# Patient Record
Sex: Female | Born: 1955 | Race: Black or African American | Hispanic: No | Smoking: Never smoker
Health system: Southern US, Community
[De-identification: ages and names within clinical notes are randomized; demographics above are authoritative.]

## PROBLEM LIST (undated history)

## (undated) DIAGNOSIS — T7840XA Allergy, unspecified, initial encounter: Secondary | ICD-10-CM

## (undated) DIAGNOSIS — I219 Acute myocardial infarction, unspecified: Secondary | ICD-10-CM

## (undated) DIAGNOSIS — I509 Heart failure, unspecified: Secondary | ICD-10-CM

## (undated) DIAGNOSIS — T8859XA Other complications of anesthesia, initial encounter: Secondary | ICD-10-CM

## (undated) DIAGNOSIS — G709 Myoneural disorder, unspecified: Secondary | ICD-10-CM

## (undated) DIAGNOSIS — N189 Chronic kidney disease, unspecified: Secondary | ICD-10-CM

## (undated) DIAGNOSIS — I2510 Atherosclerotic heart disease of native coronary artery without angina pectoris: Secondary | ICD-10-CM

## (undated) DIAGNOSIS — D649 Anemia, unspecified: Secondary | ICD-10-CM

## (undated) DIAGNOSIS — I5020 Unspecified systolic (congestive) heart failure: Secondary | ICD-10-CM

## (undated) DIAGNOSIS — E785 Hyperlipidemia, unspecified: Secondary | ICD-10-CM

## (undated) DIAGNOSIS — H269 Unspecified cataract: Secondary | ICD-10-CM

## (undated) DIAGNOSIS — T4145XA Adverse effect of unspecified anesthetic, initial encounter: Secondary | ICD-10-CM

## (undated) DIAGNOSIS — I428 Other cardiomyopathies: Secondary | ICD-10-CM

## (undated) DIAGNOSIS — I739 Peripheral vascular disease, unspecified: Secondary | ICD-10-CM

## (undated) DIAGNOSIS — I499 Cardiac arrhythmia, unspecified: Secondary | ICD-10-CM

## (undated) DIAGNOSIS — I10 Essential (primary) hypertension: Secondary | ICD-10-CM

## (undated) DIAGNOSIS — I82409 Acute embolism and thrombosis of unspecified deep veins of unspecified lower extremity: Secondary | ICD-10-CM

## (undated) DIAGNOSIS — E669 Obesity, unspecified: Secondary | ICD-10-CM

## (undated) HISTORY — DX: Allergy, unspecified, initial encounter: T78.40XA

## (undated) HISTORY — DX: Obesity, unspecified: E66.9

## (undated) HISTORY — PX: OTHER SURGICAL HISTORY: SHX169

## (undated) HISTORY — PX: WISDOM TOOTH EXTRACTION: SHX21

## (undated) HISTORY — DX: Unspecified systolic (congestive) heart failure: I50.20

## (undated) HISTORY — PX: COLONOSCOPY: SHX174

## (undated) HISTORY — PX: NO PAST SURGERIES: SHX2092

## (undated) HISTORY — DX: Atherosclerotic heart disease of native coronary artery without angina pectoris: I25.10

## (undated) HISTORY — DX: Anemia, unspecified: D64.9

## (undated) HISTORY — DX: Hyperlipidemia, unspecified: E78.5

## (undated) HISTORY — DX: Unspecified cataract: H26.9

## (undated) HISTORY — DX: Acute embolism and thrombosis of unspecified deep veins of unspecified lower extremity: I82.409

## (undated) HISTORY — DX: Other cardiomyopathies: I42.8

---

## 2001-12-17 ENCOUNTER — Encounter: Admission: RE | Admit: 2001-12-17 | Discharge: 2001-12-17 | Payer: Self-pay | Admitting: Internal Medicine

## 2001-12-17 ENCOUNTER — Encounter: Payer: Self-pay | Admitting: Internal Medicine

## 2002-01-20 ENCOUNTER — Other Ambulatory Visit: Admission: RE | Admit: 2002-01-20 | Discharge: 2002-01-20 | Payer: Self-pay | Admitting: Obstetrics and Gynecology

## 2002-01-24 ENCOUNTER — Encounter: Admission: RE | Admit: 2002-01-24 | Discharge: 2002-04-24 | Payer: Self-pay | Admitting: Internal Medicine

## 2003-09-12 ENCOUNTER — Encounter: Admission: RE | Admit: 2003-09-12 | Discharge: 2003-09-12 | Payer: Self-pay | Admitting: Internal Medicine

## 2004-04-16 ENCOUNTER — Emergency Department (HOSPITAL_COMMUNITY): Admission: EM | Admit: 2004-04-16 | Discharge: 2004-04-16 | Payer: Self-pay | Admitting: Emergency Medicine

## 2005-11-28 ENCOUNTER — Observation Stay (HOSPITAL_COMMUNITY): Admission: EM | Admit: 2005-11-28 | Discharge: 2005-11-29 | Payer: Self-pay | Admitting: Emergency Medicine

## 2005-11-29 ENCOUNTER — Encounter (INDEPENDENT_AMBULATORY_CARE_PROVIDER_SITE_OTHER): Payer: Self-pay | Admitting: Infectious Diseases

## 2005-12-03 ENCOUNTER — Ambulatory Visit: Payer: Self-pay | Admitting: Family Medicine

## 2007-01-09 ENCOUNTER — Emergency Department (HOSPITAL_COMMUNITY): Admission: EM | Admit: 2007-01-09 | Discharge: 2007-01-09 | Payer: Self-pay | Admitting: Emergency Medicine

## 2007-01-11 ENCOUNTER — Emergency Department (HOSPITAL_COMMUNITY): Admission: EM | Admit: 2007-01-11 | Discharge: 2007-01-11 | Payer: Self-pay | Admitting: Emergency Medicine

## 2007-01-18 ENCOUNTER — Ambulatory Visit: Payer: Self-pay | Admitting: Internal Medicine

## 2007-01-18 DIAGNOSIS — I10 Essential (primary) hypertension: Secondary | ICD-10-CM | POA: Insufficient documentation

## 2007-02-03 ENCOUNTER — Ambulatory Visit (HOSPITAL_COMMUNITY): Admission: RE | Admit: 2007-02-03 | Discharge: 2007-02-03 | Payer: Self-pay | Admitting: Internal Medicine

## 2007-02-03 ENCOUNTER — Encounter (INDEPENDENT_AMBULATORY_CARE_PROVIDER_SITE_OTHER): Payer: Self-pay | Admitting: Infectious Diseases

## 2007-02-03 ENCOUNTER — Ambulatory Visit: Payer: Self-pay | Admitting: Cardiology

## 2007-02-03 ENCOUNTER — Ambulatory Visit: Payer: Self-pay | Admitting: Internal Medicine

## 2007-02-03 ENCOUNTER — Encounter (INDEPENDENT_AMBULATORY_CARE_PROVIDER_SITE_OTHER): Payer: Self-pay | Admitting: Internal Medicine

## 2007-02-04 ENCOUNTER — Encounter (INDEPENDENT_AMBULATORY_CARE_PROVIDER_SITE_OTHER): Payer: Self-pay | Admitting: Infectious Diseases

## 2007-02-04 LAB — CONVERTED CEMR LAB
ALT: 17 units/L (ref 0–35)
AST: 14 units/L (ref 0–37)
Albumin: 4.5 g/dL (ref 3.5–5.2)
Amphetamine Screen, Ur: NEGATIVE
Benzodiazepines.: NEGATIVE
Calcium: 10.3 mg/dL (ref 8.4–10.5)
Chloride: 103 meq/L (ref 96–112)
Creatinine, Ser: 1.39 mg/dL — ABNORMAL HIGH (ref 0.40–1.20)
Creatinine, Urine: 200.9 mg/dL
Creatinine,U: 198.8 mg/dL
Methadone: NEGATIVE
Microalb Creat Ratio: 4.9 mg/g (ref 0.0–30.0)
Potassium: 3.8 meq/L (ref 3.5–5.3)
TSH: 2.809 microintl units/mL (ref 0.350–5.50)
Total CHOL/HDL Ratio: 4.3

## 2007-02-08 ENCOUNTER — Ambulatory Visit: Payer: Self-pay | Admitting: Internal Medicine

## 2007-02-10 ENCOUNTER — Encounter (INDEPENDENT_AMBULATORY_CARE_PROVIDER_SITE_OTHER): Payer: Self-pay | Admitting: Internal Medicine

## 2007-02-10 ENCOUNTER — Ambulatory Visit: Payer: Self-pay | Admitting: Internal Medicine

## 2007-02-11 ENCOUNTER — Encounter: Admission: RE | Admit: 2007-02-11 | Discharge: 2007-02-11 | Payer: Self-pay | Admitting: Internal Medicine

## 2007-02-11 ENCOUNTER — Encounter: Payer: Self-pay | Admitting: Internal Medicine

## 2007-02-12 ENCOUNTER — Encounter (INDEPENDENT_AMBULATORY_CARE_PROVIDER_SITE_OTHER): Payer: Self-pay | Admitting: Internal Medicine

## 2007-02-12 LAB — CONVERTED CEMR LAB
ALT: 16 units/L (ref 0–35)
AST: 16 units/L (ref 0–37)
Albumin: 4.2 g/dL (ref 3.5–5.2)
Alkaline Phosphatase: 71 units/L (ref 39–117)
Glucose, Bld: 150 mg/dL — ABNORMAL HIGH (ref 70–99)
Potassium: 3.9 meq/L (ref 3.5–5.3)
Sodium: 138 meq/L (ref 135–145)
Total Protein: 7.2 g/dL (ref 6.0–8.3)

## 2007-02-22 ENCOUNTER — Ambulatory Visit: Payer: Self-pay | Admitting: Internal Medicine

## 2007-03-18 ENCOUNTER — Telehealth (INDEPENDENT_AMBULATORY_CARE_PROVIDER_SITE_OTHER): Payer: Self-pay | Admitting: *Deleted

## 2007-03-25 ENCOUNTER — Encounter (INDEPENDENT_AMBULATORY_CARE_PROVIDER_SITE_OTHER): Payer: Self-pay | Admitting: *Deleted

## 2007-03-25 ENCOUNTER — Ambulatory Visit: Payer: Self-pay | Admitting: Internal Medicine

## 2007-03-25 LAB — CONVERTED CEMR LAB
Albumin: 4.4 g/dL (ref 3.5–5.2)
BUN: 23 mg/dL (ref 6–23)
CO2: 26 meq/L (ref 19–32)
Calcium: 9.3 mg/dL (ref 8.4–10.5)
Chloride: 104 meq/L (ref 96–112)
Glucose, Bld: 128 mg/dL — ABNORMAL HIGH (ref 70–99)
HDL: 55 mg/dL (ref >39)
Hgb A1c MFr Bld: 7.7 %
Potassium: 4 meq/L (ref 3.5–5.3)
Total Protein: 7.2 g/dL (ref 6.0–8.3)
Triglycerides: 92 mg/dL (ref <150)

## 2007-12-09 ENCOUNTER — Ambulatory Visit: Payer: Self-pay | Admitting: Cardiology

## 2007-12-09 ENCOUNTER — Inpatient Hospital Stay (HOSPITAL_COMMUNITY): Admission: EM | Admit: 2007-12-09 | Discharge: 2007-12-10 | Payer: Self-pay | Admitting: Emergency Medicine

## 2007-12-09 ENCOUNTER — Ambulatory Visit: Payer: Self-pay | Admitting: *Deleted

## 2007-12-09 ENCOUNTER — Encounter (INDEPENDENT_AMBULATORY_CARE_PROVIDER_SITE_OTHER): Payer: Self-pay | Admitting: *Deleted

## 2007-12-09 LAB — CONVERTED CEMR LAB
Cholesterol: 177 mg/dL
Hgb A1c MFr Bld: 7.5 %
LDL Cholesterol: 112 mg/dL

## 2007-12-10 ENCOUNTER — Encounter (INDEPENDENT_AMBULATORY_CARE_PROVIDER_SITE_OTHER): Payer: Self-pay | Admitting: Emergency Medicine

## 2007-12-10 ENCOUNTER — Ambulatory Visit: Payer: Self-pay | Admitting: Vascular Surgery

## 2007-12-10 ENCOUNTER — Encounter (INDEPENDENT_AMBULATORY_CARE_PROVIDER_SITE_OTHER): Payer: Self-pay | Admitting: Internal Medicine

## 2007-12-20 ENCOUNTER — Encounter (INDEPENDENT_AMBULATORY_CARE_PROVIDER_SITE_OTHER): Payer: Self-pay | Admitting: *Deleted

## 2007-12-20 ENCOUNTER — Ambulatory Visit: Payer: Self-pay | Admitting: Internal Medicine

## 2007-12-20 LAB — CONVERTED CEMR LAB
ALT: 40 units/L — ABNORMAL HIGH (ref 0–35)
AST: 27 units/L (ref 0–37)
BUN: 23 mg/dL (ref 6–23)
CO2: 26 meq/L (ref 19–32)
Chloride: 103 meq/L (ref 96–112)
Creatinine, Ser: 1.33 mg/dL — ABNORMAL HIGH (ref 0.40–1.20)
Indirect Bilirubin: 0.7 mg/dL (ref 0.0–0.9)
Potassium: 3.7 meq/L (ref 3.5–5.3)
Total Protein: 6.3 g/dL (ref 6.0–8.3)

## 2007-12-22 ENCOUNTER — Encounter (INDEPENDENT_AMBULATORY_CARE_PROVIDER_SITE_OTHER): Payer: Self-pay | Admitting: *Deleted

## 2007-12-22 ENCOUNTER — Telehealth (INDEPENDENT_AMBULATORY_CARE_PROVIDER_SITE_OTHER): Payer: Self-pay | Admitting: *Deleted

## 2008-02-08 ENCOUNTER — Ambulatory Visit: Payer: Self-pay | Admitting: Internal Medicine

## 2008-02-08 LAB — CONVERTED CEMR LAB: Blood Glucose, Fingerstick: 88

## 2009-02-18 IMAGING — CR DG CHEST 2V
2 series · 2 of 2 positions shown · non-contrast
Comparison: None

CLINICAL DATA: Shortness of breath. Coughing.

CHEST - 2 VIEW

[w chest pa]
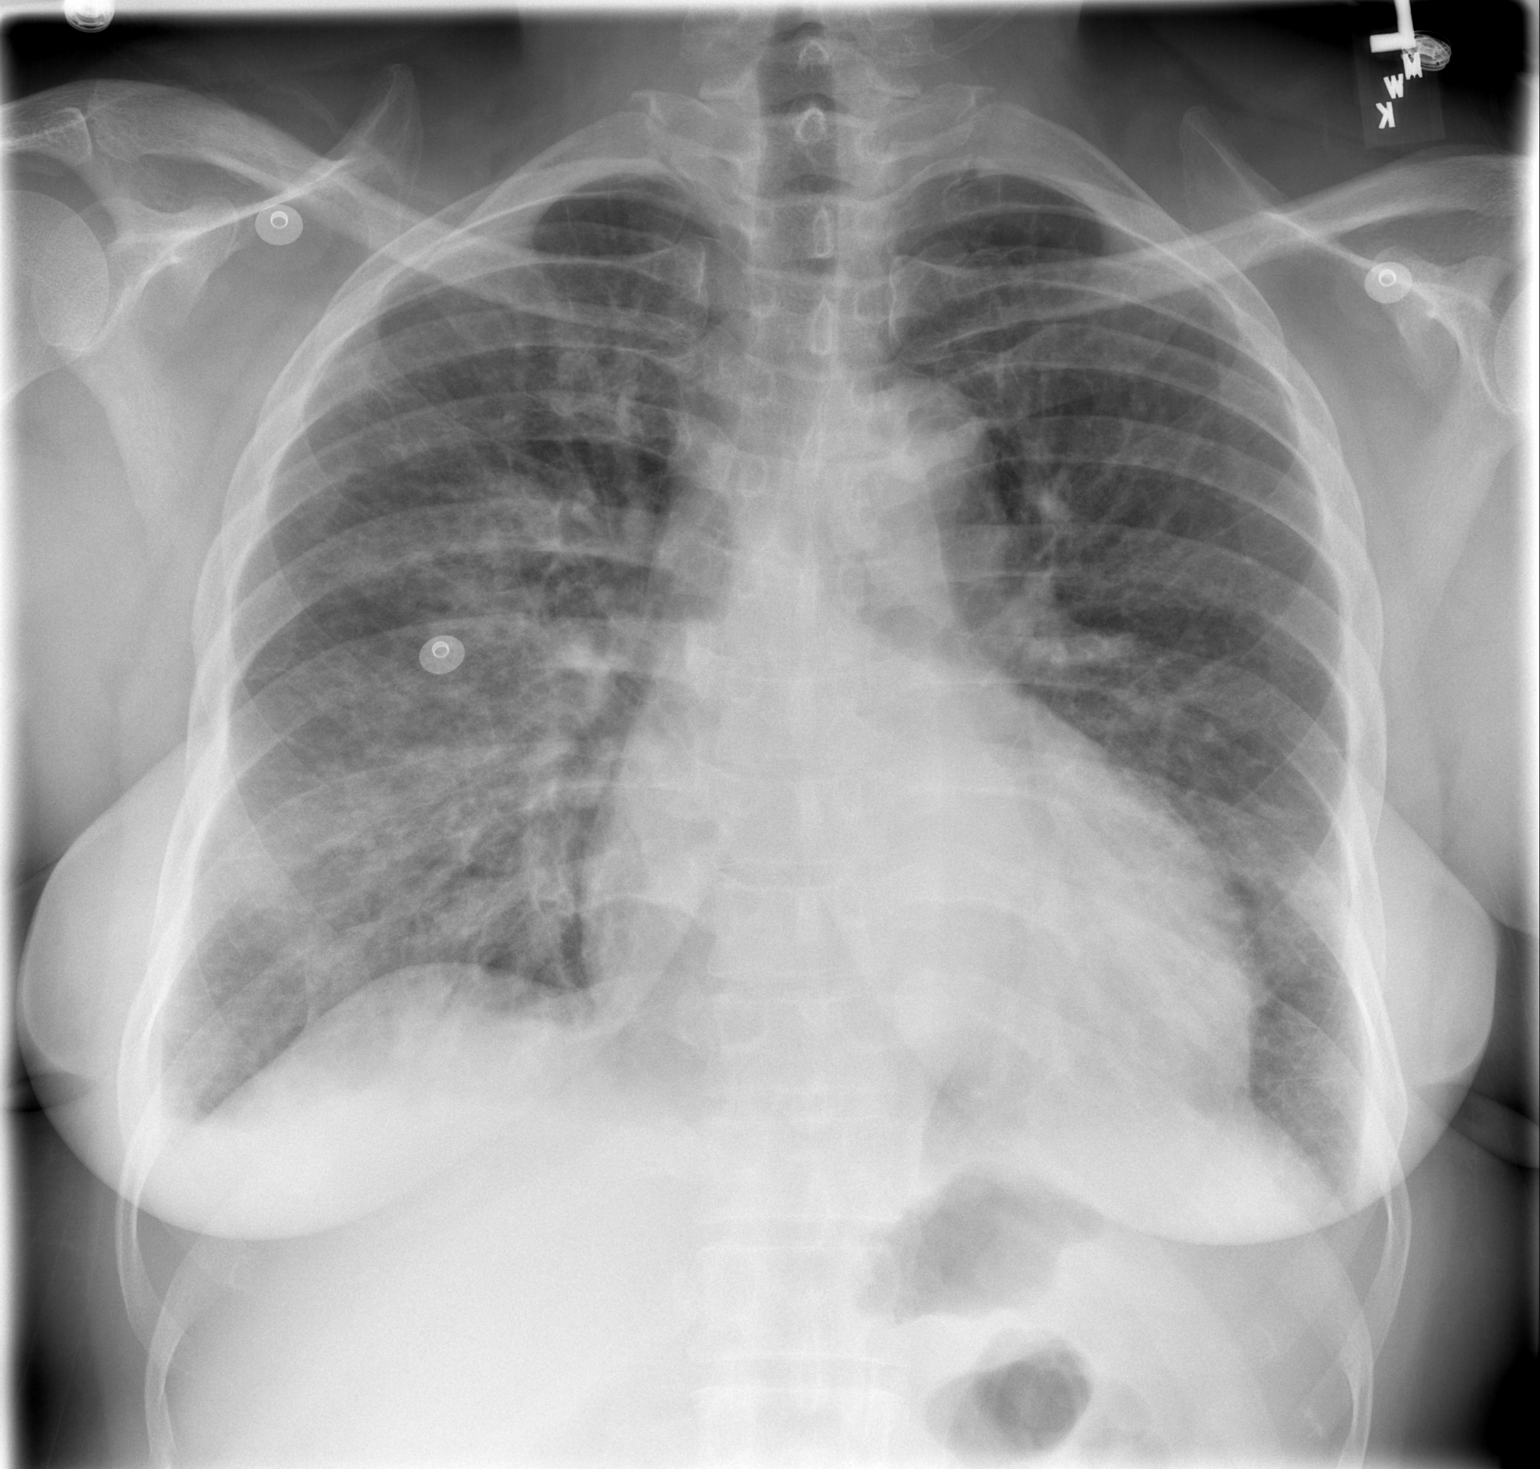

[w chest lat]
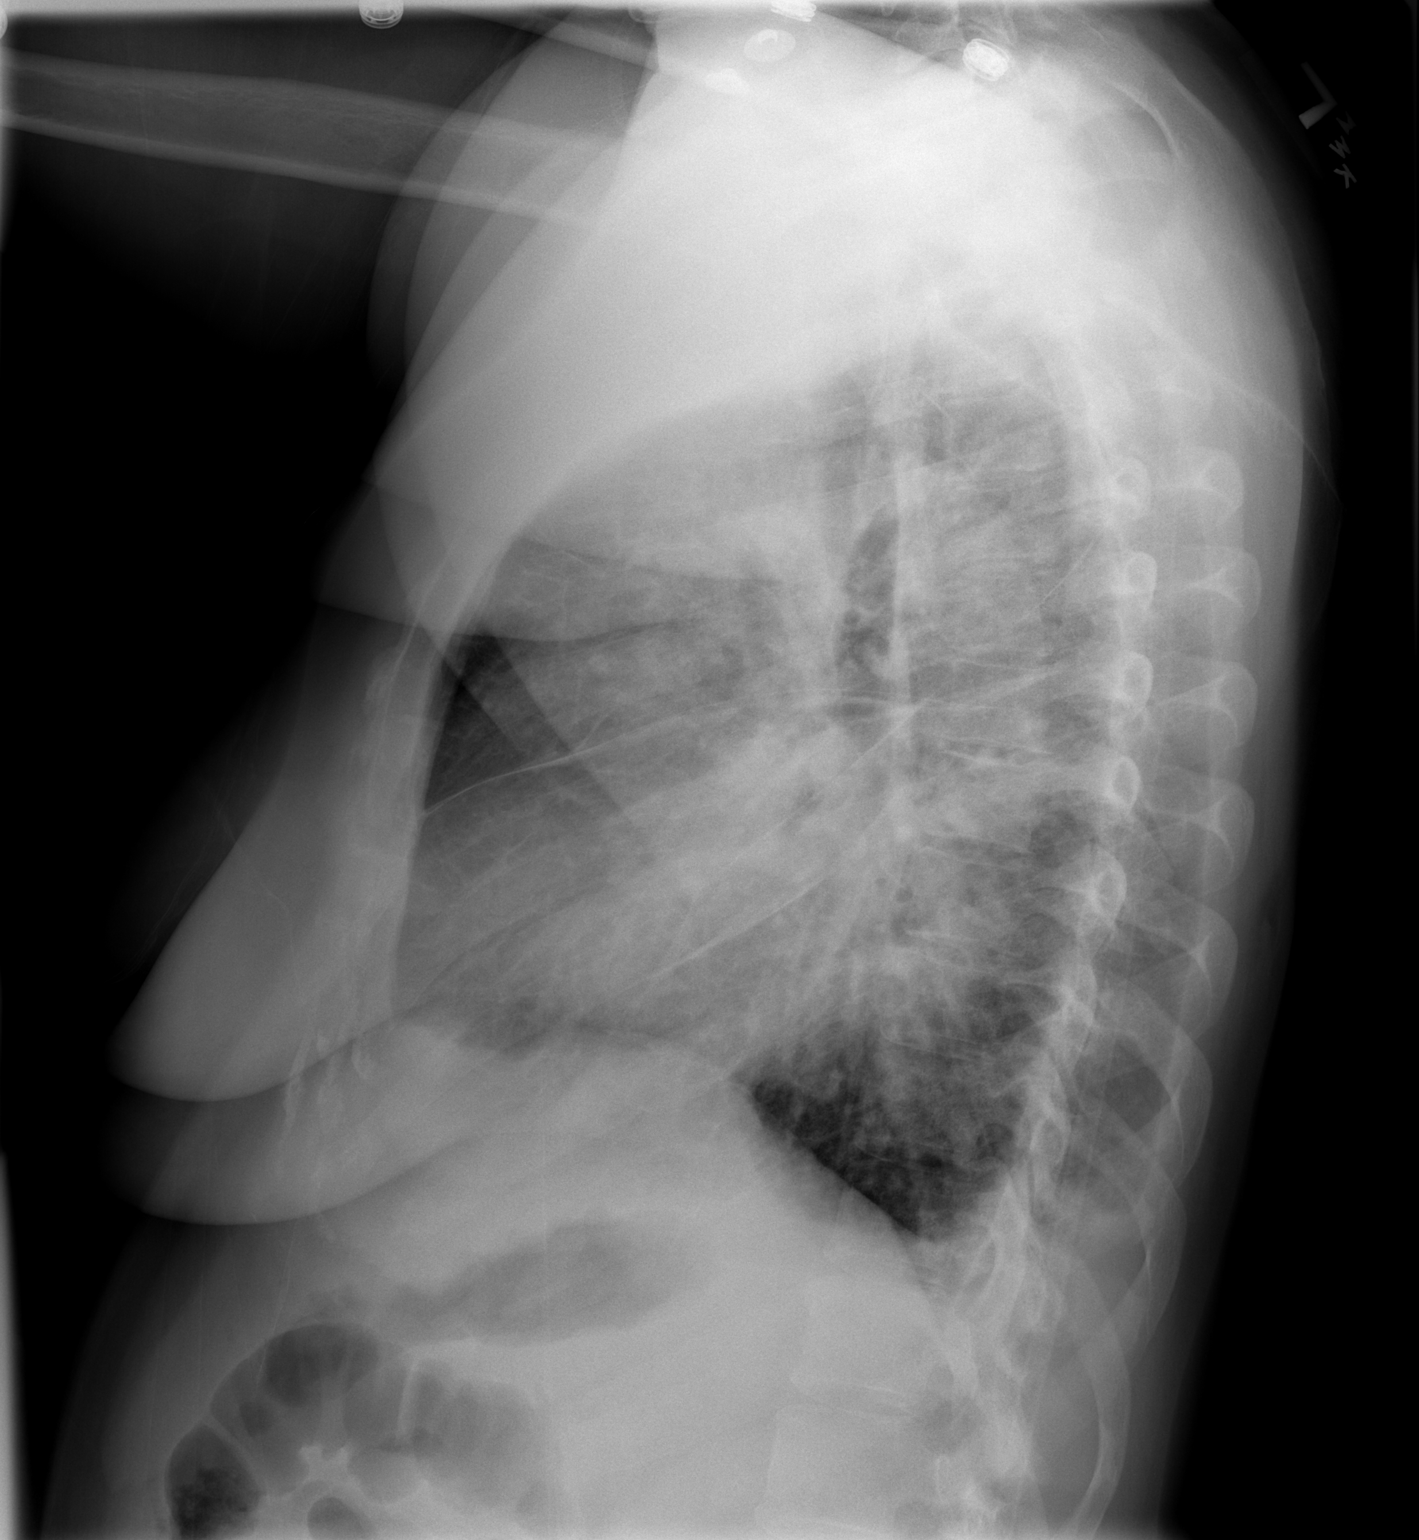

[2 of 2 positions shown; findings below may reference images not displayed]

FINDINGS: Cardiomegaly is present along with bilateral interstitial opacities
and right perihilar and left basilar air space opacities. A small right pleural
effusion is present. Kerley B-lines are noted. The appearance favors acute
pulmonary edema over bilateral pneumonia.

IMPRESSION

1. Acute pulmonary edema.  Small right pleural effusion.

## 2009-05-17 ENCOUNTER — Emergency Department (HOSPITAL_COMMUNITY): Admission: EM | Admit: 2009-05-17 | Discharge: 2009-05-17 | Payer: Self-pay | Admitting: Emergency Medicine

## 2009-08-27 ENCOUNTER — Encounter: Payer: Self-pay | Admitting: Internal Medicine

## 2010-09-17 NOTE — Miscellaneous (Signed)
Summary: [employer]: Diabetes [employer]: Diabetes Testing Supplies   Imported By: Florinda Marker 08/28/2009 13:52:46  _____________________________________________________________________  External Attachment:    Type:   Image     Comment:   External Document

## 2010-09-29 ENCOUNTER — Emergency Department (HOSPITAL_COMMUNITY): Payer: Managed Care, Other (non HMO)

## 2010-09-29 ENCOUNTER — Emergency Department (HOSPITAL_COMMUNITY)
Admission: EM | Admit: 2010-09-29 | Discharge: 2010-09-30 | Disposition: A | Discharge status: Home / Self Care | Payer: Managed Care, Other (non HMO) | Attending: Emergency Medicine | Admitting: Emergency Medicine

## 2010-09-29 DIAGNOSIS — R062 Wheezing: Secondary | ICD-10-CM | POA: Insufficient documentation

## 2010-09-29 DIAGNOSIS — I509 Heart failure, unspecified: Secondary | ICD-10-CM | POA: Insufficient documentation

## 2010-09-29 DIAGNOSIS — J209 Acute bronchitis, unspecified: Secondary | ICD-10-CM | POA: Insufficient documentation

## 2010-09-29 DIAGNOSIS — E119 Type 2 diabetes mellitus without complications: Secondary | ICD-10-CM | POA: Insufficient documentation

## 2010-09-29 DIAGNOSIS — I10 Essential (primary) hypertension: Secondary | ICD-10-CM | POA: Insufficient documentation

## 2010-09-29 DIAGNOSIS — R0602 Shortness of breath: Secondary | ICD-10-CM | POA: Insufficient documentation

## 2010-09-29 DIAGNOSIS — E785 Hyperlipidemia, unspecified: Secondary | ICD-10-CM | POA: Insufficient documentation

## 2010-09-30 ENCOUNTER — Emergency Department (HOSPITAL_COMMUNITY): Payer: Managed Care, Other (non HMO)

## 2010-09-30 ENCOUNTER — Inpatient Hospital Stay (HOSPITAL_COMMUNITY)
Admission: EM | Admit: 2010-09-30 | Discharge: 2010-10-04 | DRG: 280 | Disposition: A | Discharge status: Home / Self Care | Payer: Managed Care, Other (non HMO) | Attending: Internal Medicine | Admitting: Internal Medicine

## 2010-09-30 ENCOUNTER — Encounter: Payer: Self-pay | Admitting: Internal Medicine

## 2010-09-30 ENCOUNTER — Encounter (HOSPITAL_COMMUNITY): Payer: Self-pay | Admitting: Radiology

## 2010-09-30 DIAGNOSIS — N289 Disorder of kidney and ureter, unspecified: Secondary | ICD-10-CM | POA: Diagnosis not present

## 2010-09-30 DIAGNOSIS — I5023 Acute on chronic systolic (congestive) heart failure: Secondary | ICD-10-CM | POA: Diagnosis present

## 2010-09-30 DIAGNOSIS — Z7982 Long term (current) use of aspirin: Secondary | ICD-10-CM

## 2010-09-30 DIAGNOSIS — I428 Other cardiomyopathies: Secondary | ICD-10-CM | POA: Diagnosis present

## 2010-09-30 DIAGNOSIS — R079 Chest pain, unspecified: Secondary | ICD-10-CM

## 2010-09-30 DIAGNOSIS — I2582 Chronic total occlusion of coronary artery: Secondary | ICD-10-CM | POA: Diagnosis present

## 2010-09-30 DIAGNOSIS — E119 Type 2 diabetes mellitus without complications: Secondary | ICD-10-CM | POA: Diagnosis present

## 2010-09-30 DIAGNOSIS — E785 Hyperlipidemia, unspecified: Secondary | ICD-10-CM | POA: Diagnosis present

## 2010-09-30 DIAGNOSIS — I509 Heart failure, unspecified: Secondary | ICD-10-CM | POA: Diagnosis present

## 2010-09-30 DIAGNOSIS — I214 Non-ST elevation (NSTEMI) myocardial infarction: Principal | ICD-10-CM | POA: Diagnosis present

## 2010-09-30 DIAGNOSIS — I110 Hypertensive heart disease with heart failure: Secondary | ICD-10-CM | POA: Diagnosis present

## 2010-09-30 DIAGNOSIS — I2510 Atherosclerotic heart disease of native coronary artery without angina pectoris: Secondary | ICD-10-CM | POA: Diagnosis present

## 2010-09-30 DIAGNOSIS — E669 Obesity, unspecified: Secondary | ICD-10-CM | POA: Diagnosis present

## 2010-09-30 DIAGNOSIS — Z79899 Other long term (current) drug therapy: Secondary | ICD-10-CM

## 2010-09-30 DIAGNOSIS — R0602 Shortness of breath: Secondary | ICD-10-CM

## 2010-09-30 HISTORY — DX: Essential (primary) hypertension: I10

## 2010-09-30 HISTORY — DX: Heart failure, unspecified: I50.9

## 2010-09-30 LAB — URINALYSIS, ROUTINE W REFLEX MICROSCOPIC
Bilirubin Urine: NEGATIVE
Hgb urine dipstick: NEGATIVE
Protein, ur: NEGATIVE mg/dL
Urobilinogen, UA: 0.2 mg/dL (ref 0.0–1.0)

## 2010-09-30 LAB — COMPREHENSIVE METABOLIC PANEL
ALT: 47 U/L — ABNORMAL HIGH (ref 0–35)
AST: 61 U/L — ABNORMAL HIGH (ref 0–37)
CO2: 26 mEq/L (ref 19–32)
Chloride: 104 mEq/L (ref 96–112)
Creatinine, Ser: 1.23 mg/dL — ABNORMAL HIGH (ref 0.4–1.2)
GFR calc Af Amer: 55 mL/min — ABNORMAL LOW (ref >60)
GFR calc non Af Amer: 46 mL/min — ABNORMAL LOW (ref >60)
Sodium: 141 mEq/L (ref 135–145)
Total Bilirubin: 0.8 mg/dL (ref 0.3–1.2)

## 2010-09-30 LAB — BASIC METABOLIC PANEL
BUN: 17 mg/dL (ref 6–23)
CO2: 25 mEq/L (ref 19–32)
Chloride: 105 mEq/L (ref 96–112)
GFR calc non Af Amer: 41 mL/min — ABNORMAL LOW (ref >60)
Glucose, Bld: 175 mg/dL — ABNORMAL HIGH (ref 70–99)
Potassium: 3.6 mEq/L (ref 3.5–5.1)
Sodium: 136 mEq/L (ref 135–145)

## 2010-09-30 LAB — CK TOTAL AND CKMB (NOT AT ARMC)
CK, MB: 5.2 ng/mL — ABNORMAL HIGH (ref 0.3–4.0)
Relative Index: INVALID (ref 0.0–2.5)
Total CK: 91 U/L (ref 7–177)

## 2010-09-30 LAB — CBC
HCT: 43.6 % (ref 36.0–46.0)
Hemoglobin: 15 g/dL (ref 12.0–15.0)
MCV: 84.8 fL (ref 78.0–100.0)
RDW: 13.6 % (ref 11.5–15.5)
WBC: 6.9 10*3/uL (ref 4.0–10.5)

## 2010-09-30 LAB — DIFFERENTIAL
Basophils Absolute: 0.1 10*3/uL (ref 0.0–0.1)
Eosinophils Relative: 3 % (ref 0–5)
Lymphocytes Relative: 27 % (ref 12–46)
Lymphs Abs: 1.9 10*3/uL (ref 0.7–4.0)
Neutro Abs: 4.4 10*3/uL (ref 1.7–7.7)

## 2010-09-30 LAB — CARDIAC PANEL(CRET KIN+CKTOT+MB+TROPI): Relative Index: 8.5 — ABNORMAL HIGH (ref 0.0–2.5)

## 2010-09-30 LAB — TROPONIN I: Troponin I: 0.28 ng/mL — ABNORMAL HIGH (ref 0.00–0.06)

## 2010-09-30 MED ORDER — IOHEXOL 300 MG/ML  SOLN
100.0000 mL | Freq: Once | INTRAMUSCULAR | Status: AC | PRN
  Administered 2010-09-30: 100 mL via INTRAVENOUS

## 2010-10-01 DIAGNOSIS — I2510 Atherosclerotic heart disease of native coronary artery without angina pectoris: Secondary | ICD-10-CM

## 2010-10-01 LAB — HEMOGLOBIN A1C: Hgb A1c MFr Bld: 7.5 % — ABNORMAL HIGH (ref <5.7)

## 2010-10-01 LAB — GLUCOSE, CAPILLARY
Glucose-Capillary: 198 mg/dL — ABNORMAL HIGH (ref 70–99)
Glucose-Capillary: 202 mg/dL — ABNORMAL HIGH (ref 70–99)
Glucose-Capillary: 145 mg/dL — ABNORMAL HIGH (ref 70–99)
Glucose-Capillary: 204 mg/dL — ABNORMAL HIGH (ref 70–99)
Glucose-Capillary: 139 mg/dL — ABNORMAL HIGH (ref 70–99)

## 2010-10-01 LAB — CBC
MCV: 84.8 fL (ref 78.0–100.0)
Platelets: 212 10*3/uL (ref 150–400)
RDW: 13.9 % (ref 11.5–15.5)
WBC: 12.3 10*3/uL — ABNORMAL HIGH (ref 4.0–10.5)

## 2010-10-01 LAB — CARDIAC PANEL(CRET KIN+CKTOT+MB+TROPI)
CK, MB: 23.2 ng/mL (ref 0.3–4.0)
Relative Index: 6.3 — ABNORMAL HIGH (ref 0.0–2.5)
Troponin I: 5.56 ng/mL (ref 0.00–0.06)

## 2010-10-01 LAB — BASIC METABOLIC PANEL
Calcium: 9.4 mg/dL (ref 8.4–10.5)
GFR calc Af Amer: >60 mL/min (ref >60)
GFR calc non Af Amer: 51 mL/min — ABNORMAL LOW (ref >60)
Glucose, Bld: 202 mg/dL — ABNORMAL HIGH (ref 70–99)
Sodium: 140 mEq/L (ref 135–145)

## 2010-10-01 LAB — LIPID PANEL: Triglycerides: 96 mg/dL (ref <150)

## 2010-10-02 DIAGNOSIS — I517 Cardiomegaly: Secondary | ICD-10-CM

## 2010-10-02 DIAGNOSIS — I214 Non-ST elevation (NSTEMI) myocardial infarction: Secondary | ICD-10-CM

## 2010-10-02 LAB — GLUCOSE, CAPILLARY
Glucose-Capillary: 165 mg/dL — ABNORMAL HIGH (ref 70–99)
Glucose-Capillary: 212 mg/dL — ABNORMAL HIGH (ref 70–99)
Glucose-Capillary: 141 mg/dL — ABNORMAL HIGH (ref 70–99)

## 2010-10-02 LAB — BASIC METABOLIC PANEL
CO2: 26 mEq/L (ref 19–32)
Chloride: 103 mEq/L (ref 96–112)
GFR calc Af Amer: 57 mL/min — ABNORMAL LOW (ref >60)
Potassium: 3.6 mEq/L (ref 3.5–5.1)
Sodium: 137 mEq/L (ref 135–145)

## 2010-10-02 LAB — CBC
Hemoglobin: 13.7 g/dL (ref 12.0–15.0)
MCH: 28.6 pg (ref 26.0–34.0)
RBC: 4.79 MIL/uL (ref 3.87–5.11)
WBC: 8.8 10*3/uL (ref 4.0–10.5)

## 2010-10-02 LAB — TSH: TSH: 1.962 u[IU]/mL (ref 0.350–4.500)

## 2010-10-02 NOTE — Procedures (Signed)
  Brittany Evans, Brittany Evans NO.:  [id number]  MEDICAL RECORD NO.:  [id number]           PATIENT TYPE:  I  LOCATION:  2918                         FACILITY:  [facility]  PHYSICIAN:  Shamone Winzer M. [place], M.D.  DATE OF BIRTH:  03/14/1956  DATE OF PROCEDURE:  10/01/2010 DATE OF DISCHARGE:                           CARDIAC CATHETERIZATION   INDICATIONS FOR PROCEDURE:  This is a 55 year old African American female who presents with non-ST-elevation MI.  She also has congestive heart failure.  She has multiple cardiac risk factors including hypertension and diabetes.  PROCEDURES:  Left heart catheterization, coronary and left ventricular angiography.  ACCESS:  Via the right radial artery using the standard Seldinger technique.  EQUIPMENT:  5-French 4 cm right Judkins catheter, 5-French 3.5-cm left Judkins catheter, 5-French pigtail catheter, 5-French arterial sheath.  MEDICATIONS:  Local anesthesia 1% Xylocaine, Versed 1 mg IV, fentanyl 25 mcg IV, verapamil 3 mg intraarterial, heparin 4000 units IV.  CONTRAST:  90 mL of Omnipaque.  HEMODYNAMIC DATA:  Aortic pressure is 141/101 with a mean of 119.  Left ventricular pressure is 137 with EDP of 34 mmHg.  ANGIOGRAPHIC DATA:  The right coronary artery arises and distributes normally.  It is a dominant vessel.  It has mild irregularities in the proximal and mid vessel up to 10-20%.  The left main coronary artery is normal.  The left anterior descending artery has 30% narrowing in the proximal vessel.  The LAD is occluded at the apex distally.  The distal LAD is small in caliber.  There is a first diagonal branch which has a 40% lesion at the origin.  The left circumflex coronary artery is a large vessel that gives rise to a large bifurcating marginal branch.  There is a 70-80% stenosis at the ostium of the left circumflex.  Otherwise, there is no obstructive disease.  Left ventricular angiography was performed in the RAO  view.  This demonstrates enlarged left ventricular chamber size with severe global hypokinesia and apical akinesia.  Ejection fraction is estimated at 20- 25%.  There is some modest mitral insufficiency.  FINAL INTERPRETATION: 1. Two-vessel obstructive atherosclerotic coronary artery disease.     There is occlusion of the very distal LAD and     a 70-80% stenosis of the ostium of the left circumflex. 2. Severe left ventricular dysfunction.  PLAN:  We would recommend aggressive medical therapy.          ______________________________ Prerna Harold M. [place], M.D.     PMJ/MEDQ  D:  10/01/2010  T:  10/02/2010  Job:  [id number]  cc:   Bevelyn Buckles. Bensimhon, MD  Electronically Signed by Vianka Ertel [place] M.D. on 10/02/2010 11:20:16 AM

## 2010-10-03 ENCOUNTER — Inpatient Hospital Stay (HOSPITAL_COMMUNITY): Payer: Managed Care, Other (non HMO)

## 2010-10-03 LAB — GLUCOSE, CAPILLARY
Glucose-Capillary: 175 mg/dL — ABNORMAL HIGH (ref 70–99)
Glucose-Capillary: 144 mg/dL — ABNORMAL HIGH (ref 70–99)

## 2010-10-03 LAB — BASIC METABOLIC PANEL
CO2: 25 mEq/L (ref 19–32)
Calcium: 9 mg/dL (ref 8.4–10.5)
Glucose, Bld: 143 mg/dL — ABNORMAL HIGH (ref 70–99)
Sodium: 138 mEq/L (ref 135–145)

## 2010-10-04 LAB — BASIC METABOLIC PANEL
BUN: 23 mg/dL (ref 6–23)
Calcium: 9.1 mg/dL (ref 8.4–10.5)
GFR calc non Af Amer: 45 mL/min — ABNORMAL LOW (ref >60)
Glucose, Bld: 141 mg/dL — ABNORMAL HIGH (ref 70–99)
Sodium: 139 mEq/L (ref 135–145)

## 2010-10-04 LAB — GLUCOSE, CAPILLARY: Glucose-Capillary: 201 mg/dL — ABNORMAL HIGH (ref 70–99)

## 2010-10-09 ENCOUNTER — Telehealth: Payer: Self-pay | Admitting: Internal Medicine

## 2010-10-09 NOTE — Discharge Summary (Addendum)
NAMEARLENIS, BLAYDES           ACCOUNT NO.:  [id number]  MEDICAL RECORD NO.:  [id number]           PATIENT TYPE:  I  LOCATION:  3735                         FACILITY:  [facility]  PHYSICIAN:  Metztli Sachdev C. Azam Gervasi, MD, FACCDATE OF BIRTH:  March 09, 1956  DATE OF ADMISSION:  09/30/2010 DATE OF DISCHARGE:  10/04/2010                              DISCHARGE SUMMARY   DISCHARGE DIAGNOSES: 1. Non-ST elevation myocardial infarction with a peak troponin of     5.56. 2. Newly diagnosed coronary artery disease by cath, October 02, 2010,     with total occlusion of the very distal left anterior descending     coronary artery and 70-80% stenosis of the ostium of left     circumflex, for medical therapy. 3. Acute-on-chronic congestive heart failure with an ejection fraction     of 20-25% by cath on October 02, 2010, and 30-35% by echo on     October 02, 2010, felt suspected mostly nonischemic secondary to     hypertension.     a.     The patient had a prior ejection fraction of 25% by echo in      2009, and was felt to be secondary to hypertensive disease, but no      cath was performed at that time.     b.     Discharge weight of 97.5 kg.     c.     May be consideration of Electrophysiology eval/automatic      implantable cardioverter-defibrillator placement in future. 4. Hypertension. 5. Hyperlipidemia with cholesterol of 212, triglycerides 96, HDL 63,     and LDL 130. 6. Diabetes mellitus, for outpatient followup. 7. Acute renal insufficiency with a creatinine of 1.23 on admission     and at discharge 1.25 with creatinine clearance of 60.  HOSPITAL COURSE:  Ms. Kettlewell is a 55 year old female with past medical history that includes hypertension, dyslipidemia, and diabetes as well as CHF in 2009.  This was detailed at that time secondary to hypertensive urgency, although it appeared that she did not have cardiovascular followup afterward.  She has not been taking any of her medicines in the  last 2 years secondary to financial problems.  She presented to the ER with complaints of shortness of breath.  In the ER, she was treated with bronchodilators and prednisone and Lasix and was discharged home with treatment for acute bronchitis.  She returned to the ER again the same day with severe chest and back pain, shortness of breath, and nausea.  Her initial cardiac enzymes showed a mildly high troponin of 0.28 without any EKG changes.  She was given for aspirin, Zofran, and Ativan which relieved her symptoms.  Cardiology was consulted for admission to the hospital.  Her enzymes were cycled which did reveal ruling in for a true NSTEMI with a troponin of 5.52.  BNP was 233 on admission and initial chest x-ray suggested diffused bilateral airspace disease and cardiomegaly consistent with congestive heart failure.  By the time, she was seen in the ER by Cardiology, she was felt to be more compensated and not volume overloaded.  She had been started  on heparin and nitroglycerin drip by the ER.  Blood pressure was in the range of 190 to 206 over 110 to 120.  She was started on aspirin, Crestor, and Coreg.  Nitroglycerin drip was continued and also for blood pressure control.  She did have some renal insufficiency with creatinine bumped at 1.23 on admission and therefore ACE inhibitor was held.  D- dimer was elevated, but CT angio showed no evidence of pulmonary embolus.  She was somewhat tachycardic during her admission and Dr. Gala Romney felt concerned for LV dysfunction.  He started hydralazine and a 2-D echocardiogram confirmed depressed EF, which was previously known. EF was estimated at 30-35% for global mild hypokinesis.  There was grade 2 diastolic dysfunction.  She underwent cardiac catheterization October 02, 2010 which demonstrated a totally occluded distal LAD as well as 70- 80% ostial circumflex lesion.  The decision was made to treat her coronary artery disease medically and  Dr. Gala Romney suggested reevaluating her left circumflex down the road.  On that day, she was also given 20 mg of IV Lasix x1, spironolactone and digoxin were started.  Imdur was added as well.  She was felt breathing better and therefore transferred to telemetry.  On October 02, 2010, she did not require any further Lasix and it was felt that her ACE inhibitor and beta-blocker to be titrated as an outpatient.  She had been started on Elavil b.i.d.  On day of discharge, the patient is feeling well without shortness of breath or chest pain.  She does have a slight headache which is now possibly secondary to the nitrate and Dr. Daleen Squibb feels that if it continues as an outpatient, then her Imdur can be discontinued. He has seen and examined her and feels she is stable for discharge.  DISCHARGE LABORATORY DATA:  WBC 8.8, hemoglobin 13.7, hematocrit 40.9, platelet count 200, sodium 139, potassium 2.7, chloride 105, CO2 of 25, glucose 141, BUN 23, creatinine 1.25.  BNP was 77.  TSH 1.962.  Total cholesterol was 212, triglycerides 96, HDL 50, LDL 130,  STUDIES: 1. Chest x-ray September 30, 2010, demonstrated diffuse bilateral     airspace disease and cardiomegaly most consistent with congestive     heart failure.  Pneumonia is felt less likely. 2. CT angio of the chest, September 30, 2010, showed no evidence of     pulmonary embolus.  Cardiomegaly, vascular congestion, early     interstitial edema.  Scattered coronary artery calcification.     Cholelithiasis. 3. A 2-D echocardiogram, October 02, 2010, demonstrated an EF of 30-     35% of global mild hypokinesis.  The apical anterior Laurella Tull through     apex, apical inferior Katia Hannen, akinetic.  Features were consistent     with pseudonormal left ventricular filling pattern with concomitant     abnormal relaxation and increased filling pressure with grade 2     diastolic dysfunction.  There was trivial mitral regurgitation.     Mild LVH.  Systolic RV  function was normal.  No complete TR, unable     to estimate PA systolic pressure. 4. Cardiac catheterization, October 02, 2010, showing two-vessel     obstructive coronary artery disease with occlusion in the very     distal LAD and 78-80% stenosis of the ostium of the left     circumflex.  Severe LV dysfunction with EF of 20-25% with moderate     mitral insufficiency.  Recommendation was to continue aggressive     medical therapy.  Please see full report for other details.  DISCHARGE MEDICATIONS: 1. Aspirin 325 mg daily. 2. Coreg 25 mg b.i.d. 3. Digoxin 0.25 mg daily. 4. Enalapril 2.5 mg b.i.d. 5. Hydralazine 25 mg every 8 hours. 6. Imdur 30 mg daily with instructions to let our office know if she     continues to have headaches, so she may be told to discontinue this     medicine. 7. Nitroglycerin sublingual 0.4 mg every 5 minutes as needed up to     three doses. 8. Crestor 40 mg bedtime. 9. Spironolactone 25 mg daily.  Please note, I discussed the addition of Plavix to the patient's medication regimen with Dr. Daleen Squibb, who did not feel that it should be added.  We also deferred the addition of diabetic medication to her primary care provider, she is instructed to follow up with him.  DISPOSITION:  Hoel will be discharged in stable condition to home. She is instructed not to return to work until cleared by Dr. Gala Romney. She is not to lift anything for 1 week or participate in sexual activity for 1 week or drive for 2 days.  She is to follow a low-salt, heart- healthy diabetic diet and to call or return if she notices any pain, swelling, bleeding, or pus at her cath site.  She is to follow up with Dr. Gala Romney on October 21, 2010, at 2:15 p.m.  She is also instructed to follow up with her primary care provider to establish management of her diabetes including medications and monitoring.  The patient previously had difficulty with financial assistance in compliance, but  initial admission note indicates that her new job will allow her to be more compliant.  She also may need consideration of AICD placement and evaluation by Electrophysiology and this can be discussed with the patient, Dr. Gala Romney under followup visit.  DURATION OF DISCHARGE ENCOUNTER:  Greater than 30 minutes including physician PA time.     Dayna Dunn, P.A.C.   ______________________________ Jesse Sans Daleen Squibb, MD, [facility]    DD/MEDQ  D:  10/04/2010  T:  10/04/2010  Job:  [id number]  cc:   Bevelyn Buckles. Bensimhon, MD  Electronically Signed by Ronie Spies  on 10/09/2010 09:16:06 AM Electronically Signed by Valera Castle MD [facility] on 10/16/2010 08:28:47 AM

## 2010-10-10 ENCOUNTER — Telehealth (INDEPENDENT_AMBULATORY_CARE_PROVIDER_SITE_OTHER): Payer: Self-pay | Admitting: *Deleted

## 2010-10-15 NOTE — Progress Notes (Signed)
  Pt Dropped Off FMLA papers,did not get Packet completed ( this fmal was left at front desk with Glenna,she did not know to get pt to complete packet) sent to [facility]  October 10, 2010 1:45 PM

## 2010-10-18 DIAGNOSIS — I2510 Atherosclerotic heart disease of native coronary artery without angina pectoris: Secondary | ICD-10-CM | POA: Insufficient documentation

## 2010-10-21 ENCOUNTER — Encounter: Payer: Self-pay | Admitting: Internal Medicine

## 2010-10-21 ENCOUNTER — Encounter (INDEPENDENT_AMBULATORY_CARE_PROVIDER_SITE_OTHER): Payer: Managed Care, Other (non HMO) | Admitting: Internal Medicine

## 2010-10-21 ENCOUNTER — Other Ambulatory Visit: Payer: Self-pay | Admitting: Internal Medicine

## 2010-10-21 DIAGNOSIS — I5022 Chronic systolic (congestive) heart failure: Secondary | ICD-10-CM

## 2010-10-21 DIAGNOSIS — I2510 Atherosclerotic heart disease of native coronary artery without angina pectoris: Secondary | ICD-10-CM

## 2010-10-21 DIAGNOSIS — R0989 Other specified symptoms and signs involving the circulatory and respiratory systems: Secondary | ICD-10-CM

## 2010-10-21 DIAGNOSIS — R0609 Other forms of dyspnea: Secondary | ICD-10-CM

## 2010-10-22 LAB — BASIC METABOLIC PANEL
BUN: 18 mg/dL (ref 6–23)
CO2: 29 mEq/L (ref 19–32)
Calcium: 9.8 mg/dL (ref 8.4–10.5)
Creatinine, Ser: 1.7 mg/dL — ABNORMAL HIGH (ref 0.4–1.2)
Glucose, Bld: 320 mg/dL — ABNORMAL HIGH (ref 70–99)

## 2010-10-24 NOTE — Letter (Signed)
Summary: Cardiac Rehab Program  Cardiac Rehab Program   Imported By: Marylou Mccoy 10/18/2010 11:36:58  _____________________________________________________________________  External Attachment:    Type:   Image     Comment:   External Document

## 2010-10-24 NOTE — Progress Notes (Signed)
Summary: pt has medication question    lm to cb  Phone Note Call from Patient Call back at Home Phone [phone]   Caller: Patient Reason for Call: Talk to Nurse, Talk to Doctor Summary of Call: pt was given prednisone 50mg  at discharge and she was only given 5pills and she wants to know is that all she needs cause she only has 1pill left  Initial call taken by: Omer Jack,  October 09, 2010 10:59 AM  Follow-up for Phone Call        don't see prednisone listed on d/c papers NA and mail box full Meredith Staggers, RN  October 09, 2010 11:29 AM   spoke w/someone and letf mess for pt to call back Meredith Staggers, RN  October 09, 2010 4:51 PM   spoke w/pt she says no refills on prednisone, advised it is just just short term Meredith Staggers, RN  October 15, 2010 5:51 PM

## 2010-10-28 ENCOUNTER — Ambulatory Visit (HOSPITAL_COMMUNITY): Payer: Managed Care, Other (non HMO)

## 2010-10-29 NOTE — Assessment & Plan Note (Signed)
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 Admitted in February 2012 with NSTEMI peak troponin  5.5.   Underwent cath: EF 20-25%   LM: normal LAD: 30% D1 40% LCX: 70-80% ostial RCA: 10-20% mid  Treated medically. Here for f/u.   I reviewed cath films and flow through ostial LCX looks good. However, apical LD seems to have an abrupt occlusion and I wonder if this was infarct vessel.   Says she feels much better. Can do all chores without too much difficulty. When she stoops over too fast gets lightheaded. Can go to store and walk the whole store without stopping. Gets SOB on steps. No problems with meds - however is only taking hydralazine two times a day. Weighing every day. Weight on d/c was 214 and now down to 208. No edema, orthopnea or PND. No CP.    Current Medications (verified): 1)  Coreg 6.25 Mg  Tabs (Carvedilol) .... Take 1/2 Tablet By Mouth Two Times A Day 2)  Aspirin Ec 325 Mg Tbec (Aspirin) .... Take One Tablet By Mouth Daily 3)  Digoxin 0.125 Mg Tabs (Digoxin) .... Take One Tablet By Mouth Daily 4)  Enalapril Maleate 2.5 Mg Tabs (Enalapril Maleate) .... Take One Tablet By Mouth Twice A Day 5)  Hydralazine Hcl 25 Mg Tabs (Hydralazine Hcl) .... Take One Tablet By Mouth Three Times A Day 6)  Imdur 30 Mg Xr24h-Tab (Isosorbide Mononitrate) .... Take 1 Tablet By Mouth Once A Day 7)  Nitrostat 0.4 Mg Subl (Nitroglycerin) .Marland Kitchen.. 1 Tablet Under Tongue At Onset of Chest Pain; You May Repeat Every 5 Minutes For Up To 3 Doses. 8)  Crestor 40 Mg Tabs (Rosuvastatin Calcium) .... Take One Tablet By Mouth Daily. 9)  Spironolactone 25 Mg Tabs (Spironolactone) .... Take One Tablet By Mouth Daily 10)  Proair Hfa 108 (90 Base) Mcg/act Aers (Albuterol Sulfate) .... As Needed  Allergies (verified): No Known Drug  Allergies  Past History:  Past Medical History: 1. Congestive heart failure, systolic 2. Hypertension 3. DM-II, dx 01/2007 4. HL 5. CAD    --s/p NSTEMI 2/12 6. Obesity  Review of Systems       As per HPI and past medical history; otherwise all systems negative.   Vital Signs:  Patient profile:   55 year old female Height:      65.5 inches Weight:      211.25 pounds BMI:     34.74 Pulse rate:   63 / minute Pulse rhythm:   regular Resp:     18 per minute BP sitting:   126 / 90  (left arm) Cuff size:   large  Vitals Entered By: Vikki Ports (October 21, 2010 2:41 PM)  Physical Exam  General:  Well appearing. no resp difficulty HEENT: normal Neck: supple. no JVD. Carotids 2+ bilat; no bruits. No lymphadenopathy or thryomegaly appreciated. Cor: PMI nonpalpable. Regular rate & rhythm. No rubs, gallops, murmur. Lungs: clear Abdomen: obese soft, nontender, nondistended. No hepatosplenomegaly. No bruits or masses. Good bowel sounds. Extremities: no cyanosis, clubbing, rash, edema. old injury to LLE  Neuro: alert & orientedx3, cranial nerves grossly intact. moves all 4 extremities w/o difficulty. affect pleasant    Impression & Recommendations:  Problem # 1:  SYSTOLIC HEART FAILURE, CHRONIC (ICD-428.22) Doing NYHA II-III. Volume status  looks good. Will increase carvedilol to 6.25 two times a day and increase hydralazine to 25 three times a day. Check labs today. Will see [name] back in 3 weeks for potential titration of her meds - specifically her ACE-I at next visit. Discussed need for medication titration and repeat echo in 3 months. If no improvement will need ICD.  Problem # 2:  CAD, NATIVE VESSEL (ICD-414.01) Stable. No evidence of ischemia. Continue current regimen. Suggested cardiac rehab but cannot do it due to conflict with work schedule.   Other Orders: EKG w/ Interpretation (93000) T-Digoxin ([id number]) TLB-BMP (Basic Metabolic Panel-BMET)  (80048-METABOL) TLB-BNP (B-Natriuretic Peptide) (83880-BNPR)  Patient Instructions: 1)  Increase Carvedilol to 6.25mg  two times a day  2)  Increase Hydralazine to 25mg  three times a day  3)  Labs today 4)  Follow up in 3 weeks with [name], PA 5)  Follow up in 6 weeks with Dr Gala Romney 6)  Your physician deems you medically cleared to return to work.  A return to work note was provided today. Prescriptions: HYDRALAZINE HCL 25 MG TABS (HYDRALAZINE HCL) Take one tablet by mouth three times a day  #90 x 6   Entered by:   Meredith Staggers, RN   Authorized by:   Dolores Patty, MD, [facility]   Signed by:   Meredith Staggers, RN on 10/21/2010   Method used:   Electronically to        [facility] (retail)       [address]       [place], [place]  [address]       Ph: [phone]       Fax: [phone]   RxID:   [id number] COREG 6.25 MG  TABS (CARVEDILOL) Take 1 tablet by mouth two times a day  #60 x 6   Entered by:   Meredith Staggers, RN   Authorized by:   Dolores Patty, MD, [facility]   Signed by:   Meredith Staggers, RN on 10/21/2010   Method used:   Electronically to        [facility] (retail)       [address]       [place], [place]  [address]       Ph: [phone]       Fax: [phone]   RxID:   [id number]   Prevention & Chronic Care Immunizations   Influenza vaccine: Not documented    Tetanus booster: Not documented    Pneumococcal vaccine: Not documented  Colorectal Screening   Hemoccult: Not documented    Colonoscopy: Not documented  Other Screening   Pap smear: Not documented    Mammogram: Not documented   Smoking status: never  (10/18/2010)  Diabetes Mellitus   HgbA1C: 7.5  (12/09/2007)   Hemoglobin A1C due: 05/06/2007    Eye exam: Not documented    Foot exam: Not documented   High risk foot: Not documented   Foot care education: Not documented    Urine microalbumin/creatinine  ratio: 4.9  (02/04/2007)   Urine microalbumin/cr due: 02/04/2008  Lipids   Total Cholesterol: 177  (12/09/2007)   LDL: 112  (12/09/2007)   LDL Direct: Not documented   HDL: 42  (12/09/2007)   Triglycerides: 116  (12/09/2007)   Lipid panel due: 02/04/2008    SGOT (AST): 27  (12/20/2007)   SGPT (ALT): 40  (12/20/2007)  Alkaline phosphatase: 55  (12/20/2007)   Total bilirubin: 0.9  (12/20/2007)  Hypertension   Last Blood Pressure: 126 / 90  (10/21/2010)   Serum creatinine: 1.33  (12/20/2007)   Serum potassium 3.7  (12/20/2007)  Self-Management Support :    Diabetes self-management support: Not documented   Last diabetes self-management training by diabetes educator: 03/26/2007    Hypertension self-management support: Not documented    Lipid self-management support: Not documented

## 2010-10-29 NOTE — Letter (Signed)
Summary: Return To Work  [employer], Main Office  [address]   [place], [place] [address]   Phone: [phone]  Fax: [phone]    10/21/2010  TO: WHOM IT MAY CONCERN   RE: Brittany Evans [address] [place]   The above named individual is under my medical care and may return to work with no lifting over 25 pounds.  If you have any further questions or need additional information, please call.     Sincerely,    Arvilla Meres, MD

## 2010-10-30 ENCOUNTER — Ambulatory Visit (HOSPITAL_COMMUNITY): Payer: Managed Care, Other (non HMO)

## 2010-10-31 ENCOUNTER — Other Ambulatory Visit: Payer: Managed Care, Other (non HMO)

## 2010-11-01 ENCOUNTER — Encounter: Payer: Self-pay | Admitting: Internal Medicine

## 2010-11-01 ENCOUNTER — Ambulatory Visit (HOSPITAL_COMMUNITY): Payer: Managed Care, Other (non HMO)

## 2010-11-01 ENCOUNTER — Other Ambulatory Visit: Payer: Self-pay | Admitting: Internal Medicine

## 2010-11-01 ENCOUNTER — Other Ambulatory Visit (INDEPENDENT_AMBULATORY_CARE_PROVIDER_SITE_OTHER): Payer: Managed Care, Other (non HMO)

## 2010-11-01 DIAGNOSIS — I5023 Acute on chronic systolic (congestive) heart failure: Secondary | ICD-10-CM

## 2010-11-01 LAB — BASIC METABOLIC PANEL
CO2: 28 mEq/L (ref 19–32)
Calcium: 9.1 mg/dL (ref 8.4–10.5)
Creatinine, Ser: 1.5 mg/dL — ABNORMAL HIGH (ref 0.4–1.2)
Glucose, Bld: 364 mg/dL — ABNORMAL HIGH (ref 70–99)

## 2010-11-02 ENCOUNTER — Telehealth: Payer: Self-pay | Admitting: *Deleted

## 2010-11-03 ENCOUNTER — Telehealth (INDEPENDENT_AMBULATORY_CARE_PROVIDER_SITE_OTHER): Payer: Self-pay | Admitting: *Deleted

## 2010-11-04 ENCOUNTER — Ambulatory Visit (HOSPITAL_COMMUNITY): Payer: Managed Care, Other (non HMO)

## 2010-11-04 ENCOUNTER — Telehealth: Payer: Self-pay | Admitting: Internal Medicine

## 2010-11-05 ENCOUNTER — Telehealth: Payer: Self-pay | Admitting: Internal Medicine

## 2010-11-05 NOTE — Telephone Encounter (Signed)
Pt. Return yesterday's phone call from [facility] RN.  Pt. States she is not taken Digoxin anymore. (Please seen [employer]  Phone call note on 11/03/09)

## 2010-11-05 NOTE — Progress Notes (Addendum)
Summary: Cardiology Note - Digoxin level  Phone Note Other Incoming   Summary of Call: Overnight Dr. Marcelle Overlie (fellow) received a critical lab value of digoxin level 2.8 on Brittany Evans. He left a note saying he attempted to call multiple times with no answer and the voice mailbox is full. I attempted to contact her again this morning with no answer, and the same full mailbox message. Will attempt to contact again several more times. Will need f/u lab Monday (dig level) with likely decrease in her dose to 0.125mg  daily or even q.o.d. depending on stability of her renal function.  Initial call taken by: Ronie Spies PA-C     Appended Document: Cardiology Note - Digoxin level lets just stop digoxin. if we can't get in touch with her by phone. may need to send family or police to her door as dig level is getting quite high.

## 2010-11-05 NOTE — Letter (Signed)
Summary: Cardiac Rehabilitation Program  Cardiac Rehabilitation Program   Imported By: Marylou Mccoy 10/29/2010 15:02:10  _____________________________________________________________________  External Attachment:    Type:   Image     Comment:   External Document

## 2010-11-06 ENCOUNTER — Ambulatory Visit (HOSPITAL_COMMUNITY): Payer: Managed Care, Other (non HMO)

## 2010-11-07 ENCOUNTER — Encounter: Payer: Self-pay | Admitting: Physician Assistant

## 2010-11-08 ENCOUNTER — Ambulatory Visit (HOSPITAL_COMMUNITY): Payer: Managed Care, Other (non HMO)

## 2010-11-11 ENCOUNTER — Ambulatory Visit (HOSPITAL_COMMUNITY): Payer: Managed Care, Other (non HMO)

## 2010-11-13 ENCOUNTER — Ambulatory Visit (HOSPITAL_COMMUNITY): Payer: Managed Care, Other (non HMO)

## 2010-11-14 NOTE — Progress Notes (Signed)
Summary: pt rtn call  Phone Note Call from Patient   Caller: Patient [phone] Reason for Call: Talk to Nurse Summary of Call: pt rtn call -not sure who/why from dr Jaxen Samples nurse the only note i see is from dayna but she's off today- Initial call taken by: Glynda Jaeger,  November 04, 2010 4:29 PM  Follow-up for Phone Call        called pt back, spoke w/brother he states she doesn't have cell phone, attempted to call work # earlier but it is no longer in service, advised brother it is very important we speak to her need to make sure she is not taking her digoxin anymore, Left message to call back Meredith Staggers, RN  November 04, 2010 5:09 PM   Pt. called Heather back. Pt. states she is not taken Digoxin anymore. Follow-up by: Ollen Gross, RN, BSN,  November 05, 2010 10:56 AM

## 2010-11-14 NOTE — Progress Notes (Signed)
Summary: Cardiology Phone Note - Digoxin  Phone Note Call from Patient   Caller: Other Relative Summary of Call: After several attempts of not reaching patient yesterday, tried again today and finally reached family member at home re: digoxin level of 2.8. I spoke with Brittany Evans, her brother, who could not provide a number where I could speak to the patient directly (he stated she was out of town for the day). He said he would pass along the message so I told him to please instruct Brittany Evans to STOP her digoxin and await word on Monday from Dr. Prescott Gum office for further labwork/instructions. Will also forward to RN to review with Dr. Gala Romney tomorrow. I am not sure where the office is in terms of working with Brittany Evans over Epic, so I have also left a message on the office voicemail system to discuss w/ MD tomorrow. Initial call taken by: Ronie Spies PA-C

## 2010-11-15 ENCOUNTER — Ambulatory Visit (INDEPENDENT_AMBULATORY_CARE_PROVIDER_SITE_OTHER): Payer: Managed Care, Other (non HMO) | Admitting: Physician Assistant

## 2010-11-15 ENCOUNTER — Encounter: Payer: Self-pay | Admitting: Physician Assistant

## 2010-11-15 ENCOUNTER — Ambulatory Visit (HOSPITAL_COMMUNITY): Payer: Managed Care, Other (non HMO)

## 2010-11-15 VITALS — BP 108/75 | HR 59 | Ht 65.0 in | Wt 209.0 lb

## 2010-11-15 DIAGNOSIS — I2510 Atherosclerotic heart disease of native coronary artery without angina pectoris: Secondary | ICD-10-CM

## 2010-11-15 DIAGNOSIS — I5022 Chronic systolic (congestive) heart failure: Secondary | ICD-10-CM

## 2010-11-15 DIAGNOSIS — N289 Disorder of kidney and ureter, unspecified: Secondary | ICD-10-CM

## 2010-11-15 LAB — BASIC METABOLIC PANEL
BUN: 21 mg/dL (ref 6–23)
Calcium: 9.5 mg/dL (ref 8.4–10.5)
GFR: 42.57 mL/min — ABNORMAL LOW (ref >60.00)
Glucose, Bld: 188 mg/dL — ABNORMAL HIGH (ref 70–99)

## 2010-11-15 MED ORDER — ENALAPRIL MALEATE 2.5 MG PO TABS: 2.5000 mg | ORAL_TABLET | Freq: Every day | ORAL | Status: DC

## 2010-11-15 MED ORDER — ENALAPRIL MALEATE 2.5 MG PO TABS: 2.5000 mg | ORAL_TABLET | Freq: Two times a day (BID) | ORAL | Status: DC

## 2010-11-15 NOTE — Assessment & Plan Note (Signed)
As noted, basic metabolic panel will be obtained today and again in 2 weeks.

## 2010-11-15 NOTE — Assessment & Plan Note (Addendum)
Overall stable.  I will try to adjust her ACE inhibitor.  Her blood pressure may not tolerate this.  She will increase her Enalapril to 5 mg in the morning and 2.5 mg in the evening for a week.  If she tolerates this, she can increase this to 5 mg twice a day.  She does have some renal insufficiency.  We will need to keep a close eye on this.  She'll have a basic metabolic panel again today and repeat in 2 weeks.  She already has followup arranged with Dr. Gala Romney in several weeks.  She will be set up for a followup echocardiogram in a couple of months to reassess her LV function.  If her LV does not recover, she will likely be a candidate for AICD.

## 2010-11-15 NOTE — Assessment & Plan Note (Signed)
No angina.  Continue aspirin. 

## 2010-11-15 NOTE — Progress Notes (Signed)
History of Present Illness: Primary Cardiologist:  Dr. Arvilla Meres  Brittany Evans is a 55 y.o. female with h/o HTN, HL, DM2 and CHF due to a nonischemic CM. Admitted in February 2012 with NSTEMI peak troponin 5.5.  Underwent cath: EF 20-25%, LM: normal, LAD: 30%,  D1 40%, LCX: 70-80% ostial, RCA: 10-20% mid.  She was treated medically.  She saw Dr. Gala Romney a few weeks ago and he reviewed cath films and flow through ostial LCX looked good. However, apical LAD seemed to have an abrupt occlusion and it was thought that this was possibly the  infarct vessel.  He adjusted her coreg last time and her  Hydralazine.  She returns for follow up and titration of her ACE.  In the meantime, she had her digoxin stopped due to high levels.  She has a creatinine of about 1.5.  She denies chest pain, dyspnea, or syncope.  No palps.  No orthopnea or pnd.  She has some dependent edema.   Past Medical History  Diagnosis Date  . Systolic CHF   . Diabetes mellitus   . Hypertension   . Asthma   . NICM (nonischemic cardiomyopathy)     EF 20-25%  . CAD (coronary artery disease)     NSTEMI 2/12: LAD 30%, D1 40%, oCFX 70-80%, mRCA 10-20%, ?occl. of apical LAD; treated medically  . Hyperlipidemia     Current Outpatient Prescriptions  Medication Sig Dispense Refill  . aspirin 325 MG tablet Take 325 mg by mouth daily.        . carvedilol (COREG) 6.25 MG tablet Take 6.25 mg by mouth 2 (two) times daily with a meal.        . enalapril (VASOTEC) 2.5 MG tablet Take 2.5 mg by mouth 2 (two) times daily.        . hydrALAZINE (APRESOLINE) 25 MG tablet Take 25 mg by mouth 3 (three) times daily.        . nitroGLYCERIN (NITROSTAT) 0.4 MG SL tablet Place 0.4 mg under the tongue every 5 (five) minutes as needed.        . rosuvastatin (CRESTOR) 40 MG tablet Take 40 mg by mouth daily.        Marland Kitchen spironolactone (ALDACTONE) 25 MG tablet Take 1 tablet by mouth daily.      Marland Kitchen albuterol (PROAIR HFA) 108 (90 BASE) MCG/ACT inhaler  Inhale 2 puffs into the lungs every 6 (six) hours as needed.        . digoxin (LANOXIN) 0.125 MG tablet Take 125 mcg by mouth daily. Take 1/2 tab qd       . isosorbide mononitrate (IMDUR) 30 MG 24 hr tablet Take 30 mg by mouth daily.          No Known Allergies  Vital Signs: BP 108/75  Pulse 59  Ht 5\' 5"  (1.651 m)  Wt 209 lb (94.802 kg)  BMI 34.78 kg/m2  PHYSICAL EXAM: Well nourished, well developed, in no acute distress HEENT: normal Neck: no JVD Cardiac:  normal S1, S2; RRR; no murmur Lungs:  clear to auscultation bilaterally, no wheezing, rhonchi or rales Abd: soft, nontender, no hepatomegaly Ext: no edema Skin: warm and dry Neuro:  CNs 2-12 intact, no focal abnormalities noted  ASSESSMENT AND PLAN:

## 2010-11-15 NOTE — Patient Instructions (Signed)
Your physician has recommended you make the following change in your medication: Increase Enalapril 2.5mg  to 2 tablets in AM and 1 tablet in PM for one week.  If Tolerates, then increase Enalapril to 2.5mg  2 tablets twice a day.  Your physician recommends that you return for lab work in: To have a BMET today and again in 2 weeks.  Your physician recommends that you schedule a follow-up appointment in: To keep appointment already scheduled.

## 2010-11-18 ENCOUNTER — Ambulatory Visit (HOSPITAL_COMMUNITY): Payer: Managed Care, Other (non HMO)

## 2010-11-20 ENCOUNTER — Ambulatory Visit (HOSPITAL_COMMUNITY): Payer: Managed Care, Other (non HMO)

## 2010-11-22 ENCOUNTER — Ambulatory Visit (HOSPITAL_COMMUNITY): Payer: Managed Care, Other (non HMO)

## 2010-11-25 ENCOUNTER — Ambulatory Visit (HOSPITAL_COMMUNITY): Payer: Managed Care, Other (non HMO)

## 2010-11-25 ENCOUNTER — Ambulatory Visit (INDEPENDENT_AMBULATORY_CARE_PROVIDER_SITE_OTHER): Payer: Managed Care, Other (non HMO) | Admitting: Internal Medicine

## 2010-11-25 ENCOUNTER — Encounter: Payer: Self-pay | Admitting: Internal Medicine

## 2010-11-25 VITALS — BP 144/88 | HR 75 | Temp 97.7°F | Resp 14 | Wt 206.0 lb

## 2010-11-25 DIAGNOSIS — I5022 Chronic systolic (congestive) heart failure: Secondary | ICD-10-CM

## 2010-11-25 DIAGNOSIS — N181 Chronic kidney disease, stage 1: Secondary | ICD-10-CM

## 2010-11-25 DIAGNOSIS — Z1231 Encounter for screening mammogram for malignant neoplasm of breast: Secondary | ICD-10-CM

## 2010-11-25 DIAGNOSIS — R079 Chest pain, unspecified: Secondary | ICD-10-CM

## 2010-11-25 DIAGNOSIS — E119 Type 2 diabetes mellitus without complications: Secondary | ICD-10-CM

## 2010-11-25 DIAGNOSIS — I10 Essential (primary) hypertension: Secondary | ICD-10-CM

## 2010-11-25 DIAGNOSIS — E785 Hyperlipidemia, unspecified: Secondary | ICD-10-CM

## 2010-11-25 MED ORDER — LINAGLIPTIN 5 MG PO TABS: 1.0000 | ORAL_TABLET | Freq: Every day | ORAL | Status: DC

## 2010-11-25 NOTE — Assessment & Plan Note (Signed)
Her BP is well controlled 

## 2010-11-25 NOTE — Assessment & Plan Note (Addendum)
I think this is fatty liver disease, she is not a candidate for actos due to her hx. of CHF, I will check a liver ultrasound and if needed order labs for other causes or hepatitis (viral, autoimmune, etc.)

## 2010-11-25 NOTE — Progress Notes (Signed)
Subjective:    Patient ID: Brittany Evans, female    DOB: [birth year], 55 y.o.   MRN: [id number]  Diabetes She presents for her follow-up diabetic visit. She has type 2 diabetes mellitus. No MedicAlert identification noted. Her disease course has been stable. There are no hypoglycemic associated symptoms. Pertinent negatives for hypoglycemia include no confusion, dizziness, headaches, nervousness/anxiousness, pallor, seizures, speech difficulty or tremors. Associated symptoms include polyuria. Pertinent negatives for diabetes include no blurred vision, no chest pain, no fatigue, no foot paresthesias, no foot ulcerations, no polydipsia, no polyphagia, no visual change, no weakness and no weight loss. There are no hypoglycemic complications. Symptoms are stable. Diabetic complications include heart disease. Current diabetic treatment includes diet. Her weight is stable. She is following a generally healthy diet. She has not had a previous visit with a dietician. She participates in exercise three times a week. There is no change in her home blood glucose trend. An ACE inhibitor/angiotensin II receptor blocker is being taken. She does not see a podiatrist.Eye exam is not current.      Review of Systems  Constitutional: Negative for fever, chills, weight loss, diaphoresis, activity change, appetite change, fatigue and unexpected weight change.  Eyes: Negative for blurred vision.  Respiratory: Negative for cough, choking, shortness of breath, wheezing and stridor.   Cardiovascular: Negative for chest pain, palpitations and leg swelling.  Gastrointestinal: Negative for nausea, vomiting, abdominal pain, diarrhea, constipation, blood in stool and abdominal distention.  Genitourinary: Positive for polyuria and frequency. Negative for dysuria, urgency, hematuria, flank pain, decreased urine volume, difficulty urinating and dyspareunia.  Musculoskeletal: Negative for myalgias, back pain, joint swelling,  arthralgias and gait problem.  Skin: Negative for color change, pallor and rash.  Neurological: Negative for dizziness, tremors, seizures, syncope, facial asymmetry, speech difficulty, weakness, light-headedness, numbness and headaches.  Hematological: Negative for polydipsia, polyphagia and adenopathy.  Psychiatric/Behavioral: Negative for suicidal ideas, hallucinations, behavioral problems, confusion, sleep disturbance, self-injury, dysphoric mood, decreased concentration and agitation. The patient is not nervous/anxious and is not hyperactive.        Lab Results  Component Value Date   WBC 8.8 10/02/2010   HGB 13.7 10/02/2010   HCT 40.9 10/02/2010   PLT 200 10/02/2010   CHOL  Value: 212        ATP III CLASSIFICATION:  <200     mg/dL   Desirable  295-621  mg/dL   Borderline High  >=308    mg/dL   High       * 6/57/8469   TRIG 96 10/01/2010   HDL 63 10/01/2010   ALT 47* 09/30/2010   AST 61* 09/30/2010   NA 138 11/15/2010   K 4.2 11/15/2010   CL 105 11/15/2010   CREATININE 1.6* 11/15/2010   BUN 21 11/15/2010   CO2 28 11/15/2010   TSH 1.962 10/02/2010   INR 1.00 09/30/2010   HGBA1C  Value: 7.5 (NOTE)                                                                       According to the ADA Clinical Practice Recommendations for 2011, when HbA1c is used as a screening test:   >=6.5%   Diagnostic of Diabetes Mellitus           (  if abnormal result  is confirmed)  5.7-6.4%   Increased risk of developing Diabetes Mellitus  References:Diagnosis and Classification of Diabetes Mellitus,Diabetes Care,2011,34(Suppl 1):S62-S69 and Standards of Medical Care in         Diabetes - 2011,Diabetes Care,2011,34  (Suppl 1):S11-S61.* 09/30/2010   MICROALBUR 0.98 02/04/2007   Objective:   Physical Exam  Constitutional: She is oriented to person, place, and time. She appears well-developed and well-nourished. No distress.  HENT:  Head: Normocephalic and atraumatic.  Right Ear: External ear normal.  Left Ear: External ear  normal.  Nose: Nose normal.  Mouth/Throat: Oropharynx is clear and moist. No oropharyngeal exudate.  Eyes: Conjunctivae and EOM are normal. Pupils are equal, round, and reactive to light. Right eye exhibits no discharge. Left eye exhibits no discharge. No scleral icterus.  Neck: Normal range of motion. Neck supple. No thyromegaly present.  Cardiovascular: Normal rate, regular rhythm, normal heart sounds and intact distal pulses.  Exam reveals no gallop and no friction rub.   No murmur heard. Pulmonary/Chest: Effort normal and breath sounds normal. No respiratory distress. She has no wheezes. She has no rales. She exhibits no tenderness.  Abdominal: Soft. Bowel sounds are normal. She exhibits no distension and no mass. There is no tenderness. There is no rebound and no guarding.  Musculoskeletal: Normal range of motion. She exhibits no tenderness.  Lymphadenopathy:    She has no cervical adenopathy.  Neurological: She is alert and oriented to person, place, and time. She has normal reflexes.  Skin: Skin is warm and dry. No rash noted. She is not diaphoretic. No erythema. No pallor.  Psychiatric: She has a normal mood and affect. Her behavior is normal. Judgment and thought content normal.          Assessment & Plan:

## 2010-11-25 NOTE — Assessment & Plan Note (Signed)
Will start her on tradjenta since she has liver and renal disease, also sent her for annual DM eye exam and Diabetic education

## 2010-11-25 NOTE — Assessment & Plan Note (Signed)
Will check a renal ultrasound to look for obstruction or lesion, will have to avoid metformin in light of elevated creatinine

## 2010-11-25 NOTE — Patient Instructions (Signed)
Diabetes, Type 2 Diabetes is a lasting (chronic) disease. In type 2 diabetes, the pancreas does not make enough insulin (a hormone), and the body does not respond normally to the insulin that is made. This type of diabetes was also previously called adult onset diabetes. About 90% of all those who have diabetes have type 2. It usually occurs after the age of 40 but can occur at any age. CAUSES Unlike type 1 diabetes, which happens because insulin is no longer being made, type 2 diabetes happens because the body is making less insulin and has trouble using the insulin properly. SYMPTOMS  Drinking more than usual.   Urinating more than usual.   Blurred vision.   Dry, itchy skin.   Frequent infection like yeast infections in women.   More tired than usual (fatigue).  TREATMENT  Healthy eating.   Exercise.   Medication, if needed.   Monitoring blood glucose (sugar).   Seeing your caregiver regularly.  HOME CARE INSTRUCTIONS  Check your blood glucose (sugar) at least once daily. More frequent monitoring may be necessary, depending on your medications and on how well your diabetes is controlled. Your caregiver will advise you.   Take your medicine as directed by your caregiver.   Do not smoke.   Make wise food choices. Ask your caregiver for information. Weight loss can improve your diabetes.   Learn about low blood glucose (hypoglycemia) and how to treat it.   Get your eyes checked regularly.   Have a yearly physical exam. Have your blood pressure checked. Get your blood and urine tested.   Wear a pendant or bracelet saying that you have diabetes.   Check your feet every night for sores. Let your caregiver know if you have sores that are not healing.  SEEK MEDICAL CARE IF:  You are having problems keeping your blood glucose at target range.   You feel you might be having problems with your medicines.   You have symptoms of an illness that is not improving after 24  hours.   You have a sore or wound that is not healing.   You notice a change in vision or a new problem with your vision.   You develop a fever of more than 100.5.  Document Released: 08/04/2005 Document Re-Released: 08/26/2009 Exit[employer] Patient Information 2011 [employer]. 

## 2010-11-26 ENCOUNTER — Other Ambulatory Visit: Payer: Self-pay | Admitting: Cardiology

## 2010-11-26 DIAGNOSIS — N189 Chronic kidney disease, unspecified: Secondary | ICD-10-CM

## 2010-11-27 ENCOUNTER — Ambulatory Visit: Payer: Managed Care, Other (non HMO) | Admitting: Cardiology

## 2010-11-27 ENCOUNTER — Other Ambulatory Visit: Payer: Self-pay | Admitting: Internal Medicine

## 2010-11-27 ENCOUNTER — Other Ambulatory Visit: Payer: Self-pay | Admitting: Neurological Surgery

## 2010-11-27 ENCOUNTER — Ambulatory Visit (HOSPITAL_COMMUNITY): Payer: Managed Care, Other (non HMO)

## 2010-11-28 ENCOUNTER — Other Ambulatory Visit (INDEPENDENT_AMBULATORY_CARE_PROVIDER_SITE_OTHER): Payer: Managed Care, Other (non HMO) | Admitting: *Deleted

## 2010-11-28 DIAGNOSIS — N289 Disorder of kidney and ureter, unspecified: Secondary | ICD-10-CM

## 2010-11-28 DIAGNOSIS — I5022 Chronic systolic (congestive) heart failure: Secondary | ICD-10-CM

## 2010-11-28 LAB — BASIC METABOLIC PANEL
BUN: 20 mg/dL (ref 6–23)
CO2: 26 mEq/L (ref 19–32)
Calcium: 9.6 mg/dL (ref 8.4–10.5)
Chloride: 102 mEq/L (ref 96–112)
Creatinine, Ser: 1.6 mg/dL — ABNORMAL HIGH (ref 0.4–1.2)
GFR: 43.81 mL/min — ABNORMAL LOW (ref >60.00)
Glucose, Bld: 141 mg/dL — ABNORMAL HIGH (ref 70–99)
Potassium: 4.5 mEq/L (ref 3.5–5.1)
Sodium: 138 mEq/L (ref 135–145)

## 2010-11-29 ENCOUNTER — Ambulatory Visit (HOSPITAL_COMMUNITY): Payer: Managed Care, Other (non HMO)

## 2010-11-29 ENCOUNTER — Encounter: Payer: Self-pay | Admitting: Internal Medicine

## 2010-11-29 ENCOUNTER — Ambulatory Visit
Admission: RE | Admit: 2010-11-29 | Discharge: 2010-11-29 | Disposition: A | Discharge status: Home / Self Care | Payer: Managed Care, Other (non HMO) | Source: Ambulatory Visit | Attending: Internal Medicine | Admitting: Internal Medicine

## 2010-12-02 ENCOUNTER — Ambulatory Visit (HOSPITAL_COMMUNITY): Payer: Managed Care, Other (non HMO)

## 2010-12-04 ENCOUNTER — Ambulatory Visit (HOSPITAL_COMMUNITY): Payer: Managed Care, Other (non HMO)

## 2010-12-06 ENCOUNTER — Ambulatory Visit (HOSPITAL_COMMUNITY): Payer: Managed Care, Other (non HMO)

## 2010-12-09 ENCOUNTER — Ambulatory Visit (HOSPITAL_COMMUNITY): Payer: Managed Care, Other (non HMO)

## 2010-12-10 ENCOUNTER — Ambulatory Visit (INDEPENDENT_AMBULATORY_CARE_PROVIDER_SITE_OTHER): Payer: Managed Care, Other (non HMO) | Admitting: Internal Medicine

## 2010-12-10 ENCOUNTER — Encounter: Payer: Self-pay | Admitting: Internal Medicine

## 2010-12-10 DIAGNOSIS — I2510 Atherosclerotic heart disease of native coronary artery without angina pectoris: Secondary | ICD-10-CM

## 2010-12-10 DIAGNOSIS — I5022 Chronic systolic (congestive) heart failure: Secondary | ICD-10-CM

## 2010-12-10 MED ORDER — CARVEDILOL 6.25 MG PO TABS: ORAL_TABLET | ORAL | Status: DC

## 2010-12-10 MED ORDER — ENALAPRIL MALEATE 2.5 MG PO TABS: ORAL_TABLET | ORAL | Status: DC

## 2010-12-10 NOTE — Patient Instructions (Signed)
Increase Enalapril to 5mg  Twice daily  Increase Carvedilol 6.25mg  to 1 & 1/2 tabs Twice daily  Your physician has requested that you have an echocardiogram. Echocardiography is a painless test that uses sound waves to create images of your heart. It provides your doctor with information about the size and shape of your heart and how well your heart's chambers and valves are working. This procedure takes approximately one hour. There are no restrictions for this procedure. Your physician recommends that you schedule a follow-up appointment in: 1 month

## 2010-12-10 NOTE — Assessment & Plan Note (Signed)
No evidence of ischemia. Continue current regimen.   

## 2010-12-10 NOTE — Assessment & Plan Note (Signed)
Doing great NYHA I. Volume status looks great. I suspect she may have already have had some improvement in EF. Will titrate enalapril to 5 bid and carvedilol to 9.375 bid. BMET and BNP in 1 week. Check echo next month. Discussed use of daily wieghts and to call if problems.

## 2010-12-10 NOTE — Progress Notes (Signed)
History of Present Illness: Primary Cardiologist:  Dr. Arvilla Meres  Brittany Evans is a 55 y.o. female with h/o HTN, HL, DM2, CRI (1.5-1.7) and CHF due to a nonischemic CM. Admitted in February 2012 with NSTEMI peak troponin 5.5.  Underwent cath: EF 20-25%, LM: normal, LAD: 30%,  D1 40%, LCX: 70-80% ostial, RCA: 10-20% mid.  She was treated medically.  At her f/u visit, I  ago reviewed her cath films and flow through ostial LCX looked good. However, apical LAD seemed to have an abrupt occlusion and it was thought that this was possibly the  infarct vessel. We have been titrating her ACE-I and b-blocker. Digoxin was stopped to high digoxin level.   Last saw Brittany Evans about 2 weeks ago enalapril to 5mg  in am and 2.5mg  in pm.  Tolerating it well. Feels good.  Walking 45 mins several times per week without breaks. No CP or occasional dyspnea. Can walk up hills and steps without problem. No dizziness. Working full time. Weighing every other morning and weight droppign slowly. Lost 15 pounds. \  Had ab u/s kidneys 9.5-9.6cm bilaterally. Most recent labs with stable CR (1.6) and K 4.5   Past Medical History  Diagnosis Date  . Systolic CHF   . Diabetes mellitus   . Hypertension   . Asthma   . NICM (nonischemic cardiomyopathy)     EF 20-25%  . CAD (coronary artery disease)     NSTEMI 2/12: LAD 30%, D1 40%, oCFX 70-80%, mRCA 10-20%, ?occl. of apical LAD; treated medically  . Hyperlipidemia   . Obesity     Current Outpatient Prescriptions  Medication Sig Dispense Refill  . albuterol (PROAIR HFA) 108 (90 BASE) MCG/ACT inhaler Inhale 2 puffs into the lungs every 6 (six) hours as needed.        Marland Kitchen aspirin 325 MG tablet Take 325 mg by mouth daily.        . carvedilol (COREG) 6.25 MG tablet Take 6.25 mg by mouth 2 (two) times daily with a meal.        . enalapril (VASOTEC) 2.5 MG tablet Take 1 tablet (2.5 mg total) by mouth 2 (two) times daily. Increase to 2 tablets in AM and 1 tablet in PM  for one week.  If tolerates, then increase Enalapril to 2.5mg  twice a day.  60 tablet  11  . hydrALAZINE (APRESOLINE) 25 MG tablet Take 25 mg by mouth 3 (three) times daily.        . isosorbide mononitrate (IMDUR) 30 MG 24 hr tablet Take 30 mg by mouth daily.        . Linagliptin (TRADJENTA) 5 MG TABS 1 tab po qd       . Multiple Vitamin (MULTIVITAMIN) tablet Take 1 tablet by mouth daily. One-A-Day MV       . nitroGLYCERIN (NITROSTAT) 0.4 MG SL tablet Place 0.4 mg under the tongue every 5 (five) minutes as needed.        . rosuvastatin (CRESTOR) 40 MG tablet Take 40 mg by mouth daily.        Marland Kitchen spironolactone (ALDACTONE) 25 MG tablet Take 1 tablet by mouth daily.      Marland Kitchen DISCONTD: digoxin (LANOXIN) 0.125 MG tablet Take 125 mcg by mouth daily. Take 1/2 tab qd       . DISCONTD: enalapril (VASOTEC) 2.5 MG tablet Take 1 tablet (2.5 mg total) by mouth daily.  30 tablet  11  . DISCONTD: Linagliptin 5 MG TABS Take 1 tablet  by mouth daily.  49 tablet  0    No Known Allergies  Vital Signs: BP 130/80  Pulse 68  Resp 12  Ht 5\' 5"  (1.651 m)  Wt 205 lb (92.987 kg)  BMI 34.11 kg/m2  PHYSICAL EXAM: Well nourished, well developed, in no acute distress HEENT: normal Neck: no JVD Cardiac:  normal S1, S2; RRR; no murmur Lungs:  clear to auscultation bilaterally, no wheezing, rhonchi or rales Abd: soft, nontender, no hepatomegaly Ext: no edema Skin: warm and dry Neuro:  CNs 2-12 intact, no focal abnormalities noted  ASSESSMENT AND PLAN:

## 2010-12-11 ENCOUNTER — Ambulatory Visit (HOSPITAL_COMMUNITY): Payer: Managed Care, Other (non HMO)

## 2010-12-13 ENCOUNTER — Ambulatory Visit (HOSPITAL_COMMUNITY): Payer: Managed Care, Other (non HMO)

## 2010-12-16 ENCOUNTER — Ambulatory Visit (HOSPITAL_COMMUNITY): Payer: Managed Care, Other (non HMO)

## 2010-12-17 NOTE — Consult Note (Signed)
Brittany Evans, Brittany Evans           ACCOUNT NO.:  [id number]  MEDICAL RECORD NO.:  [id number]           PATIENT TYPE:  I  LOCATION:  2918                         FACILITY:  [facility]  PHYSICIAN:  Bevelyn Buckles. [name], MDDATE OF BIRTH:  Apr 11, 1956  DATE OF CONSULTATION:  09/30/2010 DATE OF DISCHARGE:                                CONSULTATION   PRIMARY CARE PHYSICIAN:  None.  PRIMARY CARDIOLOGIST:  None.  CHIEF COMPLAINT:  Chest pain and shortness of breath.  HISTORY OF PRESENTING ILLNESS:  This is a 54 year old woman with hypertension, diabetes, and hyperlipidemia diagnosed in 2009, when she was admitted with CHF in the setting of uncontrolled hypertension, came to the emergency room at 3 a.m. in the morning of admission for shortness of breath.  She was in her usual state of health yesterday until she went to bed, she woke up with severe dyspnea and wheezing. She came to the ER for shortness of breath and was treated with bronchodilators and prednisone and Lasix and was discharged home with treatment for acute bronchitis.  She returned to the ER again the same day at 9 a.m. in the morning with severe chest and back pain, shortness of breath, and nausea.  Her initial cardiac enzymes showed a mildly high troponin of 0.28 without any EKG changes.  She was given 4 aspirin, Zofran, and Ativan in the emergency room which relieved her symptoms. Cardiology was consulted at this time for admission of the patient in the hospital.  The patient says that she has not been taking any of her medications since last 2 years.  She says she is supposed to take a diabetes, blood pressure in a water pill, that she was given a prescription when she was discharged from the hospital in 2009.  She did not refill that prescription because of financial problems, but she says that she has a new job and an insurance from the job and she will be able to get her medicines from now.  She said that she did feel  some palpitations over this past 1 year but she never had shortness of breath, chest pain, or any other symptoms.  She has not seen a doctor in last 2 years since her discharge from this hospital.  PAST MEDICAL HISTORY: 1. Hospital admission in 2009, for congestive heart failure secondary     to hypertensive urgency. 2. Hypertension. 3. Hyperlipidemia. 4. Diabetes mellitus type 2.  HOME MEDICATIONS:  The patient says she is supposed to take diabetes, blood pressure in water pill but she has not been taking anything since the last 2 years given financial difficulties.  SOCIAL HISTORY:  The patient lives in [place] with her daughter.  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  She denies tobacco, alcohol, or illegal drug abuse.  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  FAMILY HISTORY:  Significant for coronary artery disease in her mother who had a PCI and stenting at age of 4 years and diabetes and cancer of pelvis in her mother.  Her father had cirrhosis from alcohol abuse.  REVIEW OF SYMPTOMS:  The patient denies fever, chills, headache, nasal  bleeds, skin rashes, dysuria, weakness, numbness, neurological impairment, joint pain, swelling, arthralgias, polyuria, polydipsia, or cold intolerance.  Her other review of systems is negative except as per the HPI.  The patient is a full code at this time.  PHYSICAL EXAMINATION:  VITAL SIGNS:  Temperature 98.2, pulse has been in the range of 112-120 per minute in the emergency room, respiratory rate has been around 22-30 in the emergency room, blood pressure has been in the range of 190-206 upon 110-120, oxygen saturation 94% on room air. GENERAL:  The patient is awake, lying in bed, in no acute distress. HEENT:  Pupils equal and reactive to light.  Extraocular muscles intact. NECK:  Supple.  No JVD.  Lymphadenopathy none. ABDOMEN:  Soft, nontender, nondistended.  Normal bowel sounds. EXTREMITIES:  No edema.  Normal  pulsations. CVS:  Regular rate and rhythm.  S1 and S2 clearly heard.  The patient is tachycardic.  Pulse 2+ bilaterally.  No murmurs or bruits. LUNGS:  Clear to auscultation bilaterally.  Some coarse breath sounds at the bases, but no definite crackles or wheezes heard. NEUROLOGIC:  Alert and oriented x3.  Cranial nerves II-XII intact. Sensation is grossly intact in all 4 extremities.  Motor strength good. Gait normal.  X-RAYS: 1. The patient had a chest x-ray which showed diffuse bilateral     airspace disease and cardiomegaly with appearance consistent with     pulmonary edema and congestive heart failure. 2. CT angio of the chest did show interstitial edema and diffuse     coronary calcification, no pulmonary embolism.  EKG showed a rate of 126 with sinus tachycardia, normal axis, left ventricular hypertrophy, no ST-T wave changes, no significant changes from previous EKG dated December 09, 2007.  LABS AT ADMISSION:  Sodium 136, potassium 3.6, chloride 105, bicarb 25, BUN 17, creatinine 1.34, glucose 175.  Hemoglobin 15, white count 6.9. Glucose 223.  D-dimer 1.17, BNP 233, troponin 0.28, CK 91, MB 5.2.  PTT of 22, PT of 13.4, INR of 1.0.  ASSESSMENT AND PLAN: 1. Non-ST-elevation myocardial infarction, this is probably secondary     to hypertensive urgency versus ischemia.  The patient will be     admitted to CCU for blood pressure control and for ruling out     coronary ischemia.  She has already been started on heparin and     nitroglycerin drip by the emergency physician.  We will add Coreg     at a dose of 6.25 mg by mouth twice a day as the patient's     congestive heart failure seems to be fairly well compensated.  At     this time, we will also had high dose of statin in form of Crestor     at 40 mg once a day.  We will also perform a 2D echocardiogram of     the heart to rule out any systolic dysfunction.  She will have an     elective left heart catheterization tomorrow  morning.  We will also     give aspirin at 81 mg once a day from tomorrow, she got a dose of     325 mg today. 2. Congestive heart failure.  The patient does not have any signs of     volume overload at this time and her shortness of breath has     resolved with Lasix given by the ER physician last night, so we     will hold on diuresis at this time  and we will monitor her for any     signs of volume overload.  Lasix can be restarted if she develops     any symptoms of congestive heart failure.  We will also check a 2D     echo as previously mentioned.  We will start ACE inhibitor with or     without spironolactone at discharge after her catheterization. 3. Hypertension.  The patient's blood pressure has been ranging     between systolic 190-206 in the emergency room, this has come down     a little with nitroglycerin drip.  We will titrate the     nitroglycerin drip to off for a blood pressure less than 140.  We     will add hydralazine IV p.r.n. and Norvasc at 10 mg once a day.     Again, we will also had Coreg at 6.25 mg twice a day as previously     dictated. 4. Diabetes.  We will check an HbA1c.  We will cover the patient with     NovoLog in the form of sliding scale coverage in the hospital.     Metformin can be started at the time of discharge if her creatinine     is stable. 5. Renal insufficiency.  The patient's creatinine is high today at     admission.  We are not sure if this is new or old, but suspect it     is old from her previously uncontrolled blood pressure and     diabetes.  We will monitor that for now.     Bethel Born, MD   ______________________________ Bevelyn Buckles. Kalei Meda, MD    MD/MEDQ  D:  09/30/2010  T:  10/01/2010  Job:  [id number]  Electronically Signed by Bethel Born  on 11/26/2010 12:06:08 PM Electronically Signed by Arvilla Meres MD on 12/17/2010 07:13:42 PM

## 2010-12-18 ENCOUNTER — Encounter (INDEPENDENT_AMBULATORY_CARE_PROVIDER_SITE_OTHER): Payer: Managed Care, Other (non HMO) | Admitting: Cardiology

## 2010-12-18 ENCOUNTER — Ambulatory Visit (HOSPITAL_COMMUNITY): Payer: Managed Care, Other (non HMO)

## 2010-12-18 DIAGNOSIS — I10 Essential (primary) hypertension: Secondary | ICD-10-CM

## 2010-12-20 ENCOUNTER — Ambulatory Visit (HOSPITAL_COMMUNITY): Payer: Managed Care, Other (non HMO)

## 2010-12-23 ENCOUNTER — Ambulatory Visit (HOSPITAL_COMMUNITY): Payer: Managed Care, Other (non HMO)

## 2010-12-24 ENCOUNTER — Encounter: Payer: Self-pay | Admitting: Internal Medicine

## 2010-12-25 ENCOUNTER — Ambulatory Visit (HOSPITAL_COMMUNITY): Payer: Managed Care, Other (non HMO)

## 2010-12-26 ENCOUNTER — Telehealth: Payer: Self-pay | Admitting: Internal Medicine

## 2010-12-26 NOTE — Telephone Encounter (Signed)
Pt needs enalapril script called in with the increase on it. She is running out. Pt to take 2 in am & 2 in pm. [facility] on [address].

## 2010-12-27 ENCOUNTER — Ambulatory Visit (HOSPITAL_COMMUNITY): Payer: Managed Care, Other (non HMO)

## 2010-12-27 MED ORDER — ENALAPRIL MALEATE 2.5 MG PO TABS: ORAL_TABLET | ORAL | Status: DC

## 2010-12-30 ENCOUNTER — Ambulatory Visit (HOSPITAL_COMMUNITY): Payer: Managed Care, Other (non HMO)

## 2010-12-31 NOTE — Discharge Summary (Signed)
NAMEKAMY, POINSETT NO.:  [id number]   MEDICAL RECORD NO.:  [id number]          PATIENT TYPE:  INP   LOCATION:  2922                         FACILITY:  [facility]   PHYSICIAN:  Manning Charity, MD     DATE OF BIRTH:  [birth year]   DATE OF ADMISSION:  12/09/2007  DATE OF DISCHARGE:  12/10/2007                               DISCHARGE SUMMARY   CONTINUITY DOCTOR:  Outpatient clinic.   CONSULTANTS:  None.   DISCHARGE DIAGNOSES:  1. Congestive heart failure exacerbation secondary to hypertensive      urgency.  2. Diabetes type 2 with hemoglobin A1c of 7.5.  3. Hyperlipidemia.  4. Hypertension.   DISCHARGE MEDICATIONS:  1. Lasix 40 mg p.o. daily.  2. Pravastatin 40 mg p.o. at bedtime.  3. Metformin 1000 mg p.o. twice a day.  4. Coreg 3.25 mg by mouth 2 times a day.  5. Benazepril 20 mg by mouth daily.   DISPOSITION AND FOLLOWUP:  Ms.  Sadiya Durand will followup at the  [facility].  She will need during that  appointment C-MET to followup electrolytes.  She will also need a  referral to the Cardiologist for evaluation of CHF.  She might benefit,  from a Myoview, a cath diagnostic.  She will need also a followup of a  liver function test and hepatitis panel result.   PROCEDURE PERFORMED:  A 2D echocardiogram, result is pending.   HISTORY OF PRESENT ILLNESS:  Ms. Lagasse is a 55 year old woman with  past medical history significant for CHF with an ejection fraction of 30-  40% in 2D echo of 2008, diabetes type 2 with the hemoglobin A1c of 7.7  in 2008, hypertension, and hyperlipidemia, who presents complaining of  dyspnea.  She related 5 days prior to admission, she has been having  orthopnea.  She also was complaining of dyspnea on exertion.  She  related the day of admission, at work when she was in a crowded room,  she could not breath.  She relates a prior history of travel only for 12  hours.  She relate dry cough  palpitation but denies chest pain. She was  not taking her medications since January 2009.   PHYSICAL EXAMINATION:  VITAL SIGNS:  Temperature 97.8, blood pressure  169/128, pulse 111, respiration 24, and oxygen saturation 95% on room  air.  GENERAL:  Pleasant, alert, and awake in no acute distress.  HEENT:  Eyes, PERRLA.  Extraocular muscle intact and anicteric.  RESPIRATION:  Bilateral scattered crackles at the base.  No wheezing.  No rhonchi.  Good air movement.  CARDIOVASCULAR:  S1, S2, and S3 regular and normal.  No murmur.  No JVD.  GASTROINTESTINAL:  Bowel sounds positive, soft, nontender, and  nondistended.  No guarding.  EXTREMITIES:  Pulse positive.  +2 edema.   LABORATORY DATA:  Troponin 0.04, CK-MB 3.5, BNP 603, a prior BNP of 331,  sodium 140, potassium 3.9, chloride 107, bicarb 22, BUN 25, creatinine  1.5, and glucose 116.  White blood cells 4.8, hemoglobin 14.3, platelets  262, hematocrit 43.8, MCV 85.3, and ANC  2.9.  Alkaline phosphatase 69,  AST 78, ALT 136, protein 5.9, albumin 3.5, and calcium 9.0.  Chest x-ray  suggestive of congestive heart failure.   HOSPITAL COURSE:  1. CHF exacerbation, likely secondary to hypertensive urgency.  Ms.      Birkland was admitted to a step-down unit.  We had started her on      Lasix 40 IV b.i.d.  She also was started on nitroglycerin drip to      try to decrease her blood pressure less than 25%.  We were also      ruling out other secondary cause of CHF exacerbation, right acute      coronary syndrome with cardiac enzymes x3, negative.  EKG with no      significant changes.  She was admitted to telemetry to rule out      arrhythmia.  Electrolytes were checked also.  She did not have any      sign of infection.  During hospitalization, she lost 10 pounds.      The next day, she was feeling better.  No shortness of breath.   1. Hypertension.  She was started on nitroglycerin drip.  She was not      taking her medications since  February 2009.  We restarted      benazepril and Coreg on the second day of hospitalization.  We      discontinued the nitroglycerin drip.  We will continue with the      Lasix.   1. Transaminitis, unclear etiology. L:abs: alkaline phosphatase 69,      AST 78, ALT 136  Hepatitis panel is pending.  She did not have any      abdominal pain.   1. Diabetes.  Hemoglobin A1c was 7.5.  We discharged her on metformin      1000 mg twice a day.     On the day of discharge, Ms. Sutliff was in very good condition.  Blood pressure 133/100, pulse 84, respirations 20, and oxygen saturation  99% on room air.  Labs, sodium 144, potassium 3.7, chloride 105, bicarb  29, BUN 18, creatinine 1.3, and glucose 135.  White blood cell 5.9,  hemoglobin 13.9, and platelets 260.      Hartley Barefoot, MD  Electronically Signed      Manning Charity, MD  Electronically Signed    BR/MEDQ  D:  12/10/2007  T:  12/11/2007  Job:  [id number]

## 2011-01-01 ENCOUNTER — Ambulatory Visit (HOSPITAL_COMMUNITY): Payer: Managed Care, Other (non HMO)

## 2011-01-03 ENCOUNTER — Ambulatory Visit (HOSPITAL_COMMUNITY): Payer: Managed Care, Other (non HMO)

## 2011-01-03 LAB — HM DIABETES EYE EXAM: HM Diabetic Eye Exam: NORMAL

## 2011-01-03 NOTE — Consult Note (Signed)
NAMEROSA, WYLY NO.:  [id number]   MEDICAL RECORD NO.:  [id number]          PATIENT TYPE:  EMS   LOCATION:  MAJO                         FACILITY:  [facility]   PHYSICIAN:  Cristy Hilts. Jacinto Halim, MD       DATE OF BIRTH:  1956-06-22   DATE OF CONSULTATION:  11/28/2005  DATE OF DISCHARGE:                                   CONSULTATION   REASON FOR ADMISSION:  Shortness of breath, hypertensive urgency.   HISTORY:  Ms. Brittany Evans is a 55 year old African-American female  with a past medical history of hypertension who was on antihypertensive  medications about a year and a half to two years ago. Because of  socioeconomic reasons and she had lost her job and had stopped seeing any  physician. She has been complaining of increasing shortness of breath and  difficulty in lying down in the last 3 weeks and also some chest heaviness.  Given this, she was seen by Earl Lites and was found to have uncontrolled  hypertension and was sent over to the [facility] emergency room for further  cardiovascular evaluation.   On further questioning, the patient states that over the last 3 weeks even  walking about 7-8 steps she gets acutely short of breath and has to sit  down. She has also been waking up in the middle of night feeling short of  breath and gasping for breath which takes about 15-20 minutes to settle down  and then she tries to go back to sleep. Because this has been occurring  almost once or twice per night in the last 3 weeks, she decided to obtain  medical care.   Associated with shortness of breath, she also has some chest discomfort.   REVIEW OF SYSTEMS:  She denies any syncope, she denies any hemoptysis, she  denies any bowel or bladder disturbances. She denies any recent weight gain  or weight loss. She has no symptoms of claudication, she was not a known  diabetic. Other systems were negative.   MEDICATIONS:  None.   ALLERGIES:  No known drug  allergies.   PAST MEDICAL HISTORY:  Significant for hypertension.   PAST SURGICAL HISTORY:  None.   SOCIAL HISTORY:  She is separated, has 2 children, lives with a son who is  75 years of age.   FAMILY HISTORY:  There is no history of premature coronary artery disease or  diabetes in the family.   PHYSICAL EXAMINATION:  GENERAL:  She is well-built, obese who appears to be  in no acute distress.  VITAL SIGNS:  A temperature of 99.3, heart rate is 103 beats/minute. Blood  pressure was 194/119, rechecking it is 183/123 mmHg on both arms equal.  CARDIAC:  S1 and S2 is normal. S4 gallop heard. No murmur appreciated.  CHEST:  Bilaterally clear breath sounds, no crackles.  ABDOMEN:  Obese. No organomegaly.  EXTREMITIES:  2+ edema.  PERIPHERAL VASCULAR:  Normal.   PERTINENT FINDINGS:  EKG done at Dr. Benjie Karvonen office demonstrates  sinus rhythm with evidence of left ventricular hypertrophy.   Her hemoglobin was 11.8, hematocrit  36.9, platelet count 334,000, white  count was 6.2. Her cardiac point of care markers x1was negative. Her BNP was  2. Her electrolytes were normal and BUN was 10, creatinine of 1.1, blood  sugar was elevated at 154. It was also elevated at Dr. Earlene Plater Cloward's  office. Her liver within normal limits.   Chest x-ray was within normal limits per Dr. Jinny Sanders office records.   IMPRESSION:  1.  Hypertensive emergency with symptoms suggestive of acute diastolic heart      failure although at present patient is stable with no acute      exacerbation.  2.  Abnormal EKG in the form of left ventricular hypertrophy.  3.  Anemia, hemoglobin of 11.8, hematocrit 36.9.  4.  Obesity.   RECOMMENDATIONS:  The patient will be admitted to the hospital for better  control of her blood pressure. I had an extensive discussion with the  patient regarding risk factor modification. Cardiac enzymes will be obtained  and myocardial infarction is ruled out. She will be admitted  under telemetry  and further recommendations will follow.      Cristy Hilts. Jacinto Halim, MD  Electronically Signed     JRG/MEDQ  D:  11/28/2005  T:  11/28/2005  Job:  [id number]   cc:   Gabriel Earing, M.D.  Fax: [phone]

## 2011-01-03 NOTE — Discharge Summary (Signed)
Brittany Evans, GAUNA NO.:  [id number]   MEDICAL RECORD NO.:  [id number]          PATIENT TYPE:  INP   LOCATION:  2033                         FACILITY:  [facility]   PHYSICIAN:  Darcella Gasman. [place], N.P.  DATE OF BIRTH:  November 17, 1955   DATE OF ADMISSION:  11/28/2005  DATE OF DISCHARGE:  11/29/2005                                 DISCHARGE SUMMARY   DISCHARGE DIAGNOSES:  1.  Hypertensive emergency, resolved.  2.  Probable acute diastolic heart failure without acute exacerbation.  3.  Abnormal electrocardiogram with left ventricular hypertrophy.  4.  Mild anemia.  5.  Obesity.   DISCHARGE CONDITION:  Improved.   DISCHARGE MEDICATIONS:  1.  Maxzide 37.5/25 mg half tablet daily.  2.  Benazepril 20 mg daily.  3.  Felodipine 10 mg daily.  4.  Coreg 25 mg one-half tablet twice a day.   DISCHARGE INSTRUCTIONS:  1.  Follow up with Dr. Jacinto Halim, and the office will call you with an      appointment date and time.  2.  Low fat, low salt diet.   HISTORY OF PRESENT ILLNESS:  A 55 year old African-American female with a  past medical history of hypertension and was on antihypertensive medicine  for about a year and a half to two years ago.  Unfortunately, due to  socioeconomic reasons, she lost her job and stopped seeing any physician.  Over the last three weeks, she has had increased shortness of breath and  difficulty lying down and chest heaviness.  She was seen by Dr. Earl Evans  at Urgent Care with uncontrolled hypertension and was sent to [facility] Emergency  Room.  The patient stated over the last three weeks, even walking about  seven to eight steps, she gets acutely short of breath, had to sit down.  She was also waking up in the middle of the night feeling short of breath  and gasping for air, and it would take 15 to 20 minutes to feel better.  Blood pressure in the emergency room 194/119, recheck 183/123, both arms  were equal.  She was then admitted to [facility] with  medications given  including IV Labetalol, Lotrel, Lasix, and also Coreg.  She was admitted to  telemetry unit and monitored.  She stayed in sinus rhythm to slight sinus-  tach at 72 to 101.   DISCHARGE PHYSICAL EXAMINATION:  VITAL SIGNS:  Prior to discharge, blood  pressure was 132/92, pulse 75, respirations normal, and temp 98.  HEART:  S1, S2, S4 gallop.  CHEST:  Clear.  ABDOMEN:  Soft.  Positive bowel sounds.   LABORATORY DATA:  Hemoglobin 11.8, hematocrit 36.9, WBC 6.2, platelets 334,  MCV 70.4.  Sodium 139, potassium 3.7, chloride 106, CO2 25, BUN 10,  creatinine 1.1, glucose 154.  LFTs were normal.  Heart:  CK 100, MB 1.1,  troponin 0.04.  Calcium 9.7.  Urinalysis with trace leukocytes, WBCs only 3  to 6, RBCs 0 to 2, and BNP was 200.  T3 uptake was normal at 36.5, T4 was  6.6, and TSH was 3.280.  Chest x-ray had been done with  Urgent Care and  reviewed by Dr. Jacinto Halim and was found to be within normal limits.  Please  note, also, Accu-Cheks were done and they were mildly elevated.  That will  need to be evaluated as an outpatient as well.  Please note, patient was  also taking Mucinex-D as an outpatient.   The patient's blood pressure came down and Dr. Jacinto Halim saw her on November 29, 2005, and felt she was ready for discharge home.  [name] provided  part of her medications.  I wrote for two prescriptions and she will go to  [employer] to see if she can get the $4 generic meds.  We tried write for all  generic, and she will try to talk to family members concerning financial  assistance.  The patient denies any health insurance.      Darcella Gasman. Annie Paras, N.P.     LRI/MEDQ  D:  11/29/2005  T:  11/30/2005  Job:  [id number]

## 2011-01-06 ENCOUNTER — Ambulatory Visit (HOSPITAL_COMMUNITY): Payer: Managed Care, Other (non HMO)

## 2011-01-08 ENCOUNTER — Ambulatory Visit (HOSPITAL_COMMUNITY): Payer: Managed Care, Other (non HMO)

## 2011-01-10 ENCOUNTER — Ambulatory Visit (INDEPENDENT_AMBULATORY_CARE_PROVIDER_SITE_OTHER): Payer: Managed Care, Other (non HMO) | Admitting: Internal Medicine

## 2011-01-10 ENCOUNTER — Ambulatory Visit (HOSPITAL_COMMUNITY): Payer: Managed Care, Other (non HMO)

## 2011-01-10 ENCOUNTER — Encounter: Payer: Self-pay | Admitting: Internal Medicine

## 2011-01-10 ENCOUNTER — Ambulatory Visit (HOSPITAL_COMMUNITY): Payer: Managed Care, Other (non HMO) | Attending: Internal Medicine | Admitting: Radiology

## 2011-01-10 VITALS — BP 122/80 | HR 56 | Ht 65.0 in | Wt 198.0 lb

## 2011-01-10 DIAGNOSIS — I379 Nonrheumatic pulmonary valve disorder, unspecified: Secondary | ICD-10-CM | POA: Insufficient documentation

## 2011-01-10 DIAGNOSIS — I080 Rheumatic disorders of both mitral and aortic valves: Secondary | ICD-10-CM | POA: Insufficient documentation

## 2011-01-10 DIAGNOSIS — I5022 Chronic systolic (congestive) heart failure: Secondary | ICD-10-CM

## 2011-01-10 DIAGNOSIS — I2510 Atherosclerotic heart disease of native coronary artery without angina pectoris: Secondary | ICD-10-CM

## 2011-01-10 DIAGNOSIS — I079 Rheumatic tricuspid valve disease, unspecified: Secondary | ICD-10-CM | POA: Insufficient documentation

## 2011-01-10 MED ORDER — CARVEDILOL 6.25 MG PO TABS: ORAL_TABLET | ORAL | Status: DC

## 2011-01-10 NOTE — Progress Notes (Signed)
History of Present Illness: Primary Cardiologist:  Dr. Arvilla Meres  Brittany Evans is a 55 y.o. female with h/o HTN, HL, DM2, CRI (1.5-1.7) and CHF due to a nonischemic CM. Admitted in February 2012 with NSTEMI peak troponin 5.5.  Underwent cath: EF 20-25%, LM: normal, LAD: 30%,  D1 40%, LCX: 70-80% ostial, RCA: 10-20% mid.  She was treated medically.  At her f/u visit, I  ago reviewed her cath films and flow through ostial LCX looked good. However, apical LAD seemed to have an abrupt occlusion and it was thought that this was possibly the  infarct vessel. Had ab u/s kidneys 9.5-9.6cm bilaterally. Most recent labs with stable CR (1.6) and K 4.5   We have been titrating her ACE-I and b-blocker.   Doing great. On Wednesday walked for 1 hour 45 mins without problem. No dyspnea or CP. Taking meds without problem. No edema, orthopnea or PND. Weight down 7 pounds.   Had echo today which I reviewed personally EF 45-50%.   Past Medical History  Diagnosis Date  . Systolic CHF   . Diabetes mellitus   . Hypertension   . Asthma   . NICM (nonischemic cardiomyopathy)     EF 20-25%  . CAD (coronary artery disease)     NSTEMI 2/12: LAD 30%, D1 40%, oCFX 70-80%, mRCA 10-20%, ?occl. of apical LAD; treated medically  . Hyperlipidemia   . Obesity     Current Outpatient Prescriptions  Medication Sig Dispense Refill  . albuterol (PROAIR HFA) 108 (90 BASE) MCG/ACT inhaler Inhale 2 puffs into the lungs every 6 (six) hours as needed.        Marland Kitchen aspirin 325 MG tablet Take 325 mg by mouth daily.        . carvedilol (COREG) 6.25 MG tablet Take 1 & 1/2 tabs Twice daily  90 tablet  3  . enalapril (VASOTEC) 2.5 MG tablet Take 2 tablets two times daily  120 tablet  3  . hydrALAZINE (APRESOLINE) 25 MG tablet Take 25 mg by mouth 3 (three) times daily.        . Linagliptin (TRADJENTA) 5 MG TABS 1 tab po qd       . Multiple Vitamin (MULTIVITAMIN) tablet Take 1 tablet by mouth daily. One-A-Day MV       .  nitroGLYCERIN (NITROSTAT) 0.4 MG SL tablet Place 0.4 mg under the tongue every 5 (five) minutes as needed.        . rosuvastatin (CRESTOR) 40 MG tablet Take 40 mg by mouth daily.        Marland Kitchen spironolactone (ALDACTONE) 25 MG tablet Take 1 tablet by mouth daily.      Marland Kitchen DISCONTD: isosorbide mononitrate (IMDUR) 30 MG 24 hr tablet Take 30 mg by mouth daily.          No Known Allergies  Vital Signs: BP 122/80  Pulse 50  Ht 5\' 5"  (1.651 m)  Wt 198 lb (89.812 kg)  BMI 32.95 kg/m2  PHYSICAL EXAM: Well nourished, well developed, in no acute distress HEENT: normal Neck: no JVD Cardiac:  normal S1, S2; RRR; no murmur Lungs:  clear to auscultation bilaterally, no wheezing, rhonchi or rales Abd: soft, nontender, no hepatomegaly Ext: no edema Skin: warm and dry Neuro:  CNs 2-12 intact, no focal abnormalities noted  ASSESSMENT AND PLAN:

## 2011-01-10 NOTE — Assessment & Plan Note (Signed)
No evidence of ischemia. Continue current regimen. Goal LDL < 70. Keep exercise program going.

## 2011-01-10 NOTE — Assessment & Plan Note (Signed)
Doing very well. NYHA I. EF nearly fully recovered. Will increase carvedilol to 12.5 bid as HR tolerates as she is having trouble splitting pills. If feels bad can go back to 9.375 bid. Will keep on meds for at least 1 year and if EF recovers fully can consider slow taper.

## 2011-01-10 NOTE — Patient Instructions (Signed)
Increase Carvedilol to 12.5 mg Twice daily (2 tabs twice a day) Your physician recommends that you schedule a follow-up appointment in: 6 weeks

## 2011-01-13 ENCOUNTER — Ambulatory Visit (HOSPITAL_COMMUNITY): Payer: Managed Care, Other (non HMO)

## 2011-01-15 ENCOUNTER — Ambulatory Visit (HOSPITAL_COMMUNITY): Payer: Managed Care, Other (non HMO)

## 2011-01-17 ENCOUNTER — Ambulatory Visit (HOSPITAL_COMMUNITY): Payer: Managed Care, Other (non HMO)

## 2011-01-20 ENCOUNTER — Ambulatory Visit (HOSPITAL_COMMUNITY): Payer: Managed Care, Other (non HMO)

## 2011-01-22 ENCOUNTER — Other Ambulatory Visit: Payer: Self-pay | Admitting: *Deleted

## 2011-01-22 ENCOUNTER — Ambulatory Visit (HOSPITAL_COMMUNITY): Payer: Managed Care, Other (non HMO)

## 2011-01-22 MED ORDER — LINAGLIPTIN 5 MG PO TABS: 5.0000 mg | ORAL_TABLET | Freq: Every day | ORAL | Status: DC

## 2011-01-22 NOTE — Telephone Encounter (Signed)
Pt requesting samples for Tradgenta 5mg --none available Ok VO Dr. Yetta Barre to send in Rx to pharmacy. Pharmacy verified by Pt. Rx Done.

## 2011-01-24 ENCOUNTER — Ambulatory Visit (HOSPITAL_COMMUNITY): Payer: Managed Care, Other (non HMO)

## 2011-01-27 ENCOUNTER — Ambulatory Visit (HOSPITAL_COMMUNITY): Payer: Managed Care, Other (non HMO)

## 2011-01-29 ENCOUNTER — Ambulatory Visit (HOSPITAL_COMMUNITY): Payer: Managed Care, Other (non HMO)

## 2011-01-31 ENCOUNTER — Ambulatory Visit (HOSPITAL_COMMUNITY): Payer: Managed Care, Other (non HMO)

## 2011-02-21 ENCOUNTER — Ambulatory Visit: Payer: Managed Care, Other (non HMO) | Admitting: Internal Medicine

## 2011-02-25 ENCOUNTER — Telehealth: Payer: Self-pay | Admitting: Internal Medicine

## 2011-02-25 ENCOUNTER — Encounter: Payer: Self-pay | Admitting: Internal Medicine

## 2011-02-25 NOTE — Telephone Encounter (Signed)
Spoke w/[facility] pharmacy; Rx for Brittany Evans is available per the prescription sent in last month [#30x5]. LMOM for Pt to inform her to please go to her pharmacy for this medication.

## 2011-02-25 NOTE — Telephone Encounter (Signed)
Per pt call, pt wanting to know if she needs to be on diabetic pill tradjenta or not, pt has been out of medication for two months.  Pt said a RX was never called in to refill pt RX.  Pt said Dr. Gala Romney kept saying he called in RX but when pt went to pick it up at pharmacy it was never called in.

## 2011-02-25 NOTE — Telephone Encounter (Signed)
Advised med needs to come from Dr Yetta Barre and that it was sent to [facility] on 6/6 with 5 refills she states [facility] states they don't have rx for med, needs to come from Dr Yetta Barre, will send mess to his office

## 2011-03-27 ENCOUNTER — Telehealth: Payer: Self-pay | Admitting: Internal Medicine

## 2011-05-02 ENCOUNTER — Inpatient Hospital Stay (INDEPENDENT_AMBULATORY_CARE_PROVIDER_SITE_OTHER)
Admission: RE | Admit: 2011-05-02 | Discharge: 2011-05-02 | Disposition: A | Discharge status: Home / Self Care | Payer: Managed Care, Other (non HMO) | Source: Ambulatory Visit | Attending: Family Medicine | Admitting: Family Medicine

## 2011-05-02 ENCOUNTER — Inpatient Hospital Stay (HOSPITAL_COMMUNITY)
Admission: EM | Admit: 2011-05-02 | Discharge: 2011-05-04 | DRG: 310 | Disposition: A | Discharge status: Home Health | Payer: Managed Care, Other (non HMO) | Attending: Internal Medicine | Admitting: Internal Medicine

## 2011-05-02 DIAGNOSIS — N182 Chronic kidney disease, stage 2 (mild): Secondary | ICD-10-CM | POA: Diagnosis present

## 2011-05-02 DIAGNOSIS — I428 Other cardiomyopathies: Secondary | ICD-10-CM | POA: Diagnosis present

## 2011-05-02 DIAGNOSIS — I498 Other specified cardiac arrhythmias: Principal | ICD-10-CM | POA: Diagnosis present

## 2011-05-02 DIAGNOSIS — Z7982 Long term (current) use of aspirin: Secondary | ICD-10-CM

## 2011-05-02 DIAGNOSIS — I495 Sick sinus syndrome: Secondary | ICD-10-CM

## 2011-05-02 DIAGNOSIS — E669 Obesity, unspecified: Secondary | ICD-10-CM | POA: Diagnosis present

## 2011-05-02 DIAGNOSIS — I2510 Atherosclerotic heart disease of native coronary artery without angina pectoris: Secondary | ICD-10-CM | POA: Diagnosis present

## 2011-05-02 DIAGNOSIS — R55 Syncope and collapse: Secondary | ICD-10-CM

## 2011-05-02 DIAGNOSIS — J45909 Unspecified asthma, uncomplicated: Secondary | ICD-10-CM | POA: Diagnosis present

## 2011-05-02 DIAGNOSIS — R42 Dizziness and giddiness: Secondary | ICD-10-CM

## 2011-05-02 DIAGNOSIS — E119 Type 2 diabetes mellitus without complications: Secondary | ICD-10-CM | POA: Diagnosis present

## 2011-05-02 DIAGNOSIS — E785 Hyperlipidemia, unspecified: Secondary | ICD-10-CM | POA: Diagnosis present

## 2011-05-02 DIAGNOSIS — I509 Heart failure, unspecified: Secondary | ICD-10-CM | POA: Diagnosis present

## 2011-05-02 DIAGNOSIS — I129 Hypertensive chronic kidney disease with stage 1 through stage 4 chronic kidney disease, or unspecified chronic kidney disease: Secondary | ICD-10-CM | POA: Diagnosis present

## 2011-05-02 LAB — DIFFERENTIAL
Basophils Absolute: 0 10*3/uL (ref 0.0–0.1)
Eosinophils Relative: 3 % (ref 0–5)
Lymphocytes Relative: 34 % (ref 12–46)
Lymphs Abs: 1.8 10*3/uL (ref 0.7–4.0)
Monocytes Absolute: 0.4 10*3/uL (ref 0.1–1.0)
Neutro Abs: 3 10*3/uL (ref 1.7–7.7)

## 2011-05-02 LAB — COMPREHENSIVE METABOLIC PANEL
ALT: 22 U/L (ref 0–35)
AST: 20 U/L (ref 0–37)
Alkaline Phosphatase: 63 U/L (ref 39–117)
CO2: 25 mEq/L (ref 19–32)
Calcium: 9.3 mg/dL (ref 8.4–10.5)
Chloride: 107 mEq/L (ref 96–112)
GFR calc Af Amer: >60 mL/min (ref >60)
GFR calc non Af Amer: 50 mL/min — ABNORMAL LOW (ref >60)
Glucose, Bld: 95 mg/dL (ref 70–99)
Potassium: 3.7 mEq/L (ref 3.5–5.1)
Sodium: 141 mEq/L (ref 135–145)
Total Bilirubin: 0.4 mg/dL (ref 0.3–1.2)

## 2011-05-02 LAB — CBC
HCT: 34.4 % — ABNORMAL LOW (ref 36.0–46.0)
Hemoglobin: 12.1 g/dL (ref 12.0–15.0)
MCHC: 35.2 g/dL (ref 30.0–36.0)
MCV: 86.9 fL (ref 78.0–100.0)
RDW: 13 % (ref 11.5–15.5)

## 2011-05-02 LAB — POCT I-STAT TROPONIN I

## 2011-05-03 ENCOUNTER — Inpatient Hospital Stay (HOSPITAL_COMMUNITY): Payer: Managed Care, Other (non HMO)

## 2011-05-03 DIAGNOSIS — R55 Syncope and collapse: Secondary | ICD-10-CM

## 2011-05-03 LAB — BASIC METABOLIC PANEL
BUN: 23 mg/dL (ref 6–23)
CO2: 24 mEq/L (ref 19–32)
Calcium: 9.3 mg/dL (ref 8.4–10.5)
Creatinine, Ser: 0.99 mg/dL (ref 0.50–1.10)
GFR calc non Af Amer: 58 mL/min — ABNORMAL LOW (ref >60)
Glucose, Bld: 94 mg/dL (ref 70–99)
Sodium: 145 mEq/L (ref 135–145)

## 2011-05-03 LAB — GLUCOSE, CAPILLARY: Glucose-Capillary: 103 mg/dL — ABNORMAL HIGH (ref 70–99)

## 2011-05-03 LAB — TSH: TSH: 1.602 u[IU]/mL (ref 0.350–4.500)

## 2011-05-04 DIAGNOSIS — R55 Syncope and collapse: Secondary | ICD-10-CM

## 2011-05-04 LAB — BASIC METABOLIC PANEL
BUN: 19 mg/dL (ref 6–23)
Chloride: 108 mEq/L (ref 96–112)
Creatinine, Ser: 1.04 mg/dL (ref 0.50–1.10)
GFR calc Af Amer: >60 mL/min (ref >60)
GFR calc non Af Amer: 55 mL/min — ABNORMAL LOW (ref >60)
Potassium: 3.6 mEq/L (ref 3.5–5.1)

## 2011-05-04 LAB — GLUCOSE, CAPILLARY: Glucose-Capillary: 101 mg/dL — ABNORMAL HIGH (ref 70–99)

## 2011-05-04 NOTE — H&P (Signed)
[name], Brittany Evans NO.:  [id number]  MEDICAL RECORD NO.:  [id number]  LOCATION:  2041                         FACILITY:  [facility]  PHYSICIAN:  Marca Ancona, MD      DATE OF BIRTH:  [birth year]  DATE OF ADMISSION:  05/02/2011 DATE OF DISCHARGE:                             HISTORY & PHYSICAL   PRIMARY CARDIOLOGIST:  Bevelyn Buckles. Bensimhon, MD  PRIMARY CARE PHYSICIAN:  [facility].  HISTORY OF PRESENT ILLNESS:  This is a 55 year old with history of coronary artery disease and predominantly nonischemic cardiomyopathy, who presented with a presyncopal episode tonight.  Today, the patient had been standing at a counter, worked about 20 minutes in 1 place.  She suddenly felt lightheaded and diaphoretic and her vision got very blurry.  She did not feel any tachy palpitations.  She did sit down and put a wet towel over her face and with this maneuver, she felt better. She had no loss of consciousness.  She has had no lightheaded spells since that time and she has had no lightheaded spells or syncope that she can remember prior to that time.  She came to the emergency department for evaluation in the ER.  Her heart rate has been in the 40s with a systolic blood pressure in the 170s.  She says that back in the spring, she actually had an episode where her heart rate was in the 30s, however, this resolved.  She has no history of ventricular tachycardia. Her last EF was 45-50%.  She has had no chest pain today.  She walks about 3 miles several times a week with no chest pain or exertional dyspnea.  MEDICATIONS: 1. Aspirin 325 mg daily. 2. Albuterol p.r.n. 3. Coreg 12.5 mg b.i.d. 4. Enalapril 5 mg b.i.d. 5. Hydralazine 25 mg t.i.d. 6. Lamictal 5 mg daily. 7. Crestor 40 mg daily. 8. Spironolactone 25 mg daily. 9. Imdur 30 mg daily.  PAST MEDICAL HISTORY: 1. Hypertension. 2. Hyperlipidemia. 3. Chronic kidney disease. 4. Diabetes. 5. Predominant nonischemic  cardiomyopathy.  The patient has depressed     LV systolic function dates to at least 2008.  She had an echo in     February 2012, with EF 30-35% and EF was 20-25% by a left     ventriculogram in February 2012 also.  Echo in May 2012, showed EF     recovered to 45-50% with diffuse hypokinesis and mild LVH. 6. CAD.  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  Left heart     catheterization showed a 70% ostial circumflex with total occlusion     of the distal LAD that was likely the infarct-related vessel.  She     was treated medically. 7. Obesity. 8. Asthma.  SOCIAL HISTORY:  The patient lives in [place] with her daughter.  She works in [employer].  She does not smoke or drink any alcohol.  FAMILY HISTORY:  Mother had CAD with PCI at around age 68.  Father had alcoholic cirrhosis.  REVIEW OF SYSTEMS:  All systems were reviewed and were negative except as noted in the history of present illness.  PHYSICAL EXAMINATION:  VITAL SIGNS:  Temperature 98.3, pulse in the  40s and regular, blood pressure 174/90.  On telemetry, the patient is in sinus brady in the 40s for most of the time.  Occasionally, she jumps up into the 50s, oxygen saturation 100% on 2 liters nasal cannula. GENERAL:  This is a well-developed female, in no apparent stress. HEENT:  Normal. ABDOMEN:  Soft, nontender.  No hepatosplenomegaly. NECK:  There is no thyromegaly or thyroid nodule.  There is no JVD. CARDIOVASCULAR:  Heart regular, S1 and S2.  No S3, no S4.  There is no murmur.  There is no carotid bruit. EXTREMITIES:  No clubbing or cyanosis.  EKG shows sinus bradycardia with rate of 41 with left ventricular hypertrophy and lateral T-wave inversions.  LABORATORY DATA:  White count 5.5, hematocrit 34.4, platelets 217. Potassium 3.7, creatinine 1.14.  LFTs normal.  Troponin 0.01.  IMPRESSION:  This is a 55 year old with predominantly nonischemic cardiomyopathy and coronary artery disease who  presents with the presyncopal episode.  1. Presyncope.  The differential diagnosis for this includes     bradycardia, vagal event, and tachyarrhythmia with just ventricular     tachycardia.  Vagal event is certainly possible as the patient had     been standing for a prolonged period of time in one place when she     became lightheaded, however, heart rate remains in the 40s long     after the event.  The patient certainly has some trace of     ventricular tachycardia with her prior myocardial infarction     involving an occluded distal left anterior descending coronary     artery, but the last EF was only mildly depressed at 45-50%.     Workup will include placing the patient on telemetry.  I would have     them check her heart rate with ambulation in the hall.  We are     going to hold her carvedilol for now although I would like     eventually start her back on a low dose.  We will check a TSH.  If     her heart rate comes up, once we held the Coreg, I will send her     home with a 3-week monitor just to rule out ventricular tachycardia     and ideally we will resume a low dose of Coreg at 3.125 mg b.i.d. 2. Congestive heart failure.  The patient's last EF was 45-50%.  Given     the presyncope, I would repeat an echo to     reassess the EF.  Her volume looks okay.  I will continue all her     home meds except carvedilol which will be held.  She has [place]     Heart Association class I-II symptoms. 3. Coronary artery disease, this is stable without ischemic symptoms.     Marca Ancona, MD     DM/MEDQ  D:  05/03/2011  T:  05/03/2011  Job:  [id number]  Electronically Signed by Marca Ancona MD on 05/04/2011 12:22:47 AM

## 2011-05-05 LAB — GLUCOSE, CAPILLARY: Glucose-Capillary: 102 mg/dL — ABNORMAL HIGH (ref 70–99)

## 2011-05-09 NOTE — Discharge Summary (Signed)
NAMEBENIGNA, DELISI NO.:  [id number]  MEDICAL RECORD NO.:  [id number]  LOCATION:  2041                         FACILITY:  [facility]  PHYSICIAN:  Cassell Clement, M.D. DATE OF BIRTH:  12-09-1955  DATE OF ADMISSION:  05/02/2011 DATE OF DISCHARGE:  05/04/2011                              DISCHARGE SUMMARY   PRIMARY CARDIOLOGIST:  Bevelyn Buckles. Bensimhon, MD  Primary care is provided at [facility].  DISCHARGE DIAGNOSIS:  Presyncope.  SECONDARY DIAGNOSES: 1. Bradycardia requiring reduction of beta-blocker dose. 2. Coronary artery disease which is medically managed. 3. History of chronic systolic congestive heart failure with     normalized left ventricular function and ejection fraction of 65-     70% by echocardiogram this admission. 4. Stage II chronic kidney disease. 5. Diabetes mellitus. 6. Hypertension. 7. Hyperlipidemia. 8. Asthma. 9. Obesity.  ALLERGIES:  No known drug allergies.  PROCEDURES:  Two-D echocardiogram performed on May 04, 2011, showing ejection fraction of 65-70%, normal wall motion, and no regional wall motion abnormalities.  Grade 1 diastolic dysfunction.  Mild left ventricular hypertrophy.  HISTORY OF PRESENT ILLNESS:  This is a 55 year old female with the above problem list who was in her usual state of health until the day of admission when she was standing for approximately 20 minutes and had sudden onset of lightheadedness and diaphoresis with blurred vision. She sat and did feel better.  She never lost consciousness.  Because of this event, however, she presented to [facility] ED where she was found to be bradycardic with heart rates in the 40s and occasional dipped into the 30s.  Laboratory evaluation was otherwise unrevealing and she had no acute changes on her EKG.  Her home dose of beta-blocker (12.5 mg b.i.d.) was held and the patient was admitted for further evaluation.  HOSPITAL COURSE:  Initially, the patient had  multiple bradycardic episodes especially during sleep with rates dipping into the high 20s to 40s.  Her bradycardia did improve, however, by May 03, 2011, by holding her carvedilol.  Two-D echocardiogram was undertaken on May 04, 2011, showing normalized LV function up from previously documented EF of 30-35% in February 2012.  We have therefore decided to reinitiate lower-dose carvedilol therapy at 3.125 mg b.i.d.  We will also arrange for an outpatient 21-day event monitor and plan to discharge the patient today in good condition.  DISCHARGE LABORATORY DATA:  Hemoglobin 12.1, hematocrit 34.4, WBC 5.5, and platelets 217.  Sodium 144, potassium 3.6, chloride 108, CO2 of 28, BUN 19, creatinine 1.04, glucose 99, total bilirubin 0.4, alkaline phosphatase 63, AST 20, ALT 22, total protein 71, albumin 4.1, calcium 9.5.  Troponin I of 0.01.  BNP 74.2.  TSH 1.602.  Digoxin level less than 0.3.  The patient's discharge weight is 87.9 kg.  DISPOSITION:  The patient will be discharged home today in good condition.  FOLLOWUP PLANS AND APPOINTMENTS:  We will arrange for a 21-day event monitor followed by heart failure followup with Dr. Gala Romney.  She will follow up with [facility] as previously scheduled.  DISCHARGE MEDICATIONS: 1. Carvedilol 3.125 mg b.i.d. 2. Aspirin 81 mg daily. 3. Hydralazine 50 mg t.i.d. 4. Enalapril 5 mg b.i.d. 5. Imdur 30 mg  daily. 6. Multivitamin 1 tablet daily. 7. Nitroglycerin 0.4 mg sublingual p.r.n. chest pain. 8. Rosuvastatin 40 mg nightly. 9. Spirolactone 25 mg daily. 10.Tradjenta 5 mg daily.  OUTSTANDING LABORATORY DATA AND STUDIES:  Followup 21-day event monitor.  DURATION OF DISCHARGE ENCOUNTER:  40 minutes including physician time.     Nicolasa Ducking, ANP   ______________________________ Cassell Clement, M.D.    CB/MEDQ  D:  05/04/2011  T:  05/04/2011  Job:  [id number]  cc:   Clinic [facility]  Electronically Signed by  Nicolasa Ducking ANP on 05/08/2011 03:51:46 PM Electronically Signed by Cassell Clement M.D. on 05/09/2011 12:54:02 PM

## 2011-05-13 LAB — CARDIAC PANEL(CRET KIN+CKTOT+MB+TROPI)
CK, MB: 2.1
CK, MB: 2.6
Relative Index: 1.8
Total CK: 120
Total CK: 141

## 2011-05-13 LAB — DIFFERENTIAL
Basophils Absolute: 0
Basophils Relative: 1
Eosinophils Absolute: 0.1
Monocytes Absolute: 0.4
Monocytes Relative: 9

## 2011-05-13 LAB — CBC
Hemoglobin: 14.3
MCHC: 32.6
MCHC: 33.3
MCV: 85.3
RBC: 5.14 — ABNORMAL HIGH
RDW: 14.9
RDW: 15

## 2011-05-13 LAB — COMPREHENSIVE METABOLIC PANEL
AST: 78 — ABNORMAL HIGH
BUN: 20
CO2: 25
Chloride: 105
Creatinine, Ser: 1.22 — ABNORMAL HIGH
GFR calc non Af Amer: 46 — ABNORMAL LOW
Glucose, Bld: 131 — ABNORMAL HIGH
Total Bilirubin: 1.4 — ABNORMAL HIGH

## 2011-05-13 LAB — BASIC METABOLIC PANEL
CO2: 29
Calcium: 9.1
GFR calc Af Amer: 51 — ABNORMAL LOW
GFR calc non Af Amer: 42 — ABNORMAL LOW
Glucose, Bld: 135 — ABNORMAL HIGH
Potassium: 3.7
Sodium: 144

## 2011-05-13 LAB — RAPID URINE DRUG SCREEN, HOSP PERFORMED
Amphetamines: NOT DETECTED
Barbiturates: NOT DETECTED
Benzodiazepines: NOT DETECTED
Cocaine: NOT DETECTED
Opiates: NOT DETECTED
Tetrahydrocannabinol: NOT DETECTED

## 2011-05-13 LAB — POCT I-STAT, CHEM 8
Calcium, Ion: 1.17
Glucose, Bld: 160 — ABNORMAL HIGH
HCT: 46
Hemoglobin: 15.6 — ABNORMAL HIGH
TCO2: 22

## 2011-05-13 LAB — HEPATIC FUNCTION PANEL
Alkaline Phosphatase: 63
Bilirubin, Direct: 0.2
Indirect Bilirubin: 1.2 — ABNORMAL HIGH
Total Bilirubin: 1.4 — ABNORMAL HIGH

## 2011-05-13 LAB — D-DIMER, QUANTITATIVE: D-Dimer, Quant: 0.82 — ABNORMAL HIGH

## 2011-05-13 LAB — HEPATITIS PANEL, ACUTE
HCV Ab: NEGATIVE
Hep B C IgM: NEGATIVE
Hepatitis B Surface Ag: NEGATIVE

## 2011-05-13 LAB — LIPID PANEL
Cholesterol: 177
LDL Cholesterol: 112 — ABNORMAL HIGH

## 2011-05-13 LAB — URINALYSIS, ROUTINE W REFLEX MICROSCOPIC
Bilirubin Urine: NEGATIVE
Hgb urine dipstick: NEGATIVE
Ketones, ur: NEGATIVE
Nitrite: NEGATIVE
Specific Gravity, Urine: 1.007
Urobilinogen, UA: 0.2

## 2011-05-13 LAB — CK TOTAL AND CKMB (NOT AT ARMC): Total CK: 188 — ABNORMAL HIGH

## 2011-05-13 LAB — TROPONIN I: Troponin I: 0.04

## 2011-05-15 ENCOUNTER — Telehealth: Payer: Self-pay | Admitting: Internal Medicine

## 2011-05-15 DIAGNOSIS — R55 Syncope and collapse: Secondary | ICD-10-CM

## 2011-05-15 NOTE — Telephone Encounter (Signed)
Pt was concerned about setting up appt for monitor she hasnt heard anything

## 2011-05-15 NOTE — Telephone Encounter (Signed)
I spoke with the pt and she was discharged from the hospital on 05/04/11.  Per DC instructions the pt needs a 21 day event monitor and follow-up appt (EPH) with Dr Gala Romney. I placed order for event monitor and spoke with Marlowe Kays about the pt needing to be contacted for follow-up.  She will call the pt.

## 2011-05-16 ENCOUNTER — Encounter (INDEPENDENT_AMBULATORY_CARE_PROVIDER_SITE_OTHER): Payer: Managed Care, Other (non HMO)

## 2011-05-16 DIAGNOSIS — R55 Syncope and collapse: Secondary | ICD-10-CM

## 2011-06-12 ENCOUNTER — Telehealth: Payer: Self-pay | Admitting: Cardiology

## 2011-06-12 NOTE — Telephone Encounter (Signed)
Pt walked in signed ROI, all Records we mailed to her 06/12/11/km

## 2011-06-23 ENCOUNTER — Other Ambulatory Visit: Payer: Self-pay

## 2011-06-23 MED ORDER — ROSUVASTATIN CALCIUM 40 MG PO TABS: 40.0000 mg | ORAL_TABLET | Freq: Every day | ORAL | Status: DC

## 2011-08-08 ENCOUNTER — Other Ambulatory Visit: Payer: Self-pay

## 2011-08-08 ENCOUNTER — Encounter (HOSPITAL_COMMUNITY): Payer: Self-pay | Admitting: *Deleted

## 2011-08-08 ENCOUNTER — Emergency Department (HOSPITAL_COMMUNITY)
Admission: EM | Admit: 2011-08-08 | Discharge: 2011-08-08 | Disposition: A | Discharge status: Home / Self Care | Payer: Managed Care, Other (non HMO) | Attending: Emergency Medicine | Admitting: Emergency Medicine

## 2011-08-08 DIAGNOSIS — Y9289 Other specified places as the place of occurrence of the external cause: Secondary | ICD-10-CM | POA: Insufficient documentation

## 2011-08-08 DIAGNOSIS — E119 Type 2 diabetes mellitus without complications: Secondary | ICD-10-CM | POA: Insufficient documentation

## 2011-08-08 DIAGNOSIS — I10 Essential (primary) hypertension: Secondary | ICD-10-CM | POA: Insufficient documentation

## 2011-08-08 DIAGNOSIS — T1490XA Injury, unspecified, initial encounter: Secondary | ICD-10-CM | POA: Insufficient documentation

## 2011-08-08 DIAGNOSIS — I2510 Atherosclerotic heart disease of native coronary artery without angina pectoris: Secondary | ICD-10-CM | POA: Insufficient documentation

## 2011-08-08 DIAGNOSIS — R209 Unspecified disturbances of skin sensation: Secondary | ICD-10-CM | POA: Insufficient documentation
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
## 2011-08-11 ENCOUNTER — Ambulatory Visit: Payer: Managed Care, Other (non HMO) | Admitting: Internal Medicine

## 2011-08-16 NOTE — ED Provider Notes (Signed)
Medical screening examination/treatment/procedure(s) were performed by non-physician practitioner and as supervising physician I was immediately available for consultation/collaboration.  Raeford Razor, MD 08/16/11 [phone]

## 2011-08-25 ENCOUNTER — Encounter: Payer: Self-pay | Admitting: Internal Medicine

## 2011-08-26 ENCOUNTER — Other Ambulatory Visit (HOSPITAL_COMMUNITY): Payer: Self-pay | Admitting: Family Medicine

## 2011-08-26 DIAGNOSIS — Z1231 Encounter for screening mammogram for malignant neoplasm of breast: Secondary | ICD-10-CM

## 2011-08-27 ENCOUNTER — Ambulatory Visit (HOSPITAL_COMMUNITY)
Admission: RE | Admit: 2011-08-27 | Discharge: 2011-08-27 | Disposition: A | Discharge status: Home / Self Care | Payer: No Typology Code available for payment source | Source: Ambulatory Visit | Attending: Family Medicine | Admitting: Family Medicine

## 2011-08-27 DIAGNOSIS — Z1231 Encounter for screening mammogram for malignant neoplasm of breast: Secondary | ICD-10-CM

## 2012-11-04 ENCOUNTER — Other Ambulatory Visit (HOSPITAL_COMMUNITY): Payer: Self-pay | Admitting: Internal Medicine

## 2012-11-23 ENCOUNTER — Ambulatory Visit (HOSPITAL_COMMUNITY)
Admission: RE | Admit: 2012-11-23 | Discharge: 2012-11-23 | Disposition: A | Discharge status: Home / Self Care | Payer: Self-pay | Source: Ambulatory Visit | Attending: Internal Medicine | Admitting: Internal Medicine

## 2012-11-23 DIAGNOSIS — Z1231 Encounter for screening mammogram for malignant neoplasm of breast: Secondary | ICD-10-CM

## 2013-01-08 IMAGING — US US ABDOMEN COMPLETE
1 series · 13 of 25 positions shown · non-contrast
Comparison: None.

CLINICAL DATA: Elevated LFTs.  Diabetic or hypertensive
medication.

COMPLETE ABDOMINAL ULTRASOUND

[Series 1: us abdomen complete · 0.35mm/px · 13 of 83 slices shown]
[im 1/83]
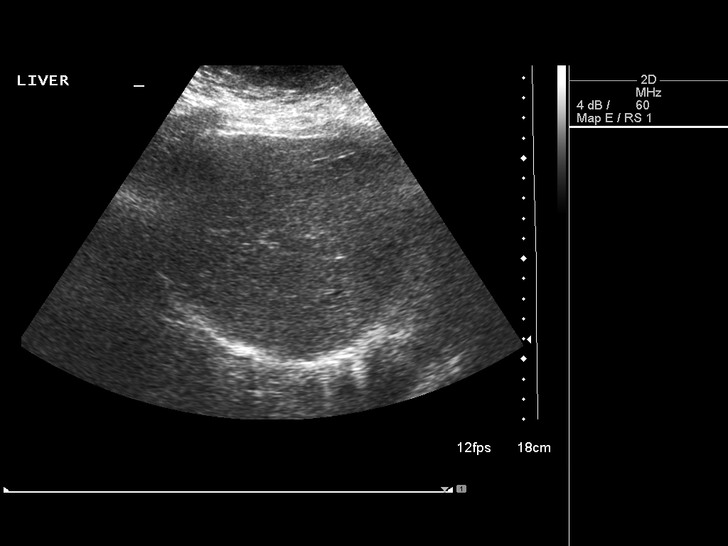
[im 7/83]
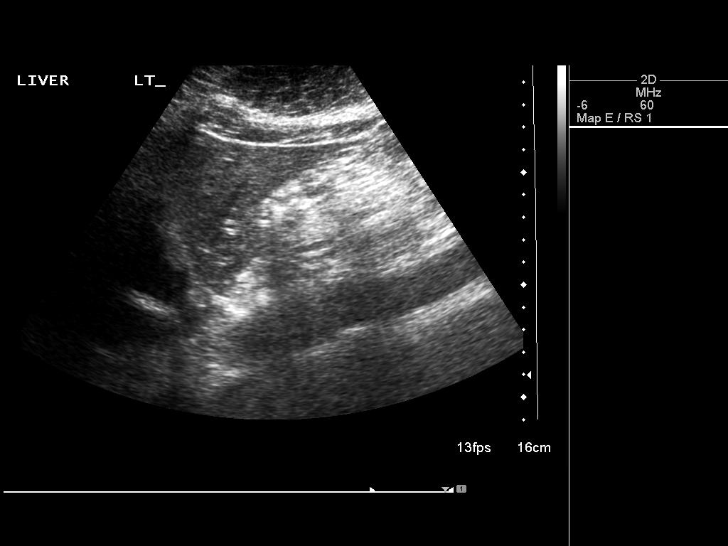
[im 14/83]
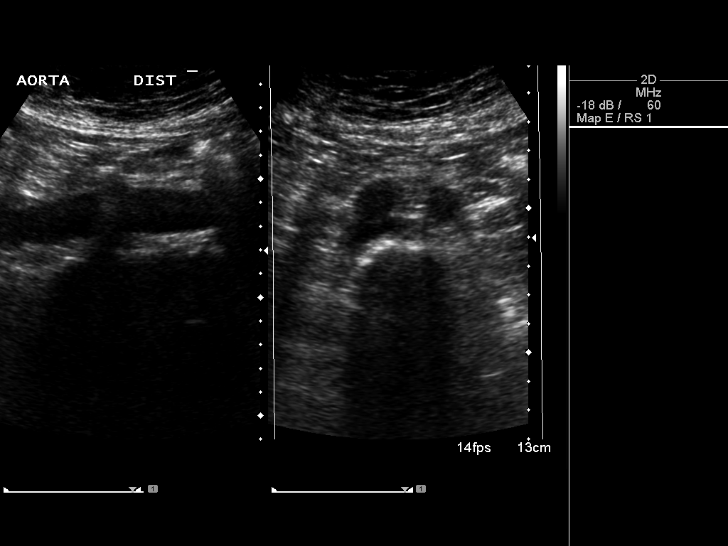
[im 21/83]
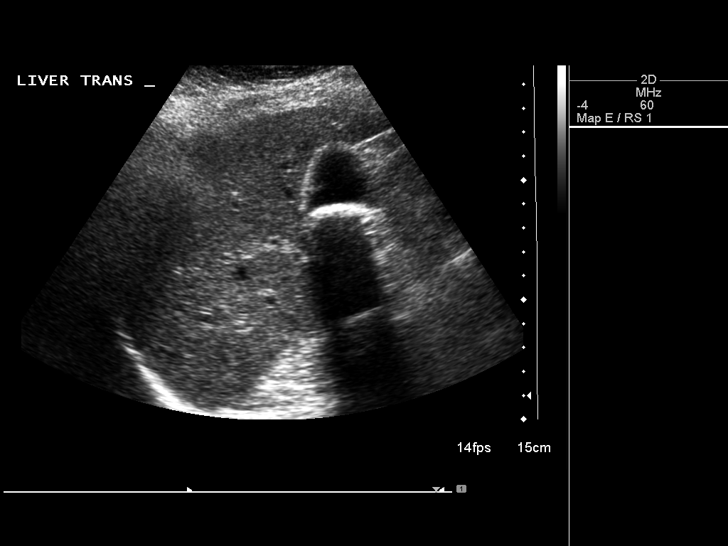
[im 28/83]
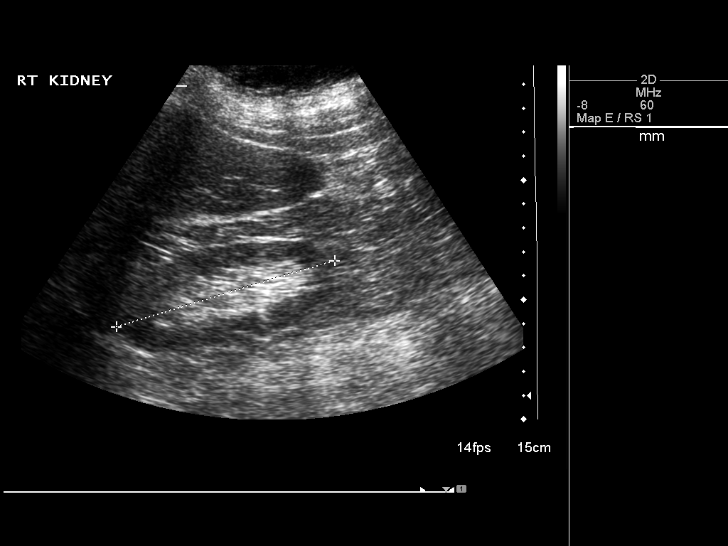
[im 35/83]
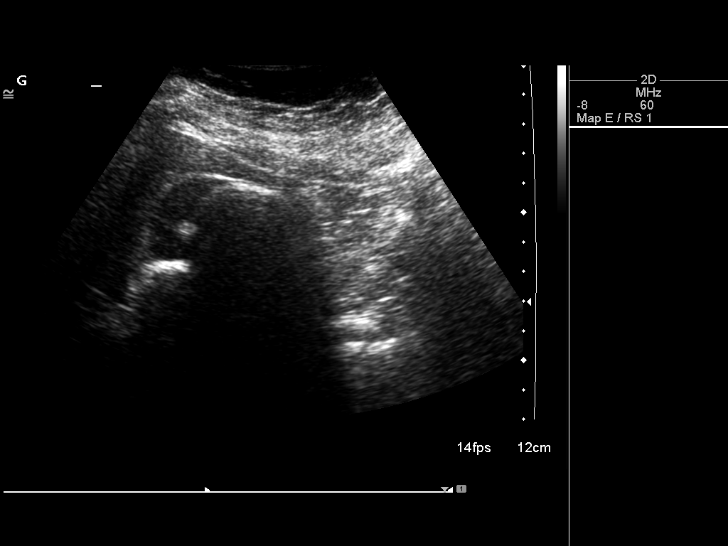
[im 42/83]
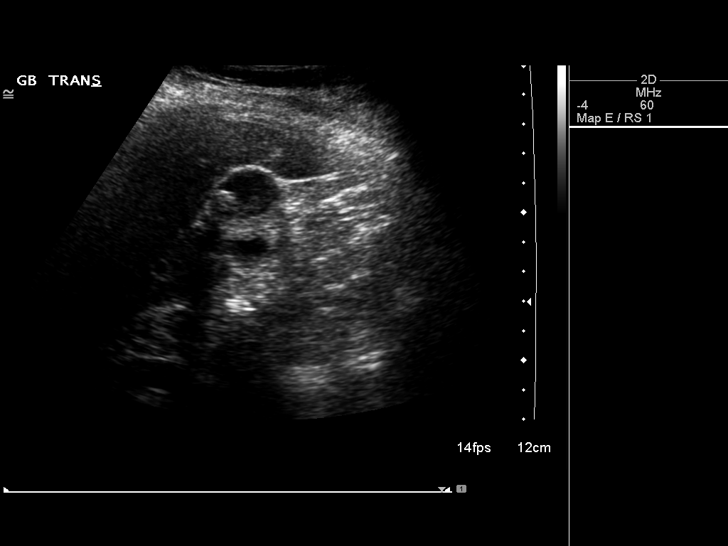
[im 48/83]
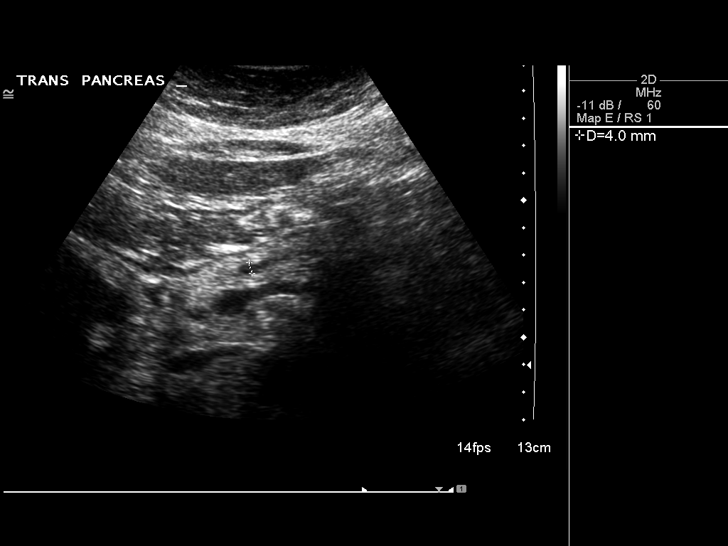
[im 55/83]
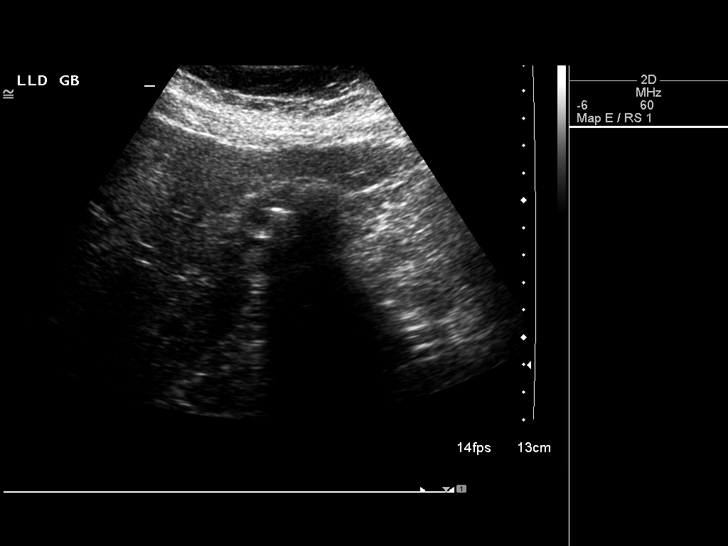
[im 62/83]
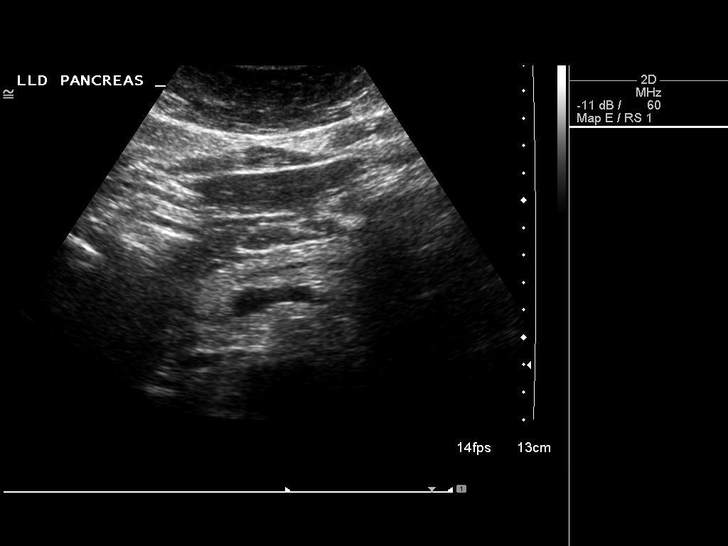
[im 69/83]
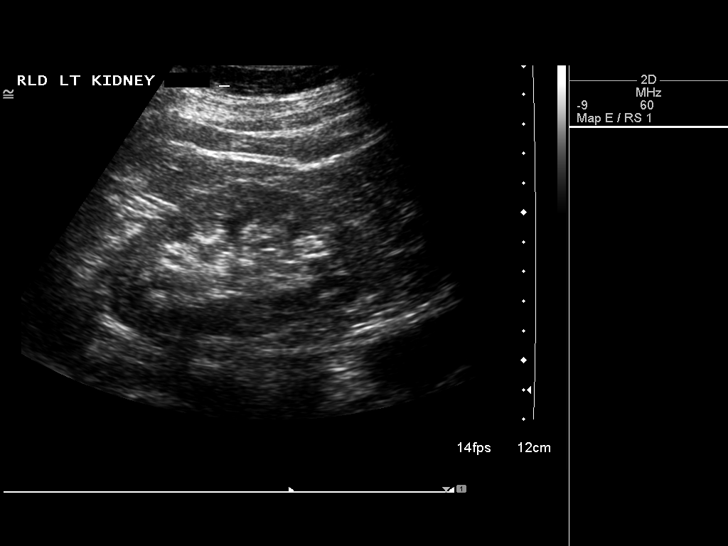
[im 76/83]
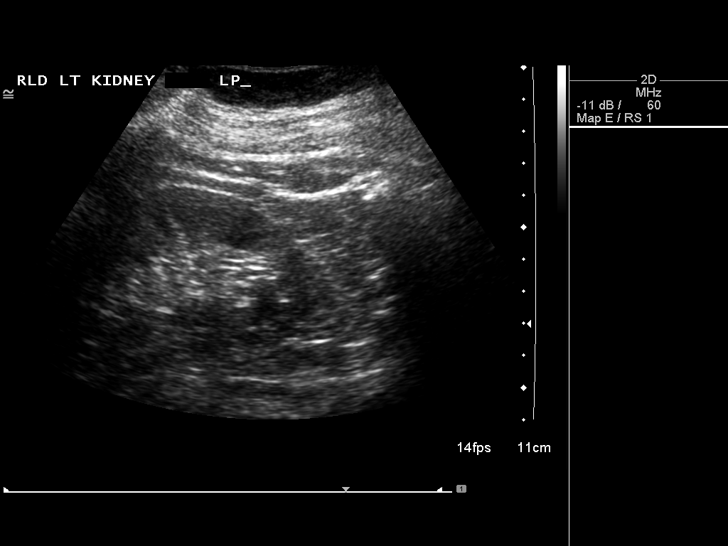
[im 83/83]
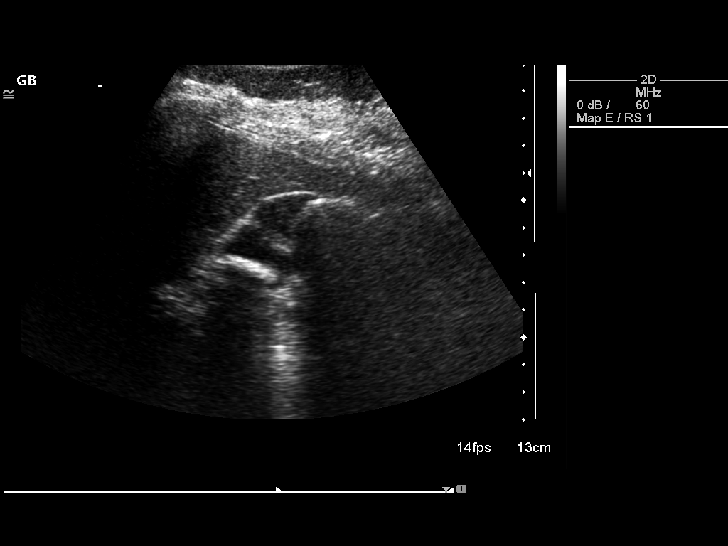

[13 of 25 positions shown; findings below may reference images not displayed]

FINDINGS: Gallbladder:  There is one nonmobile gallstone identified in the
region of the gallbladder neck measuring 2.3 cm in length and a
second nonmobile stone identified in the gallbladder fundus
measuring 2.7 cm in length.  Several smaller mobile gallstones are
also apparent.  On transverse views there is question of a
thickened fold or septation within the gallbladder, but this area
cannot be well assessed on sagittal views due to the presence of
the gallstones.  No associated gallbladder wall thickening or
pericholecystic fluid is seen and evaluation for a sonographic
Murphy's sign was negative.

Common bile duct:  Measures 4.3 mm in diameter and has a normal
appearance

Liver:  No focal lesion identified.  Within normal limits in
parenchymal echogenicity. No signs of intrahepatic duct dilatation
are noted

IVC:  The proximal portion appears normal

Pancreas:  Evaluation of the pancreatic tail is limited by
shadowing from overlying gas.  The head and body appear normal in
size and echotexture.  The pancreatic duct is visible measuring 3-4
mm in diameter and above the normal upper limits of 2mm.  No
explanation for the ductal dilatation is seen.

Spleen:  Has a sagittal length of 7.4 cm.  No focal parenchymal
abnormalities are identified.

Right Kidney:  Has a sagittal length of 9.6 cm.  No focal
parenchymal abnormality or signs of hydronephrosis are evident.

Left Kidney:  Demonstrates a sagittal length of 9.5 cm.  No focal
parenchymal abnormality or signs of hydronephrosis are evident

Abdominal aorta:  Has a maximal caliber of 2.7 cm with no
aneurysmal dilatation seen
IMPRESSION: Cholelithiasis with two non mobile gallstones with sizes as noted
above.  Question of a thickened fold versus septation.  This is
incompletely evaluated due to shadowing from the gallstones.  No
signs of associated cholecystitis.

Prominent pancreatic duct with no associated pancreatic mass or
other explanation sonographically.  This can be further evaluated
with MRCP to exclude a sonographically occult source of ductal
narrowing in the pancreatic head.

## 2013-03-08 ENCOUNTER — Ambulatory Visit: Payer: Self-pay

## 2013-03-11 ENCOUNTER — Ambulatory Visit: Payer: No Typology Code available for payment source | Attending: Family Medicine | Admitting: Family Medicine

## 2013-03-11 VITALS — BP 137/93 | HR 76 | Temp 98.7°F | Resp 18 | Ht 65.0 in | Wt 213.0 lb

## 2013-03-11 DIAGNOSIS — E785 Hyperlipidemia, unspecified: Secondary | ICD-10-CM

## 2013-03-11 DIAGNOSIS — R739 Hyperglycemia, unspecified: Secondary | ICD-10-CM

## 2013-03-11 DIAGNOSIS — I5022 Chronic systolic (congestive) heart failure: Secondary | ICD-10-CM

## 2013-03-11 DIAGNOSIS — R7309 Other abnormal glucose: Secondary | ICD-10-CM

## 2013-03-11 DIAGNOSIS — R0602 Shortness of breath: Secondary | ICD-10-CM

## 2013-03-11 DIAGNOSIS — E119 Type 2 diabetes mellitus without complications: Secondary | ICD-10-CM | POA: Insufficient documentation

## 2013-03-11 DIAGNOSIS — I5023 Acute on chronic systolic (congestive) heart failure: Secondary | ICD-10-CM

## 2013-03-11 DIAGNOSIS — I10 Essential (primary) hypertension: Secondary | ICD-10-CM

## 2013-03-11 DIAGNOSIS — I2510 Atherosclerotic heart disease of native coronary artery without angina pectoris: Secondary | ICD-10-CM

## 2013-03-11 DIAGNOSIS — N181 Chronic kidney disease, stage 1: Secondary | ICD-10-CM

## 2013-03-11 LAB — CBC
HCT: 45.9 % (ref 36.0–46.0)
Hemoglobin: 15.4 g/dL — ABNORMAL HIGH (ref 12.0–15.0)
RBC: 5.41 MIL/uL — ABNORMAL HIGH (ref 3.87–5.11)
RDW: 14.4 % (ref 11.5–15.5)
WBC: 4.1 10*3/uL (ref 4.0–10.5)

## 2013-03-11 LAB — POCT GLYCOSYLATED HEMOGLOBIN (HGB A1C): Hemoglobin A1C: 14

## 2013-03-11 MED ORDER — INSULIN NPH ISOPHANE & REGULAR (70-30) 100 UNIT/ML ~~LOC~~ SUSP: 12.0000 [IU] | Freq: Two times a day (BID) | SUBCUTANEOUS | Status: DC

## 2013-03-11 MED ORDER — CARVEDILOL 6.25 MG PO TABS: 6.2500 mg | ORAL_TABLET | Freq: Two times a day (BID) | ORAL | Status: DC

## 2013-03-11 MED ORDER — "INSULIN SYRINGE 31G X 5/16"" 0.3 ML MISC": 1.0000 | Status: DC

## 2013-03-11 MED ORDER — SPIRONOLACTONE 25 MG PO TABS: 25.0000 mg | ORAL_TABLET | Freq: Every day | ORAL | Status: DC

## 2013-03-11 MED ORDER — QUINAPRIL HCL 10 MG PO TABS: 10.0000 mg | ORAL_TABLET | Freq: Every day | ORAL | Status: DC

## 2013-03-11 MED ORDER — HYDRALAZINE HCL 25 MG PO TABS: 25.0000 mg | ORAL_TABLET | Freq: Three times a day (TID) | ORAL | Status: DC

## 2013-03-11 MED ORDER — INSULIN ASPART 100 UNIT/ML ~~LOC~~ SOLN
8.0000 [IU] | Freq: Once | SUBCUTANEOUS | Status: AC
  Administered 2013-03-11: 8 [IU] via SUBCUTANEOUS

## 2013-03-11 NOTE — Progress Notes (Signed)
Patient ID: Brittany Evans, female   DOB: 1955-10-12, 57 y.o.   MRN: [id number]  CC:  Need refills of medicaitons   HPI: Pt has not been seen by a doctor in about 2 years. She has CAD, CHF, DM and reporting weight loss about 15 pounds without trying to lose weight.  She is reporting that she would like to get back on her medicaitons.  She is reporting that she has no medical insurance but has family that can help her get prescriptions.  The patient has a complex cardiac history.  She has coronary artery disease and congestive heart failure.  She has not followed up with her cardiologist in nearly 2 years.  She reports that she is not taking all of the cardiac medications prescribed but is taking some.  She denies having chest pain and shortness of breath at this time.  She reports that she has lost significant amount of weight as mentioned above.   No Known Allergies Past Medical History  Diagnosis Date  . Systolic CHF   . Diabetes mellitus   . Hypertension   . Asthma   . NICM (nonischemic cardiomyopathy)     EF 20-25%  . CAD (coronary artery disease)     NSTEMI 2/12: LAD 30%, D1 40%, oCFX 70-80%, mRCA 10-20%, ?occl. of apical LAD; treated medically  . Hyperlipidemia   . Obesity    Current Outpatient Prescriptions on File Prior to Visit  Medication Sig Dispense Refill  . albuterol (PROAIR HFA) 108 (90 BASE) MCG/ACT inhaler Inhale 2 puffs into the lungs every 6 (six) hours as needed. Shortness of breath      . aspirin 325 MG tablet Take 325 mg by mouth daily.        . Multiple Vitamin (MULTIVITAMIN) tablet Take 1 tablet by mouth daily. One-A-Day MV       . nitroGLYCERIN (NITROSTAT) 0.4 MG SL tablet Place 0.4 mg under the tongue every 5 (five) minutes as needed. Chest pain      . rosuvastatin (CRESTOR) 40 MG tablet Take 1 tablet (40 mg total) by mouth daily.  30 tablet  4   No current facility-administered medications on file prior to visit.   Family History  Problem Relation Age of  Onset  . Cancer Mother   . Diabetes Mother   . Coronary artery disease Mother   . Heart disease Mother   . Cirrhosis Father     Alcohol abuse  . Alcohol abuse Father   . Heart disease Father    History   Social History  . Marital Status: Single    Spouse Name: N/A    Number of Children: N/A  . Years of Education: N/A   Occupational History  . [employer]     Part Time   Social History Main Topics  . Smoking status: Never Smoker   . Smokeless tobacco: Not on file  . Alcohol Use: No  . Drug Use: No  . Sexually Active: Not Currently   Other Topics Concern  . Not on file   Social History Narrative   Tobacco use - NO          Review of Systems  Constitutional: Negative for fever, chills, diaphoresis, activity change, appetite change and fatigue.  weight loss. HENT: Negative for ear pain, nosebleeds, congestion, facial swelling, rhinorrhea, neck pain, neck stiffness and ear discharge.   Eyes: Negative for pain, discharge, redness, itching and positive for visual disturbance.  Respiratory: Negative for cough, choking,  chest tightness, shortness of breath, wheezing and stridor.   Cardiovascular: Negative for chest pain, palpitations and leg swelling.  Gastrointestinal: Negative for abdominal distention.  Genitourinary: Negative for dysuria, urgency, frequency, hematuria, flank pain, decreased urine volume, difficulty urinating and dyspareunia.  Musculoskeletal: Negative for back pain, joint swelling, arthralgias and gait problem.  Neurological: Negative for dizziness, tremors, seizures, syncope, facial asymmetry, speech difficulty, weakness, light-headedness, numbness and headaches.  Hematological: Negative for adenopathy. Does not bruise/bleed easily.  Psychiatric/Behavioral: Negative for hallucinations, behavioral problems, confusion, dysphoric mood, decreased concentration and agitation.    Objective:   Filed Vitals:   03/11/13 1620  BP: 137/93  Pulse: 76   Temp: 98.7 F (37.1 C)  Resp: 18    Physical Exam  Constitutional: Appears well-developed and well-nourished. No distress.  overweight female. HENT: Normocephalic. External right and left ear normal. Oropharynx is clear and moist.  Eyes: Conjunctivae and EOM are normal. PERRLA, no scleral icterus.  Neck: Normal ROM. Neck supple. No JVD. No tracheal deviation. No thyromegaly.  CVS: RRR, S1/S2 +, no murmurs, no gallops, no carotid bruit.  Pulmonary: Effort and breath sounds normal, no stridor, rhonchi, wheezes, rales.  Abdominal: Soft. BS +,  no distension, tenderness, rebound or guarding.  Musculoskeletal: Normal range of motion. No edema and no tenderness.  Lymphadenopathy: No lymphadenopathy noted, cervical, inguinal. Neuro: Alert. Normal reflexes, muscle tone coordination. No cranial nerve deficit. Skin: Skin is warm and dry. No rash noted. Not diaphoretic. No erythema. No pallor.  Psychiatric: Normal mood and affect. Behavior, judgment, thought content normal.   Lab Results  Component Value Date   WBC 5.5 05/02/2011   HGB 12.1 05/02/2011   HCT 34.4* 05/02/2011   MCV 86.9 05/02/2011   PLT 217 05/02/2011   Lab Results  Component Value Date   CREATININE 1.04 05/04/2011   BUN 19 05/04/2011   NA 144 05/04/2011   K 3.6 05/04/2011   CL 108 05/04/2011   CO2 28 05/04/2011    Lab Results  Component Value Date   HGBA1C 14.0% 03/11/2013   Lipid Panel     Component Value Date/Time   CHOL  Value: 212        ATP III CLASSIFICATION:  <200     mg/dL   Desirable  098-119  mg/dL   Borderline High  >=147    mg/dL   High       * 04/15/5620 0530   TRIG 96 10/01/2010 0530   HDL 63 10/01/2010 0530   CHOLHDL 3.4 10/01/2010 0530   VLDL 19 10/01/2010 0530   LDLCALC  Value: 130        Total Cholesterol/HDL:CHD Risk Coronary Heart Disease Risk Table                     Men   Women  1/2 Average Risk   3.4   3.3  Average Risk       5.0   4.4  2 X Average Risk   9.6   7.1  3 X Average Risk  23.4   11.0         Use the calculated Patient Ratio above and the CHD Risk Table to determine the patient's CHD Risk.        ATP III CLASSIFICATION (LDL):  <100     mg/dL   Optimal  308-657  mg/dL   Near or Above  Optimal  130-159  mg/dL   Borderline  981-191  mg/dL   High  >478     mg/dL   Very High* 2/95/6213 0530       Assessment and plan:   Patient Active Problem List   Diagnosis Date Noted  . Dyslipidemia 03/11/2013  . Hyperglycemia 03/11/2013  . Chronic renal insufficiency, stage I 11/25/2010  . Renal insufficiency 11/15/2010  . SYSTOLIC HEART FAILURE, CHRONIC 10/21/2010  . CAD, NATIVE VESSEL 10/18/2010  . SYSTOLIC HEART FAILURE, ACUTE ON CHRONIC 10/18/2010  . DYSPNEA 10/18/2010  . TRANSAMINASES, SERUM, ELEVATED 12/20/2007  . DM, UNCOMPLICATED, TYPE II 02/12/2007  . HYPERLIPIDEMIA 02/08/2007  . HYPERTENSION 01/18/2007   Results for orders placed in visit on 03/11/13  POCT GLYCOSYLATED HEMOGLOBIN (HGB A1C)      Result Value Range   Hemoglobin A1C 14.0%     The patient has poorly controlled diabetes mellitus as evidenced by hemoglobin A1c of 14.0%.  The patient is at high-risk for acute and chronic complications of poorly controlled diabetes mellitus.  I explained that to the patient today and the patient verbalized understanding.  Start insulin 70/30 - take 12 units BID with meals -the patient was given instructions on how to use insulin in a bottle and syringe by the nursing staff.  Monitor BS closely-patient advised to test blood glucose 4 times per day and write numbers down.  Hypoglycemia precautions discussed  Refilled her medications for hypertension, discontinued oral diabetic medication trajuenta   Referral for diabetes education  Patient was given 8 units of Humalog in the office today to treat her blood sugar of greater than 300  RTC in 1 week  The patient was given clear instructions to go to ER or return to medical center if symptoms don't improve, worsen or  new problems develop.  The patient verbalized understanding.  The patient was told to call to get any lab results if not heard anything in the next week.    Rodney Langton, MD, CDE, FAAFP [facility] [place], [place]

## 2013-03-11 NOTE — Patient Instructions (Addendum)
Hypertension As your heart beats, it forces blood through your arteries. This force is your blood pressure. If the pressure is too high, it is called hypertension (HTN) or high blood pressure. HTN is dangerous because you may have it and not know it. High blood pressure may mean that your heart has to work harder to pump blood. Your arteries may be narrow or stiff. The extra work puts you at risk for heart disease, stroke, and other problems.  Blood pressure consists of two numbers, a higher number over a lower, 110/72, for example. It is stated as "110 over 72." The ideal is below 120 for the top number (systolic) and under 80 for the bottom (diastolic). Write down your blood pressure today. You should pay close attention to your blood pressure if you have certain conditions such as:  Heart failure.  Prior heart attack.  Diabetes  Chronic kidney disease.  Prior stroke.  Multiple risk factors for heart disease. To see if you have HTN, your blood pressure should be measured while you are seated with your arm held at the level of the heart. It should be measured at least twice. A one-time elevated blood pressure reading (especially in the Emergency Department) does not mean that you need treatment. There may be conditions in which the blood pressure is different between your right and left arms. It is important to see your caregiver soon for a recheck. Most people have essential hypertension which means that there is not a specific cause. This type of high blood pressure may be lowered by changing lifestyle factors such as:  Stress.  Smoking.  Lack of exercise.  Excessive weight.  Drug/tobacco/alcohol use.  Eating less salt. Most people do not have symptoms from high blood pressure until it has caused damage to the body. Effective treatment can often prevent, delay or reduce that damage. TREATMENT  When a cause has been identified, treatment for high blood pressure is directed at the  cause. There are a large number of medications to treat HTN. These fall into several categories, and your caregiver will help you select the medicines that are best for you. Medications may have side effects. You should review side effects with your caregiver. If your blood pressure stays high after you have made lifestyle changes or started on medicines,   Your medication(s) may need to be changed.  Other problems may need to be addressed.  Be certain you understand your prescriptions, and know how and when to take your medicine.  Be sure to follow up with your caregiver within the time frame advised (usually within two weeks) to have your blood pressure rechecked and to review your medications.  If you are taking more than one medicine to lower your blood pressure, make sure you know how and at what times they should be taken. Taking two medicines at the same time can result in blood pressure that is too low. SEEK IMMEDIATE MEDICAL CARE IF:  You develop a severe headache, blurred or changing vision, or confusion.  You have unusual weakness or numbness, or a faint feeling.  You have severe chest or abdominal pain, vomiting, or breathing problems. MAKE SURE YOU:   Understand these instructions.  Will watch your condition.  Will get help right away if you are not doing well or get worse. Document Released: 08/04/2005 Document Revised: 10/27/2011 Document Reviewed: 03/24/2008 [facility] Patient Information 2014 [place]. Chronic Kidney Disease Chronic kidney disease occurs when the kidneys are damaged over a long period.  The kidneys are two organs that lie on either side of the spine between the middle of the back and the front of the abdomen. The kidneys:   Remove wastes and extra water from the blood.   Produce important hormones. These help keep bones strong, regulate blood pressure, and help create red blood cells.   Balance the fluids and chemicals in the blood and  tissues. A small amount of kidney damage may not cause problems, but a large amount of damage may make it difficult or impossible for the kidneys to work the way they should. If steps are not taken to slow down the kidney damage or stop it from getting worse, the kidneys may stop working permanently. Most of the time, chronic kidney disease does not go away. However, it can often be controlled, and those with the disease can usually live normal lives. CAUSES  The most common causes of chronic kidney disease are diabetes and high blood pressure (hypertension). Chronic kidney disease may also be caused by:   Diseases that cause kidneys' filters to become inflamed.   Diseases that affect the immune system.   Genetic diseases.   Medicines that damage the kidneys, such as anti-inflammatory medicines.  Poisoning or exposure to toxic substances.   A reoccurring kidney or urinary infection.   A problem with urine flow. This may be caused by:   Cancer.   Kidney stones.   An enlarged prostate in males. SYMPTOMS  Because the kidney damage in chronic kidney disease occurs slowly, symptoms develop slowly and may not be obvious until the kidney damage becomes severe. A person may have a kidney disease for years without showing any symptoms. Symptoms can include:   Swelling (edema) of the legs, ankles, or feet.   Tiredness (lethargy).   Nausea or vomiting.   Confusion.   Problems with urination, such as:   Decreased urine production.   Frequent urination, especially at night.   Frequent accidents in children who are potty trained.   Muscle twitches and cramps.   Shortness of breath.  Weakness.   Persistent itchiness.   Loss of appetite.  Metallic taste in the mouth.  Trouble sleeping.  Slowed development in children.  Short stature in children. DIAGNOSIS  Chronic kidney disease may be detected and diagnosed by tests, including blood, urine, imaging,  or kidney biopsy tests.  TREATMENT  Most chronic kidney diseases cannot be cured. Treatment usually involves relieving symptoms and preventing or slowing the progression of the disease. Treatment may include:   A special diet. You may need to avoid alcohol and foods thatare salty and high in potassium.   Medicines. These may:   Lower blood pressure.   Relieve anemia.   Relieve swelling.   Protect the bones. HOME CARE INSTRUCTIONS   Follow your prescribed diet.   Only take over-the-counter or prescription medicines as directed by your caregiver.  Do not take any new medicines (prescription, over-the-counter, or nutritional supplements) unless approved by your caregiver. Many medicines can worsen your kidney damage or need to have the dose adjusted.   Quit smoking if you are a smoker. Talk to your caregiver about a smoking cessation program.   Keep all follow-up appointments as directed by your caregiver. SEEK IMMEDIATE MEDICAL CARE IF:  Your symptoms get worse or you develop new symptoms.   You develop symptoms of end-stage kidney disease. These include:   Headaches.   Abnormally dark or light skin.   Numbness in the hands or feet.  Easy bruising.   Frequent hiccups.   Menstruation stops.   You have a fever.   You have decreased urine production.   You havepain or bleeding when urinating. MAKE SURE YOU:  Understand these instructions.  Will watch your condition.  Will get help right away if you are not doing well or get worse. FOR MORE INFORMATION  American Association of Kidney Patients: ResidentialShow.is National Kidney Foundation: www.kidney.org American Kidney Fund: FightingMatch[employer].ee [facility]: www.lifeoptions.org and www.kidneyschool.org Document Released: 05/13/2008 Document Revised: 07/21/2012 Document Reviewed: 04/02/2012 [facility] Patient Information 2014 [place], [place]. Blood Sugar Monitoring,  Adult GLUCOSE METERS FOR SELF-MONITORING OF BLOOD GLUCOSE  It is important to be able to correctly measure your blood sugar (glucose). You can use a blood glucose monitor (a small battery-operated device) to check your glucose level at any time. This allows you and your caregiver to monitor your diabetes and to determine how well your treatment plan is working. The process of monitoring your blood glucose with a glucose meter is called self-monitoring of blood glucose (SMBG). When people with diabetes control their blood sugar, they have better health. To test for glucose with a typical glucose meter, place the disposable strip in the meter. Then place a small sample of blood on the "test strip." The test strip is coated with chemicals that combine with glucose in blood. The meter measures how much glucose is present. The meter displays the glucose level as a number. Several new models can record and store a number of test results. Some models can connect to personal computers to store test results or print them out.  Newer meters are often easier to use than older models. Some meters allow you to get blood from places other than your fingertip. Some new models have automatic timing, error codes, signals, or barcode readers to help with proper adjustment (calibration). Some meters have a large display screen or spoken instructions for people with visual impairments.  INSTRUCTIONS FOR USING GLUCOSE METERS  Wash your hands with soap and warm water, or clean the area with alcohol. Dry your hands completely.  Prick the side of your fingertip with a lancet (a sharp-pointed tool used by hand).  Hold the hand down and gently milk the finger until a small drop of blood appears. Catch the blood with the test strip.  Follow the instructions for inserting the test strip and using the SMBG meter. Most meters require the meter to be turned on and the test strip to be inserted before applying the blood  sample.  Record the test result.  Read the instructions carefully for both the meter and the test strips that go with it. Meter instructions are found in the user manual. Keep this manual to help you solve any problems that may arise. Many meters use "error codes" when there is a problem with the meter, the test strip, or the blood sample on the strip. You will need the manual to understand these error codes and fix the problem.  New devices are available such as laser lancets and meters that can test blood taken from "alternative sites" of the body, other than fingertips. However, you should use standard fingertip testing if your glucose changes rapidly. Also, use standard testing if:  You have eaten, exercised, or taken insulin in the past 2 hours.  You think your glucose is low.  You tend to not feel symptoms of low blood glucose (hypoglycemia).  You are ill or under stress.  Clean the meter as  directed by the manufacturer.  Test the meter for accuracy as directed by the manufacturer.  Take your meter with you to your caregiver's office. This way, you can test your glucose in front of your caregiver to make sure you are using the meter correctly. Your caregiver can also take a sample of blood to test using a routine lab method. If values on the glucose meter are close to the lab results, you and your caregiver will see that your meter is working well and you are using good technique. Your caregiver will advise you about what to do if the results do not match. FREQUENCY OF TESTING  Your caregiver will tell you how often you should check your blood glucose. This will depend on your type of diabetes, your current level of diabetes control, and your types of medicines. The following are general guidelines, but your care plan may be different. Record all your readings and the time of day you took them for review with your caregiver.   Diabetes type 1.  When you are using insulin with good  diabetic control (either multiple daily injections or via a pump), you should check your glucose 4 times a day.  If your diabetes is not well controlled, you may need to monitor more frequently, including before meals and 2 hours after meals, at bedtime, and occasionally between 2 a.m. and 3 a.m.  You should always check your glucose before a dose of insulin or before changing the rate on your insulin pump.  Diabetes type 2.  Guidelines for SMBG in diabetes type 2 are not as well defined.  If you are on insulin, follow the guidelines above.  If you are on medicines, but not insulin, and your glucose is not well controlled, you should test at least twice daily.  If you are not on insulin, and your diabetes is controlled with medicines or diet alone, you should test at least once daily, usually before breakfast.  A weekly profile will help your caregiver advise you on your care plan. The week before your visit, check your glucose before a meal and 2 hours after a meal at least daily. You may want to test before and after a different meal each day so you and your caregiver can tell how well controlled your blood sugars are throughout the course of a 24 hour period.  Gestational diabetes (diabetes during pregnancy).  Frequent testing is often necessary. Accurate timing is important.  If you are not on insulin, check your glucose 4 times a day. Check it before breakfast and 1 hour after the start of each meal.  If you are on insulin, check your glucose 6 times a day. Check it before each meal and 1 hour after the first bite of each meal.  General guidelines.  More frequent testing is required at the start of insulin treatment. Your caregiver will instruct you.  Test your glucose any time you suspect you have low blood sugar (hypoglycemia).  You should test more often when you change medicines, when you have unusual stress or illness, or in other unusual circumstances. OTHER THINGS TO KNOW  ABOUT GLUCOSE METERS  Measurement Range. Most glucose meters are able to read glucose levels over a broad range of values from as low as 0 to as high as 600 mg/dL. If you get an extremely high or low reading from your meter, you should first confirm it with another reading. Report very high or very low readings to your caregiver.  Whole Blood Glucose versus Plasma Glucose. Some older home glucose meters measure glucose in your whole blood. In a lab or when using some newer home glucose meters, the glucose is measured in your plasma (one component of blood). The difference can be important. It is important for you and your caregiver to know whether your meter gives its results as "whole blood equivalent" or "plasma equivalent."  Display of High and Low Glucose Values. Part of learning how to operate a meter is understanding what the meter results mean. Know how high and low glucose concentrations are displayed on your meter.  Factors that Affect Glucose Meter Performance. The accuracy of your test results depends on many factors and varies depending on the brand and type of meter. These factors include:  Low red blood cell count (anemia).  Substances in your blood (such as uric acid, vitamin C, and others).  Environmental factors (temperature, humidity, altitude).  Name-brand versus generic test strips.  Calibration. Make sure your meter is set up properly. It is a good idea to do a calibration test with a control solution recommended by the manufacturer of your meter whenever you begin using a fresh bottle of test strips. This will help verify the accuracy of your meter.  Improperly stored, expired, or defective test strips. Keep your strips in a dry place with the lid on.  Soiled meter.  Inadequate blood sample. NEW TECHNOLOGIES FOR GLUCOSE TESTING Alternative site testing Some glucose meters allow testing blood from alternative sites. These include the:  Upper arm.  Forearm.  Base  of the thumb.  Thigh. Sampling blood from alternative sites may be desirable. However, it may have some limitations. Blood in the fingertips show changes in glucose levels more quickly than blood in other parts of the body. This means that alternative site test results may be different from fingertip test results, not because of the meter's ability to test accurately, but because the actual glucose concentration can be different.  Continuous Glucose Monitoring Devices to measure your blood glucose continuously are available, and others are in development. These methods can be more expensive than self-monitoring with a glucose meter. However, it is uncertain how effective and reliable these devices are. Your caregiver will advise you if this approach makes sense for you. IF BLOOD SUGARS ARE CONTROLLED, PEOPLE WITH DIABETES REMAIN HEALTHIER.  SMBG is an important part of the treatment plan of patients with diabetes mellitus. Below are reasons for using SMBG:   It confirms that your glucose is at a specific, healthy level.  It detects hypoglycemia and severe hyperglycemia.  It allows you and your caregiver to make adjustments in response to changes in lifestyle for individuals requiring medicine.  It determines the need for starting insulin therapy in temporary diabetes that happens during pregnancy (gestational diabetes). Document Released: 08/07/2003 Document Revised: 10/27/2011 Document Reviewed: 11/28/2010 [facility] Patient Information 2014 [place], [place].  Heart Failure Heart failure is a condition in which the heart has trouble pumping blood. This means your heart does not pump blood efficiently for your body to work well. In some cases of heart failure, fluid may back up into your lungs or you may have swelling (edema) in your lower legs. Heart failure is a long-term (chronic) condition. It is important for you to take good care of yourself and follow your caregiver's treatment plan. CAUSES    Health conditions:  High blood pressure (hypertension) causes the heart muscle to work harder than normal. When pressure in the blood vessels is  high, the heart needs to pump (contract) with more force in order to circulate blood throughout the body. High blood pressure eventually causes the heart to become stiff and weak.  Coronary artery disease (CAD) is the buildup of cholesterol and fat (plaque) in the arteries of the heart. The blockage in the arteries deprives the heart muscle of oxygen and blood. This can cause chest pain and may lead to a heart attack. High blood pressure can also contribute to CAD.  Heart attack (myocardial infarction) occurs when 1 or more arteries in the heart become blocked. The loss of oxygen damages the muscle tissue of the heart. When this happens, part of the heart muscle dies. The injured tissue does not contract as well and weakens the heart's ability to pump blood.  Abnormal heart valves can cause heart failure when the heart valves do not open and close properly. This makes the heart muscle pump harder to keep the blood flowing.  Heart muscle disease (cardiomyopathy or myocarditis) is damage to the heart muscle from a variety of causes. These can include drug or alcohol abuse, infections, or unknown reasons. These can increase the risk of heart failure.  Lung disease makes the heart work harder because the lungs do not work properly. This can cause a strain on the heart, leading it to fail.  Diabetes increases the risk of heart failure. High blood sugar contributes to high fat (lipid) levels in the blood. Diabetes can also cause slow damage to tiny blood vessels that carry important nutrients to the heart muscle. When the heart does not get enough oxygen and food, it can cause the heart to become weak and stiff. This leads to a heart that does not contract efficiently.  Other diseases can contribute to heart failure. These include abnormal heart rhythms,  thyroid problems, and low blood counts (anemia).  Unhealthy behaviors:  Being overweight.  Smoking or chewing tobacco.  Eating foods high in fat and cholesterol.  Eating foods or drinking beverages high in salt.  Abusing drugs or alcohol.  Lacking physical activity. SYMPTOMS  Heart failure symptoms may vary and can be hard to detect. Symptoms may include:  Shortness of breath with activity, such as climbing stairs.  Persistent cough.  Swelling of the feet, ankles, legs, or abdomen.  Unexplained, sudden weight gain.  Difficulty breathing when lying flat (orthopnea).  Waking from sleep because of the need to sit up and get more air.  Rapid heartbeat.  Fatigue and loss of energy.  Feeling lightheaded, dizzy, or close to fainting.  Loss of appetite.  Nausea.  Increased urination during the night (nocturia). DIAGNOSIS  A diagnosis of heart failure is based on your history, symptoms, physical examination, and diagnostic tests. Diagnostic tests for heart failure may include:  Electrocardiography.  Chest X-ray.  Blood tests.  Exercise stress test.  Echocardiography.  Cardiac angiography.  Radionuclide scans. TREATMENT  Treatment is aimed at managing the symptoms of heart failure. Medicines, behavioral changes, or surgical intervention may be necessary to treat heart failure.  Medicines to help treat heart failure may include:  Angiotensin-converting enzyme (ACE) inhibitors. This type of medicine blocks the effects of a blood protein called angiotensin-converting enzyme. ACE inhibitors relax (dilate) the blood vessels and help lower blood pressure.  Angiotensin receptor blockers. This type of medicine blocks the actions of a blood protein called angiotensin. Angiotensin receptor blockers dilate the blood vessels and help lower blood pressure.  Aldosterone antagonists. This type of medicine helps rid your body of  extra fluid. This loss of extra fluid lowers  the volume of blood the heart pumps.  Water pills (diuretics). Diuretics cause the kidneys to remove salt and water from the blood. The extra fluid is removed through urination.  Beta blockers. These prevent the heart from beating too fast and improve heart muscle strength.  Digitalis. This increases the force of the heartbeat.  Healthy behavior changes include:  Obtaining and maintaining a healthy weight.  Stopping smoking or chewing tobacco.  Eating heart healthy foods.  Limiting or avoiding alcohol.  Stopping drug use.  Becoming as physically active as possible.  Surgical treatment for heart failure may include:  A procedure to open blocked arteries, repair damaged heart valves, or remove damaged heart muscle tissue.  A pacemaker to improve heart muscle function and control certain abnormal heart rhythms.  An internal cardioverter defibrillator to possibly prevent sudden cardiac death.  A left ventricular assist device to assist the pumping ability of the heart. HOME CARE INSTRUCTIONS   Take your medicine as directed by your caregiver. Medicines are important in reducing the workload of your heart, slowing the progression of heart failure, and improving your symptoms.  Do not stop taking your medicine unless directed by your caregiver.  Do not skip any dose of medicine.  Refill your prescriptions before you run out of medicine. Your medicines are needed every day.  Take over-the-counter medicine only as directed by your caregiver or pharmacist.  Stay physically active. Your caregiver or cardiac rehabilitation program can help determine your level of physical activity. It is important to follow your specific physical activity program. Regular physical activity can control weight and improve your energy, endurance, health, cholesterol levels, mood, and nighttime sleep. It is important to pace your physical activity to avoid shortness of breath or chest pain. It is  recommended to rest for 1 hour before and after meals.  Eat heart healthy foods. Food choices should be low in saturated fat, trans fat, cholesterol, and salt (sodium). Healthy choices include fresh or frozen fruits and vegetables, fish, lean meats, legumes, fat-free or low-fat dairy products, and whole grain or high fiber foods. Limit your sodium to 1500 milligrams (mg) each day or as recommended by your caregiver. Talk to a dietitian to learn more about heart healthy foods and healthy seasonings.  Use healthy cooking methods. Healthy cooking methods include roasting, grilling, broiling, baking, poaching, steaming, or stir-frying. Talk to a dietitian to learn more about healthy cooking methods.  Limit fluids if directed by your caregiver. Fluid restriction may reduce symptoms of heart failure in some individuals.  Weigh yourself every day. Daily weights are important in the early recognition of excess fluid. You should weigh yourself every morning after you urinate and before you eat breakfast. Wear the same amount of clothing each time you weigh yourself. Record your daily weight. Provide your caregiver with your weight record.  Monitor and record your blood pressure if directed by your caregiver.  Check your pulse if directed by your caregiver.  If you are overweight, it is time to lose and maintain a healthy weight.  Stop smoking or chewing tobacco. Nicotine makes your heart work harder by causing your blood vessels to constrict. Do not use nicotine gum or patches before talking to your caregiver.  Schedule and attend follow-up visits as directed by your caregiver. It is important to keep all your appointments.  Limit alcohol intake to no more than 1 drink per day for nonpregnant women and 2 drinks per  day for men. Drinking more than that is harmful to your heart. Tell your caregiver if you drink alcohol several times a week. Talk with your caregiver about whether alcohol is safe for you. If  your heart has already been damaged by alcohol or you have severe heart failure, drinking alcohol should be stopped completely.  Stop illegal drug use.  Stay up-to-date with immunizations. It is especially important to prevent respiratory infections through current pneumococcal and influenza immunizations.  Manage other health conditions such as hypertension, diabetes, thyroid disease, or abnormal heart rhythms as directed by your caregiver.  Learn to manage stress.  Plan rest periods when fatigued.  Learn strategies to manage high temperatures. If the weather is extremely hot:  Avoid vigorous physical activity.  Use air conditioning or fans or seek a cooler location.  Avoid caffeine and alcohol.  Wear loose-fitting, lightweight, and light-colored clothing.  Learn strategies to manage cold temperatures. If the weather is extremely cold:  Avoid vigorous physical activity.  Layer clothes.  Wear mittens or gloves, a hat, and a scarf when going outside.  Avoid alcohol.  Obtain ongoing education and support as needed.  Participate or seek rehabilitation as needed to maintain or improve independence and quality of life. SEEK MEDICAL CARE IF:   Your weight increases by 3 lb/1.4 kg in 1 day or 5 lb/2.3 kg in a week.  You have increasing shortness of breath that is unusual for you.  You are unable to participate in your usual physical activities.  You tire easily.  You cough more than normal, especially with physical activity.  You have any or more swelling in areas such as your hands, feet, ankles, or abdomen.  You are unable to sleep because it is hard to breathe.  You cough up bloody mucus (sputum).  You feel like your heart is beating fast (palpitations).  You become dizzy or lightheaded upon standing up. SEEK IMMEDIATE MEDICAL CARE IF:   You have difficulty breathing.  There is a change in mental status such as decreased alertness or difficulty with  concentration.  You have a pain or discomfort in your chest.  You have an episode of fainting (syncope).  You are in an area of high temperatures and you have signs of heat exhaustion:  Heavy sweating.  Muscle cramps.  Weakness.  Dizziness.  Headaches.  Fainting.  You are in an area of cold temperatures and you have signs of hypothermia:  Clumsiness.  Confusion.  Sleepiness.  Shivering. MAKE SURE YOU:   Understand these instructions.  Will watch your condition.  Will get help right away if you are not doing well or get worse. Document Released: 08/04/2005 Document Revised: 07/21/2012 Document Reviewed: 03/04/2012 [facility] Patient Information 2014 [place], [place].

## 2013-03-11 NOTE — Progress Notes (Signed)
Patient presents for labs and needs refills of hypertension meds; patient states she needs refills of:  Carvedilol Hydralazine Linagliptin  Also patient states that she stopped taking Crestor due to pains in lower back and legs at night. States she started seeing blood when she wiped after urination as well. States pain has slowly ebbed since stopping the crestor, but has not gone completely away.

## 2013-03-12 LAB — COMPLETE METABOLIC PANEL WITH GFR
AST: 14 U/L (ref 0–37)
Albumin: 4.5 g/dL (ref 3.5–5.2)
BUN: 15 mg/dL (ref 6–23)
CO2: 27 mEq/L (ref 19–32)
Calcium: 10.7 mg/dL — ABNORMAL HIGH (ref 8.4–10.5)
Chloride: 98 mEq/L (ref 96–112)
GFR, Est African American: 49 mL/min — ABNORMAL LOW
Glucose, Bld: 369 mg/dL — ABNORMAL HIGH (ref 70–99)
Potassium: 4.5 mEq/L (ref 3.5–5.3)

## 2013-03-12 LAB — LIPID PANEL
Cholesterol: 275 mg/dL — ABNORMAL HIGH (ref 0–200)
LDL Cholesterol: 164 mg/dL — ABNORMAL HIGH (ref 0–99)
VLDL: 54 mg/dL — ABNORMAL HIGH (ref 0–40)

## 2013-03-12 LAB — VITAMIN D 25 HYDROXY (VIT D DEFICIENCY, FRACTURES): Vit D, 25-Hydroxy: 22 ng/mL — ABNORMAL LOW (ref 30–89)

## 2013-03-13 ENCOUNTER — Other Ambulatory Visit: Payer: Self-pay | Admitting: Family Medicine

## 2013-03-13 MED ORDER — ROSUVASTATIN CALCIUM 20 MG PO TABS: 20.0000 mg | ORAL_TABLET | ORAL | Status: DC

## 2013-03-13 NOTE — Progress Notes (Signed)
Quick Note:  Please inform patient that labs show renal insufficiency. Blood sugar is markedly elevated at 369. Also, cholesterol lipid levels are markedly elevated. A prescription for crestor was [facility] so that they can assist patient with the medication for cholesterol. Thyroid tests came back OK. Vitamin D level came back low. Recommend prescription Vit D 50,000 IU caps, take 1 po once per week, #8, no refills (please call in for patient). Labs need to be rechecked in 3 months time.   Rodney Langton, MD, CDE, FAAFP [facility] [place], [place]   ______

## 2013-03-14 ENCOUNTER — Telehealth: Payer: Self-pay | Admitting: *Deleted

## 2013-03-14 NOTE — Telephone Encounter (Signed)
03/14/13 Patient made aware of lab results . Blood sugar  Elevated 369. Cholesterol level elevated made aware  That Crestpr was faxed to [facility] program per Dr. Laural Benes. Vitamin D level came back low recommend  Prescription Vitamin D Which was called in to [facility] on [address]. P.Francine Hannan,RN BSN [facility]

## 2013-03-15 ENCOUNTER — Telehealth: Payer: Self-pay | Admitting: Family Medicine

## 2013-03-15 ENCOUNTER — Other Ambulatory Visit: Payer: Self-pay | Admitting: Family Medicine

## 2013-03-15 MED ORDER — CARVEDILOL 6.25 MG PO TABS: 6.2500 mg | ORAL_TABLET | Freq: Two times a day (BID) | ORAL | Status: DC

## 2013-03-15 NOTE — Telephone Encounter (Signed)
Electronic script for carvedilol (COREG) 6.25 MG tablet has two different dosages/directions.  Pharmacist would like to confirm which is correct.

## 2013-03-15 NOTE — Telephone Encounter (Signed)
I spoke with pharmacist to confirm prescriptions for carvedilol as should be 6.25 mg po BID   Rodney Langton, MD, CDE, FAAFP [facility] [place], [place]

## 2013-03-17 ENCOUNTER — Telehealth: Payer: Self-pay

## 2013-03-17 NOTE — Telephone Encounter (Signed)
Spoke with dawn with the [facility] program Called to clarify order for crestor Should be 20mg  1 tab every other day Verified by Dr Laural Benes

## 2013-03-18 ENCOUNTER — Encounter: Payer: Self-pay | Admitting: Family Medicine

## 2013-03-18 ENCOUNTER — Ambulatory Visit: Payer: No Typology Code available for payment source | Attending: Family Medicine | Admitting: Family Medicine

## 2013-03-18 VITALS — BP 113/74 | HR 62 | Temp 98.7°F | Resp 14 | Ht 65.0 in | Wt 218.0 lb

## 2013-03-18 DIAGNOSIS — E119 Type 2 diabetes mellitus without complications: Secondary | ICD-10-CM | POA: Insufficient documentation

## 2013-03-18 DIAGNOSIS — IMO0001 Reserved for inherently not codable concepts without codable children: Secondary | ICD-10-CM

## 2013-03-18 LAB — COMPLETE METABOLIC PANEL WITHOUT GFR
ALT: 18 U/L (ref 0–35)
AST: 16 U/L (ref 0–37)
Albumin: 4 g/dL (ref 3.5–5.2)
Alkaline Phosphatase: 68 U/L (ref 39–117)
BUN: 18 mg/dL (ref 6–23)
CO2: 26 meq/L (ref 19–32)
Calcium: 9.5 mg/dL (ref 8.4–10.5)
Chloride: 106 meq/L (ref 96–112)
Creat: 1.25 mg/dL — ABNORMAL HIGH (ref 0.50–1.10)
GFR, Est African American: 56 mL/min — ABNORMAL LOW
GFR, Est Non African American: 48 mL/min — ABNORMAL LOW
Glucose, Bld: 177 mg/dL — ABNORMAL HIGH (ref 70–99)
Potassium: 4.3 meq/L (ref 3.5–5.3)
Sodium: 139 meq/L (ref 135–145)
Total Bilirubin: 0.7 mg/dL (ref 0.3–1.2)
Total Protein: 6.6 g/dL (ref 6.0–8.3)

## 2013-03-18 MED ORDER — INSULIN NPH ISOPHANE & REGULAR (70-30) 100 UNIT/ML ~~LOC~~ SUSP: 15.0000 [IU] | Freq: Two times a day (BID) | SUBCUTANEOUS | Status: DC

## 2013-03-18 MED ORDER — KETOCONAZOLE 2 % EX CREA: TOPICAL_CREAM | Freq: Every day | CUTANEOUS | Status: DC

## 2013-03-18 NOTE — Progress Notes (Signed)
Patient ID: Brittany Evans, female   DOB: [birth year], 57 y.o.   MRN: [id number]  CC:  Chief Complaint  Patient presents with  . Follow-up  . Diabetes   HPI: Pt reports that she has had a rash in the folds of skin under knees and between thighs and reports that it has been pruritic. Pt says that her home blood sugars have been 255-300.  Pt says that she is taking the 12 units of 70/30 bid with meals.  No BS readings less than 100.  Pt says she is still having aching in muscles.  No Known Allergies Past Medical History  Diagnosis Date  . Systolic CHF   . Diabetes mellitus   . Hypertension   . Asthma   . NICM (nonischemic cardiomyopathy)     EF 20-25%  . CAD (coronary artery disease)     NSTEMI 2/12: LAD 30%, D1 40%, oCFX 70-80%, mRCA 10-20%, ?occl. of apical LAD; treated medically  . Hyperlipidemia   . Obesity    Current Outpatient Prescriptions on File Prior to Visit  Medication Sig Dispense Refill  . albuterol (PROAIR HFA) 108 (90 BASE) MCG/ACT inhaler Inhale 2 puffs into the lungs every 6 (six) hours as needed. Shortness of breath      . aspirin 325 MG tablet Take 325 mg by mouth daily.        . carvedilol (COREG) 6.25 MG tablet Take 1 tablet (6.25 mg total) by mouth 2 (two) times daily with a meal.  60 tablet  2  . hydrALAZINE (APRESOLINE) 25 MG tablet Take 1 tablet (25 mg total) by mouth 3 (three) times daily.  90 tablet  2  . Insulin Syringe-Needle U-100 (INSULIN SYRINGE .3CC/31GX5/16") 31G X 5/16" 0.3 ML MISC 1 Syringe by Does not apply route as directed.  100 each  5  . Multiple Vitamin (MULTIVITAMIN) tablet Take 1 tablet by mouth daily. One-A-Day MV       . nitroGLYCERIN (NITROSTAT) 0.4 MG SL tablet Place 0.4 mg under the tongue every 5 (five) minutes as needed. Chest pain      . quinapril (ACCUPRIL) 10 MG tablet Take 1 tablet (10 mg total) by mouth daily.  30 tablet  2  . rosuvastatin (CRESTOR) 20 MG tablet Take 1 tablet (20 mg total) by mouth every other day.  45 tablet  2   . spironolactone (ALDACTONE) 25 MG tablet Take 1 tablet (25 mg total) by mouth daily.  30 tablet  2   No current facility-administered medications on file prior to visit.   Family History  Problem Relation Age of Onset  . Cancer Mother   . Diabetes Mother   . Coronary artery disease Mother   . Heart disease Mother   . Cirrhosis Father     Alcohol abuse  . Alcohol abuse Father   . Heart disease Father    History   Social History  . Marital Status: Single    Spouse Name: N/A    Number of Children: N/A  . Years of Education: N/A   Occupational History  . [employer]     Part Time   Social History Main Topics  . Smoking status: Never Smoker   . Smokeless tobacco: Not on file  . Alcohol Use: No  . Drug Use: No  . Sexually Active: Not Currently   Other Topics Concern  . Not on file   Social History Narrative   Tobacco use - NO  Review of Systems  Constitutional: Negative for fever, chills, diaphoresis, activity change, appetite change and fatigue.  HENT: Negative for ear pain, nosebleeds, congestion, facial swelling, rhinorrhea, neck pain, neck stiffness and ear discharge.   Eyes: Negative for pain, discharge, redness, itching and visual disturbance.  Respiratory: Negative for cough, choking, chest tightness, shortness of breath, wheezing and stridor.   Cardiovascular: Negative for chest pain, palpitations and leg swelling.  Gastrointestinal: Negative for abdominal distention.  Genitourinary: Negative for dysuria, urgency, frequency, hematuria, flank pain, decreased urine volume, difficulty urinating and dyspareunia.  Musculoskeletal: Negative for back pain, joint swelling, arthralgias and gait problem.  Neurological: Negative for dizziness, tremors, seizures, syncope, facial asymmetry, speech difficulty, weakness, light-headedness, numbness and headaches.  Hematological: Negative for adenopathy. Does not bruise/bleed easily.  Psychiatric/Behavioral:  Negative for hallucinations, behavioral problems, confusion, dysphoric mood, decreased concentration and agitation.    Objective:   Filed Vitals:   03/18/13 1007  BP: 113/74  Pulse: 62  Temp: 98.7 F (37.1 C)  Resp: 14    Physical Exam  Constitutional: Appears well-developed and well-nourished. No distress.  HENT: Normocephalic. External right and left ear normal. Oropharynx is clear and moist.  Eyes: Conjunctivae and EOM are normal. PERRLA, no scleral icterus.  Neck: Normal ROM. Neck supple. No JVD. No tracheal deviation. No thyromegaly.  CVS: RRR, S1/S2 +, no murmurs, no gallops, no carotid bruit.  Pulmonary: Effort and breath sounds normal, no stridor, rhonchi, wheezes, rales.  Abdominal: Soft. BS +,  no distension, tenderness, rebound or guarding.  Musculoskeletal: Normal range of motion. No edema and no tenderness.  Lymphadenopathy: No lymphadenopathy noted, cervical, inguinal. Neuro: Alert. Normal reflexes, muscle tone coordination. No cranial nerve deficit. Skin: Skin is warm and dry. No rash noted. Not diaphoretic. No erythema. No pallor.  Psychiatric: Normal mood and affect. Behavior, judgment, thought content normal.   Lab Results  Component Value Date   WBC 4.1 03/11/2013   HGB 15.4* 03/11/2013   HCT 45.9 03/11/2013   MCV 84.8 03/11/2013   PLT 252 03/11/2013   Lab Results  Component Value Date   CREATININE 1.39* 03/11/2013   BUN 15 03/11/2013   NA 135 03/11/2013   K 4.5 03/11/2013   CL 98 03/11/2013   CO2 27 03/11/2013    Lab Results  Component Value Date   HGBA1C 14.0% 03/11/2013   Lipid Panel     Component Value Date/Time   CHOL 275* 03/11/2013 1723   TRIG 268* 03/11/2013 1723   HDL 57 03/11/2013 1723   CHOLHDL 4.8 03/11/2013 1723   VLDL 54* 03/11/2013 1723   LDLCALC 164* 03/11/2013 1723       Assessment and plan:   Patient Active Problem List   Diagnosis Date Noted  . Dyslipidemia 03/11/2013  . Hyperglycemia 03/11/2013  . Chronic renal insufficiency,  stage I 11/25/2010  . Renal insufficiency 11/15/2010  . SYSTOLIC HEART FAILURE, CHRONIC 10/21/2010  . CAD, NATIVE VESSEL 10/18/2010  . SYSTOLIC HEART FAILURE, ACUTE ON CHRONIC 10/18/2010  . DYSPNEA 10/18/2010  . TRANSAMINASES, SERUM, ELEVATED 12/20/2007  . DM, UNCOMPLICATED, TYPE II 02/12/2007  . HYPERLIPIDEMIA 02/08/2007  . HYPERTENSION 01/18/2007   Myalgia and myositis - Plan: COMPLETE METABOLIC PANEL WITH GFR, Cardiac panel  CKD (chronic kidney disease) stage 3, GFR 30-59 ml/min - Plan: COMPLETE METABOLIC PANEL WITH GFR, Cardiac panel  Increase 70/30 insulin to 15 units BID with meals  Check CMP and CK level today  Education today provided on diabetes testing and hypoglycemia  RTC in 6  weeks  The patient was given clear instructions to go to ER or return to medical center if symptoms don't improve, worsen or new problems develop.  The patient verbalized understanding.  The patient was told to call to get any lab results if not heard anything in the next week.    Rodney Langton, MD, CDE, FAAFP [facility] [place], [place]

## 2013-03-18 NOTE — Progress Notes (Signed)
Patient presents for follow up for DM and maintenance of insulin dosage; states her blood sugar is still high; states level was 255 before eating this morning.

## 2013-03-18 NOTE — Patient Instructions (Addendum)
Myalgia, Adult Myalgia is the medical term for muscle pain. It is a symptom of many things. Nearly everyone at some time in their life has this. The most common cause for muscle pain is overuse or straining and more so when you are not in shape. Injuries and muscle bruises cause myalgias. Muscle pain without a history of injury can also be caused by a virus. It frequently comes along with the flu. Myalgia not caused by muscle strain can be present in a large number of infectious diseases. Some autoimmune diseases like lupus and fibromyalgia can cause muscle pain. Myalgia may be mild, or severe. SYMPTOMS  The symptoms of myalgia are simply muscle pain. Most of the time this is short lived and the pain goes away without treatment. DIAGNOSIS  Myalgia is diagnosed by your caregiver by taking your history. This means you tell him when the problems began, what they are, and what has been happening. If this has not been a long term problem, your caregiver may want to watch for a while to see what will happen. If it has been long term, they may want to do additional testing. TREATMENT  The treatment depends on what the underlying cause of the muscle pain is. Often anti-inflammatory medications will help. HOME CARE INSTRUCTIONS  If the pain in your muscles came from overuse, slow down your activities until the problems go away.  Myalgia from overuse of a muscle can be treated with alternating hot and cold packs on the muscle affected or with cold for the first couple days. If either heat or cold seems to make things worse, stop their use.  Apply ice to the sore area for 15-20 minutes, 3-4 times per day, while awake for the first 2 days of muscle soreness, or as directed. Put the ice in a plastic bag and place a towel between the bag of ice and your skin.  Only take over-the-counter or prescription medicines for pain, discomfort, or fever as directed by your caregiver.  Regular gentle exercise may help if  you are not active.  Stretching before strenuous exercise can help lower the risk of myalgia. It is normal when beginning an exercise regimen to feel some muscle pain after exercising. Muscles that have not been used frequently will be sore at first. If the pain is extreme, this may mean injury to a muscle. SEEK MEDICAL CARE IF:  You have an increase in muscle pain that is not relieved with medication.  You begin to run a temperature.  You develop nausea and vomiting.  You develop a stiff and painful neck.  You develop a rash.  You develop muscle pain after a tick bite.  You have continued muscle pain while working out even after you are in good condition. SEEK IMMEDIATE MEDICAL CARE IF: Any of your problems are getting worse and medications are not helping. MAKE SURE YOU:   Understand these instructions.  Will watch your condition.  Will get help right away if you are not doing well or get worse. Document Released: 06/26/2006 Document Revised: 10/27/2011 Document Reviewed: 09/15/2006 [facility] Patient Information 2014 [place], [place]. Hypoglycemia (Low Blood Sugar) Hypoglycemia is when the glucose (sugar) in your blood is too low. Hypoglycemia can happen for many reasons. It can happen to people with or without diabetes. Hypoglycemia can develop quickly and can be a medical emergency.  CAUSES  Having hypoglycemia does not mean that you will develop diabetes. Different causes include:  Missed or delayed meals or not enough  carbohydrates eaten.  Medication overdose. This could be by accident or deliberate. If by accident, your medication may need to be adjusted or changed.  Exercise or increased activity without adjustments in carbohydrates or medications.  A nerve disorder that affects body functions like your heart rate, blood pressure and digestion (autonomic neuropathy).  A condition where the stomach muscles do not function properly (gastroparesis). Therefore, medications  may not absorb properly.  The inability to recognize the signs of hypoglycemia (hypoglycemic unawareness).  Absorption of insulin  may be altered.  Alcohol consumption.  Pregnancy/menstrual cycles/postpartum. This may be due to hormones.  Certain kinds of tumors. This is very rare. SYMPTOMS   Sweating.  Hunger.  Dizziness.  Blurred vision.  Drowsiness.  Weakness.  Headache.  Rapid heart beat.  Shakiness.  Nervousness. DIAGNOSIS  Diagnosis is made by monitoring blood glucose in one or all of the following ways:  Fingerstick blood glucose monitoring.  Laboratory results. TREATMENT  If you think your blood glucose is low:  Check your blood glucose, if possible. If it is less than 70 mg/dl, take one of the following:  3-4 glucose tablets.   cup juice (prefer clear like apple).   cup "regular" soda pop.  1 cup milk.  -1 tube of glucose gel.  5-6 hard candies.  Do not over treat because your blood glucose (sugar) will only go too high.  Wait 15 minutes and recheck your blood glucose. If it is still less than 70 mg/dl (or below your target range), repeat treatment.  Eat a snack if it is more than one hour until your next meal. Sometimes, your blood glucose may go so low that you are unable to treat yourself. You may need someone to help you. You may even pass out or be unable to swallow. This may require you to get an injection of glucagon, which raises the blood glucose. HOME CARE INSTRUCTIONS  Check blood glucose as recommended by your caregiver.  Take medication as prescribed by your caregiver.  Follow your meal plan. Do not skip meals. Eat on time.  If you are going to drink alcohol, drink it only with meals.  Check your blood glucose before driving.  Check your blood glucose before and after exercise. If you exercise longer or different than usual, be sure to check blood glucose more frequently.  Always carry treatment with you. Glucose  tablets are the easiest to carry.  Always wear medical alert jewelry or carry some form of identification that states that you have diabetes. This will alert people that you have diabetes. If you have hypoglycemia, they will have a better idea on what to do. SEEK MEDICAL CARE IF:   You are having problems keeping your blood sugar at target range.  You are having frequent episodes of hypoglycemia.  You feel you might be having side effects from your medicines.  You have symptoms of an illness that is not improving after 3-4 days.  You notice a change in vision or a new problem with your vision. SEEK IMMEDIATE MEDICAL CARE IF:   You are a family member or friend of a person whose blood glucose goes below 70 mg/dl and is accompanied by:  Confusion.  A change in mental status.  The inability to swallow.  Passing out. Document Released: 08/04/2005 Document Revised: 10/27/2011 Document Reviewed: 12/01/2011 [facility] Patient Information 2014 [place], [place]. Blood Sugar Monitoring, Adult GLUCOSE METERS FOR SELF-MONITORING OF BLOOD GLUCOSE  It is important to be able to correctly measure  your blood sugar (glucose). You can use a blood glucose monitor (a small battery-operated device) to check your glucose level at any time. This allows you and your caregiver to monitor your diabetes and to determine how well your treatment plan is working. The process of monitoring your blood glucose with a glucose meter is called self-monitoring of blood glucose (SMBG). When people with diabetes control their blood sugar, they have better health. To test for glucose with a typical glucose meter, place the disposable strip in the meter. Then place a small sample of blood on the "test strip." The test strip is coated with chemicals that combine with glucose in blood. The meter measures how much glucose is present. The meter displays the glucose level as a number. Several new models can record and store a number  of test results. Some models can connect to personal computers to store test results or print them out.  Newer meters are often easier to use than older models. Some meters allow you to get blood from places other than your fingertip. Some new models have automatic timing, error codes, signals, or barcode readers to help with proper adjustment (calibration). Some meters have a large display screen or spoken instructions for people with visual impairments.  INSTRUCTIONS FOR USING GLUCOSE METERS  Wash your hands with soap and warm water, or clean the area with alcohol. Dry your hands completely.  Prick the side of your fingertip with a lancet (a sharp-pointed tool used by hand).  Hold the hand down and gently milk the finger until a small drop of blood appears. Catch the blood with the test strip.  Follow the instructions for inserting the test strip and using the SMBG meter. Most meters require the meter to be turned on and the test strip to be inserted before applying the blood sample.  Record the test result.  Read the instructions carefully for both the meter and the test strips that go with it. Meter instructions are found in the user manual. Keep this manual to help you solve any problems that may arise. Many meters use "error codes" when there is a problem with the meter, the test strip, or the blood sample on the strip. You will need the manual to understand these error codes and fix the problem.  New devices are available such as laser lancets and meters that can test blood taken from "alternative sites" of the body, other than fingertips. However, you should use standard fingertip testing if your glucose changes rapidly. Also, use standard testing if:  You have eaten, exercised, or taken insulin in the past 2 hours.  You think your glucose is low.  You tend to not feel symptoms of low blood glucose (hypoglycemia).  You are ill or under stress.  Clean the meter as directed by the  manufacturer.  Test the meter for accuracy as directed by the manufacturer.  Take your meter with you to your caregiver's office. This way, you can test your glucose in front of your caregiver to make sure you are using the meter correctly. Your caregiver can also take a sample of blood to test using a routine lab method. If values on the glucose meter are close to the lab results, you and your caregiver will see that your meter is working well and you are using good technique. Your caregiver will advise you about what to do if the results do not match. FREQUENCY OF TESTING  Your caregiver will tell you how often you  should check your blood glucose. This will depend on your type of diabetes, your current level of diabetes control, and your types of medicines. The following are general guidelines, but your care plan may be different. Record all your readings and the time of day you took them for review with your caregiver.   Diabetes type 1.  When you are using insulin with good diabetic control (either multiple daily injections or via a pump), you should check your glucose 4 times a day.  If your diabetes is not well controlled, you may need to monitor more frequently, including before meals and 2 hours after meals, at bedtime, and occasionally between 2 a.m. and 3 a.m.  You should always check your glucose before a dose of insulin or before changing the rate on your insulin pump.  Diabetes type 2.  Guidelines for SMBG in diabetes type 2 are not as well defined.  If you are on insulin, follow the guidelines above.  If you are on medicines, but not insulin, and your glucose is not well controlled, you should test at least twice daily.  If you are not on insulin, and your diabetes is controlled with medicines or diet alone, you should test at least once daily, usually before breakfast.  A weekly profile will help your caregiver advise you on your care plan. The week before your visit, check  your glucose before a meal and 2 hours after a meal at least daily. You may want to test before and after a different meal each day so you and your caregiver can tell how well controlled your blood sugars are throughout the course of a 24 hour period.  Gestational diabetes (diabetes during pregnancy).  Frequent testing is often necessary. Accurate timing is important.  If you are not on insulin, check your glucose 4 times a day. Check it before breakfast and 1 hour after the start of each meal.  If you are on insulin, check your glucose 6 times a day. Check it before each meal and 1 hour after the first bite of each meal.  General guidelines.  More frequent testing is required at the start of insulin treatment. Your caregiver will instruct you.  Test your glucose any time you suspect you have low blood sugar (hypoglycemia).  You should test more often when you change medicines, when you have unusual stress or illness, or in other unusual circumstances. OTHER THINGS TO KNOW ABOUT GLUCOSE METERS  Measurement Range. Most glucose meters are able to read glucose levels over a broad range of values from as low as 0 to as high as 600 mg/dL. If you get an extremely high or low reading from your meter, you should first confirm it with another reading. Report very high or very low readings to your caregiver.  Whole Blood Glucose versus Plasma Glucose. Some older home glucose meters measure glucose in your whole blood. In a lab or when using some newer home glucose meters, the glucose is measured in your plasma (one component of blood). The difference can be important. It is important for you and your caregiver to know whether your meter gives its results as "whole blood equivalent" or "plasma equivalent."  Display of High and Low Glucose Values. Part of learning how to operate a meter is understanding what the meter results mean. Know how high and low glucose concentrations are displayed on your  meter.  Factors that Affect Glucose Meter Performance. The accuracy of your test results depends on many factors  and varies depending on the brand and type of meter. These factors include:  Low red blood cell count (anemia).  Substances in your blood (such as uric acid, vitamin C, and others).  Environmental factors (temperature, humidity, altitude).  Name-brand versus generic test strips.  Calibration. Make sure your meter is set up properly. It is a good idea to do a calibration test with a control solution recommended by the manufacturer of your meter whenever you begin using a fresh bottle of test strips. This will help verify the accuracy of your meter.  Improperly stored, expired, or defective test strips. Keep your strips in a dry place with the lid on.  Soiled meter.  Inadequate blood sample. NEW TECHNOLOGIES FOR GLUCOSE TESTING Alternative site testing Some glucose meters allow testing blood from alternative sites. These include the:  Upper arm.  Forearm.  Base of the thumb.  Thigh. Sampling blood from alternative sites may be desirable. However, it may have some limitations. Blood in the fingertips show changes in glucose levels more quickly than blood in other parts of the body. This means that alternative site test results may be different from fingertip test results, not because of the meter's ability to test accurately, but because the actual glucose concentration can be different.  Continuous Glucose Monitoring Devices to measure your blood glucose continuously are available, and others are in development. These methods can be more expensive than self-monitoring with a glucose meter. However, it is uncertain how effective and reliable these devices are. Your caregiver will advise you if this approach makes sense for you. IF BLOOD SUGARS ARE CONTROLLED, PEOPLE WITH DIABETES REMAIN HEALTHIER.  SMBG is an important part of the treatment plan of patients with diabetes  mellitus. Below are reasons for using SMBG:   It confirms that your glucose is at a specific, healthy level.  It detects hypoglycemia and severe hyperglycemia.  It allows you and your caregiver to make adjustments in response to changes in lifestyle for individuals requiring medicine.  It determines the need for starting insulin therapy in temporary diabetes that happens during pregnancy (gestational diabetes). Document Released: 08/07/2003 Document Revised: 10/27/2011 Document Reviewed: 11/28/2010 [facility] Patient Information 2014 [place], [place].

## 2013-03-20 NOTE — Progress Notes (Signed)
Quick Note:  Please inform patient that her metabolic panel reveals slight improvement in kidney function.   Rodney Langton, MD, CDE, FAAFP [facility] [place], [place]   ______

## 2013-03-21 ENCOUNTER — Telehealth: Payer: Self-pay | Admitting: *Deleted

## 2013-03-21 NOTE — Telephone Encounter (Signed)
03/21/13 Spoke with patient and made aware that her metabolic panel reveals slight improvement in  Kidney function per Dr. Laural Benes P[facility]

## 2013-03-24 ENCOUNTER — Encounter: Payer: No Typology Code available for payment source | Attending: Family Medicine | Admitting: Dietician

## 2013-03-24 VITALS — Ht 65.0 in | Wt 218.5 lb

## 2013-03-24 DIAGNOSIS — E119 Type 2 diabetes mellitus without complications: Secondary | ICD-10-CM | POA: Insufficient documentation

## 2013-03-24 DIAGNOSIS — Z713 Dietary counseling and surveillance: Secondary | ICD-10-CM | POA: Insufficient documentation

## 2013-03-24 NOTE — Patient Instructions (Addendum)
Goals:  Follow Diabetes Meal Plan as instructed  Eat 3 meals and 2 snacks, every 3-5 hrs  Limit carbohydrate intake to 30-45 grams carbohydrate/meal  Limit carbohydrate intake to 15 grams carbohydrate/snack  Add lean protein foods to meals/snacks  Monitor glucose levels as instructed by your doctor  Aim for 30 mins of physical activity daily  Bring food record and glucose log to your next nutrition visit  Bring monitor and log book to the next appointment

## 2013-03-24 NOTE — Progress Notes (Signed)
Appt start time: 1100 end time:  1200.   Assessment:  Patient was seen on  03/24/2013 for individual diabetes education. Davelyn saw doctor for the first time in a couple years two weeks ago when she first got insurance (orange card). State she was having a lot of pain all over her body for a long time. Stated she used to walk 5 miles every other day and would go swimming before she had a  heart attack in 09/2010. Slowed down after that but attempted to start walking again (about 1 mile) every other day until she completely stopped completely when she felt "pinches". Reports having some knowledge about diabetes since he mother had it and also reports having some numbness her big toe.   Was taking Trigenta, aspirin, crestor, MVI, aldactone, and hydralazine up until two weeks ago when she started working with a new doctor. Currently taking insulin for the first time.  Current HbA1c: 14.0% on July 25th  MEDICATIONS: see list   DIETARY INTAKE:  24-hr recall:  B ( AM): boiled egg, [place] bacon or sausage patty, toast, and applesauce with water and sometimes half a cup of coffee  Snk ( AM): peanut butter crackers or fruit with water   L ( PM): meat and veggies or peanut butter crackers, leftovers  Snk ( PM): fruit or peanut butter crackers (no salt crackers) D ( PM): baked pork chop and mixed greens with water or sweet tea Snk ( PM): peanut butter crackers or fruit Beverages: mostly water and 16 oz of sweet tea every other day  Usual physical activity: none  Estimated energy needs: 1600 calories 180 g carbohydrates 120 g protein 44 g fat  Progress Towards Goal(s):  In progress.   Nutritional Diagnosis:  [place]-2.1 Impaired nutrition utilization As related to glucose metabolism.  As evidenced by Hgb A1c 14% and doctor recommendation for DSME.    Intervention:  Nutrition counseling provided.   Provided education on macronutrients on glucose levels.  Provided education on carb counting,  importance of regularly scheduled meals/snacks, and meal planning  Reviewed patient medications.  Discussed role of medication on blood glucose and possible side effects  Discussed blood glucose monitoring and interpretation.  Discussed recommended target ranges and individual ranges.    Described short-term complications: hyper- and hypo-glycemia.  Discussed causes,symptoms, and treatment options.   Not discussed d/t time constraints. Will address at follow-up:  Discussed prevention, detection, and treatment of long-term complications. Discussed the role of prolonged elevated glucose levels on body systems.  Discussed role of stress on blood glucose levels and discussed strategies to manage psychosocial issues.  Discussed recommendations for long-term diabetes self-care. Established checklist for medical, dental, and emotional self-care. Discussed diabetes disease process and treatment options.  Discussed physiology of diabetes and role of obesity on insulin resistance.  Encouraged moderate weight reduction to improve glucose levels.  Discussed role of medications and diet in glucose control Discussed effects of physical activity on glucose levels and long-term glucose control.  Recommended 150 minutes of physical activity/week.  Handouts given during visit include: Living Well with Diabetes book Marketing executive) [employer] Handout   Barriers to learning/adherance to lifestyle change: none  Diabetes self-care support plan:   [facility] support group  Return in two weeks  Monitoring/Evaluation:  Dietary intake, exercise, and body weight in 2 week(s).

## 2013-04-04 ENCOUNTER — Ambulatory Visit (INDEPENDENT_AMBULATORY_CARE_PROVIDER_SITE_OTHER): Payer: No Typology Code available for payment source | Admitting: Home Health Services

## 2013-04-04 DIAGNOSIS — E119 Type 2 diabetes mellitus without complications: Secondary | ICD-10-CM

## 2013-04-04 NOTE — Progress Notes (Signed)
DIABETES Pt came in to have a retinal scan per diabetic care.   Image was taken and submitted to [facility]-DR. Garg for reading.    Results will be available in 1-2 weeks.  Results will be given to PCP for review and to contact patient.  Brittany Evans  

## 2013-04-05 ENCOUNTER — Encounter: Payer: No Typology Code available for payment source | Admitting: Dietician

## 2013-04-05 VITALS — Ht 65.0 in | Wt 216.3 lb

## 2013-04-05 DIAGNOSIS — E119 Type 2 diabetes mellitus without complications: Secondary | ICD-10-CM

## 2013-04-05 NOTE — Progress Notes (Signed)
Appt start time: 0415 end time:  0445.   Assessment:    Patient was seen on 04/05/2013 to complete diabetes education. Brittany Evans brought her blood sugar log and most of her numbers were in the target range. Brittany Evans was much more relaxed this appointment and confident that she could manage her diabetes.    Current HbA1c: 14.0% on July 25th  MEDICATIONS: see list   DIETARY INTAKE:  24-hr recall:  B ( AM): boiled egg, [place] bacon or sausage patty, toast, and applesauce with water and sometimes half a cup of coffee  Snk ( AM): peanut butter crackers or fruit with water   L ( PM): meat and veggies or peanut butter crackers, leftovers  Snk ( PM): fruit or peanut butter crackers (no salt crackers) D ( PM): baked pork chop and mixed greens with water or sweet tea Snk ( PM): peanut butter crackers or fruit Beverages: mostly water and 16 oz of sweet tea every other day  Usual physical activity: none  Estimated energy needs: 1600 calories 180 g carbohydrates 120 g protein 44 g fat  Progress Towards Goal(s):  Some progress.   Nutritional Diagnosis:  [place]-2.1 Impaired nutrition utilization As related to glucose metabolism.  As evidenced by Hgb A1c 14% and doctor recommendation for DSME.    Intervention:  Nutrition counseling provided.  Discussed prevention, detection, and treatment of long-term complications. Discussed the role of prolonged elevated glucose levels on body systems.  Discussed role of stress on blood glucose levels and discussed strategies to manage psychosocial issues.  Discussed recommendations for long-term diabetes self-care. Established checklist for medical, dental, and emotional self-care. Discussed diabetes disease process and treatment options.  Discussed physiology of diabetes and role of obesity on insulin resistance.  Encouraged moderate weight reduction to improve glucose levels.  Discussed role of medications and diet in glucose control Discussed effects of  physical activity on glucose levels and long-term glucose control.  Recommended 150 minutes of physical activity/week.   Handouts given during visit include: Diabetes Care Schedule for Adultls   Barriers to learning/adherance to lifestyle change: none  Diabetes self-care support plan:   [facility] support group  Siblings and other family  Monitoring/Evaluation:  Dietary intake, exercise, and body weight in 3 months.

## 2013-04-05 NOTE — Patient Instructions (Addendum)
Goals:  Follow Diabetes Meal Plan as instructed  Eat 3 meals and 2 snacks, every 3-5 hrs  Limit carbohydrate intake to 30-45 grams carbohydrate/meal  Limit carbohydrate intake to 15 grams carbohydrate/snack  Add lean protein foods to meals/snacks  Check in with doctor about starting gentle exercise as much as you can tolerate  Consider coming to Diabetes Support group Monday 04/25/2013 from 6-7 PM

## 2013-04-20 ENCOUNTER — Ambulatory Visit (INDEPENDENT_AMBULATORY_CARE_PROVIDER_SITE_OTHER): Payer: No Typology Code available for payment source | Admitting: Cardiology

## 2013-04-20 ENCOUNTER — Encounter: Payer: Self-pay | Admitting: Cardiology

## 2013-04-20 VITALS — BP 136/86 | HR 55 | Ht 65.0 in | Wt 217.0 lb

## 2013-04-20 DIAGNOSIS — I5022 Chronic systolic (congestive) heart failure: Secondary | ICD-10-CM

## 2013-04-20 MED ORDER — ENALAPRIL MALEATE 10 MG PO TABS: 10.0000 mg | ORAL_TABLET | Freq: Two times a day (BID) | ORAL | Status: DC

## 2013-04-20 NOTE — Patient Instructions (Addendum)
Please change for Quinapril to Enalapril 10 mg one twice a day. Continue all other medications as listed.  Your physician has requested that you have an echocardiogram. Echocardiography is a painless test that uses sound waves to create images of your heart. It provides your doctor with information about the size and shape of your heart and how well your heart's chambers and valves are working. This procedure takes approximately one hour. There are no restrictions for this procedure.  Follow up in 3 months with Dr Antoine Poche.

## 2013-04-20 NOTE — Progress Notes (Signed)
HPI The patient presents as a new patient for me. She had previously seen Dr. Gala Romney.  She has a history of coronary disease as described below. However, her cardiomyopathy is felt to be nonischemic. He previously had an ejection fraction of about 30% in 2008 although it appeared to be improved to about 45% by most recent echo a couple of years ago.  She has had some difficulty getting continuity of care. She is now being seen in the adult free clinic. She's been able to get her medications which she said she never really ran out of. She does report a period recently where her blood pressure was not well-controlled though she claimed to be compliant with her medicines. She has had recent adjustment of her medications. Her LDL recently was 164 with a hemoglobin A1c of 14. This is now actively being managed. She does get short of breath climbing halfway up a flight of stairs. She is not describing resting shortness of breath, PND or orthopnea. She's not describing palpitations, presyncope or syncope.  She is limited somewhat by back pain. She has not had any new chest pressure, neck or arm discomfort.  She does have some mild left leg swelling.  No Known Allergies  Current Outpatient Prescriptions  Medication Sig Dispense Refill  . albuterol (PROAIR HFA) 108 (90 BASE) MCG/ACT inhaler Inhale 2 puffs into the lungs every 6 (six) hours as needed. Shortness of breath      . aspirin 81 MG tablet Take 81 mg by mouth daily.      . carvedilol (COREG) 6.25 MG tablet Take 1 tablet (6.25 mg total) by mouth 2 (two) times daily with a meal.  60 tablet  2  . Ergocalciferol (VITAMIN D2 PO) Take 50,000 Units by mouth once a week.      . hydrALAZINE (APRESOLINE) 25 MG tablet Take 1 tablet (25 mg total) by mouth 3 (three) times daily.  90 tablet  2  . insulin NPH-regular (RELION 70/30) (70-30) 100 UNIT/ML injection Inject 15 Units into the skin 2 (two) times daily with a meal.  10 mL  5  . Insulin Syringe-Needle  U-100 (INSULIN SYRINGE .3CC/31GX5/16") 31G X 5/16" 0.3 ML MISC 1 Syringe by Does not apply route as directed.  100 each  5  . Multiple Vitamin (MULTIVITAMIN) tablet TAKE ONE GUMMY MULTIVITAMIN DAILY      . nitroGLYCERIN (NITROSTAT) 0.4 MG SL tablet Place 0.4 mg under the tongue every 5 (five) minutes as needed. Chest pain      . quinapril (ACCUPRIL) 10 MG tablet Take 1 tablet (10 mg total) by mouth daily.  30 tablet  2  . rosuvastatin (CRESTOR) 40 MG tablet TAKE 1/2 TABLET EVERY OTHER DAY      . spironolactone (ALDACTONE) 25 MG tablet Take 1 tablet (25 mg total) by mouth daily.  30 tablet  2   No current facility-administered medications for this visit.    Past Medical History  Diagnosis Date  . Systolic CHF   . Diabetes mellitus   . Hypertension   . Asthma   . NICM (nonischemic cardiomyopathy)     EF 20-25%  . CAD (coronary artery disease)     NSTEMI 2/12: LAD 30%, D1 40%, oCFX 70-80%, mRCA 10-20%, ?occl. of apical LAD; treated medically  . Hyperlipidemia   . Obesity     No past surgical history on file.  ROS:    Snoring.  Otherwise as stated in the HPI and negative for all other  systems.  PHYSICAL EXAM BP 136/86  Pulse 55  Ht 5\' 5"  (1.651 m)  Wt 217 lb (98.431 kg)  BMI 36.11 kg/m2  SpO2 97% GENERAL:  Well appearing HEENT:  Pupils equal round and reactive, fundi not visualized, oral mucosa unremarkable NECK:  No jugular venous distention, waveform within normal limits, carotid upstroke brisk and symmetric, no bruits, no thyromegaly LYMPHATICS:  No cervical, inguinal adenopathy LUNGS:  Clear to auscultation bilaterally BACK:  No CVA tenderness CHEST:  Unremarkable HEART:  PMI not displaced or sustained,S1 and S2 within normal limits, no S3, no S4, no clicks, no rubs, no murmurs ABD:  Flat, positive bowel sounds normal in frequency in pitch, no bruits, no rebound, no guarding, no midline pulsatile mass, no hepatomegaly, no splenomegaly EXT:  2 plus pulses throughout, no  edema, no cyanosis no clubbing SKIN:  No rashes no nodules NEURO:  Cranial nerves II through XII grossly intact, motor grossly intact throughout [facility]:  Cognitively intact, oriented to person place and time'  EKG:  Sinus bradycardia, rate 55, axis within normal limits, intervals within normal limits, nonspecific lateral T-wave changes.  04/20/2013  ASSESSMENT AND PLAN  CARDIOMYOPATHY:  She seems to be euvolemic. I will check an echocardiogram to evaluate her ejection fraction. Further evaluation will be as below.  CAD:  She has not had any objective symptoms consistent with ischemia. At this point no further ischemia testing is planned although we will pursue aggressive risk reduction.  HTN:  Her blood pressure appears to be well controlled. She will continue the meds as listed.

## 2013-04-21 ENCOUNTER — Encounter: Payer: Self-pay | Admitting: Cardiology

## 2013-04-22 ENCOUNTER — Ambulatory Visit: Payer: No Typology Code available for payment source | Admitting: Cardiology

## 2013-04-29 ENCOUNTER — Ambulatory Visit: Payer: No Typology Code available for payment source | Attending: Internal Medicine | Admitting: Internal Medicine

## 2013-04-29 ENCOUNTER — Encounter: Payer: Self-pay | Admitting: Internal Medicine

## 2013-04-29 VITALS — BP 143/79 | HR 57 | Temp 99.3°F | Resp 16 | Ht 64.57 in | Wt 220.0 lb

## 2013-04-29 DIAGNOSIS — I129 Hypertensive chronic kidney disease with stage 1 through stage 4 chronic kidney disease, or unspecified chronic kidney disease: Secondary | ICD-10-CM | POA: Insufficient documentation

## 2013-04-29 DIAGNOSIS — E785 Hyperlipidemia, unspecified: Secondary | ICD-10-CM

## 2013-04-29 DIAGNOSIS — I5042 Chronic combined systolic (congestive) and diastolic (congestive) heart failure: Secondary | ICD-10-CM | POA: Insufficient documentation

## 2013-04-29 DIAGNOSIS — E1165 Type 2 diabetes mellitus with hyperglycemia: Secondary | ICD-10-CM | POA: Insufficient documentation

## 2013-04-29 DIAGNOSIS — I5022 Chronic systolic (congestive) heart failure: Secondary | ICD-10-CM

## 2013-04-29 DIAGNOSIS — I2510 Atherosclerotic heart disease of native coronary artery without angina pectoris: Secondary | ICD-10-CM | POA: Insufficient documentation

## 2013-04-29 DIAGNOSIS — E1059 Type 1 diabetes mellitus with other circulatory complications: Secondary | ICD-10-CM

## 2013-04-29 DIAGNOSIS — IMO0001 Reserved for inherently not codable concepts without codable children: Secondary | ICD-10-CM | POA: Insufficient documentation

## 2013-04-29 DIAGNOSIS — N183 Chronic kidney disease, stage 3 unspecified: Secondary | ICD-10-CM | POA: Insufficient documentation

## 2013-04-29 DIAGNOSIS — I10 Essential (primary) hypertension: Secondary | ICD-10-CM

## 2013-04-29 LAB — LIPID PANEL
HDL: 53 mg/dL (ref >39)
LDL Cholesterol: 87 mg/dL (ref 0–99)
Total CHOL/HDL Ratio: 2.9 Ratio

## 2013-04-29 LAB — BASIC METABOLIC PANEL
Calcium: 9.4 mg/dL (ref 8.4–10.5)
Sodium: 139 mEq/L (ref 135–145)

## 2013-04-29 LAB — GLUCOSE, POCT (MANUAL RESULT ENTRY): POC Glucose: 138 mg/dl (ref 70–99)

## 2013-04-29 MED ORDER — ASPIRIN 81 MG PO TABS: 81.0000 mg | ORAL_TABLET | Freq: Every day | ORAL | Status: AC

## 2013-04-29 MED ORDER — INSULIN NPH ISOPHANE & REGULAR (70-30) 100 UNIT/ML ~~LOC~~ SUSP: 15.0000 [IU] | Freq: Two times a day (BID) | SUBCUTANEOUS | Status: DC

## 2013-04-29 MED ORDER — ONE-DAILY MULTI VITAMINS PO TABS: 1.0000 | ORAL_TABLET | Freq: Every day | ORAL | Status: DC

## 2013-04-29 MED ORDER — ROSUVASTATIN CALCIUM 20 MG PO TABS: 20.0000 mg | ORAL_TABLET | Freq: Every day | ORAL | Status: DC

## 2013-04-29 MED ORDER — HYDRALAZINE HCL 50 MG PO TABS: 50.0000 mg | ORAL_TABLET | Freq: Three times a day (TID) | ORAL | Status: DC

## 2013-04-29 MED ORDER — "INSULIN SYRINGE 31G X 5/16"" 0.3 ML MISC": 1.0000 | Status: DC

## 2013-04-29 MED ORDER — CARVEDILOL 6.25 MG PO TABS: 6.2500 mg | ORAL_TABLET | Freq: Two times a day (BID) | ORAL | Status: DC

## 2013-04-29 MED ORDER — SPIRONOLACTONE 25 MG PO TABS: 25.0000 mg | ORAL_TABLET | Freq: Every day | ORAL | Status: DC

## 2013-04-29 MED ORDER — ALBUTEROL SULFATE HFA 108 (90 BASE) MCG/ACT IN AERS: 2.0000 | INHALATION_SPRAY | Freq: Four times a day (QID) | RESPIRATORY_TRACT | Status: DC | PRN

## 2013-04-29 NOTE — Progress Notes (Signed)
Patient ID: Brittany Evans, female   DOB: [birth year], 56 y.o.   MRN: [id number]  CC: Followup  HPI: 57 year old female with past medical history of chronic systolic and diastolic heart failure, last 2-D echo in 2012 with ejection fraction of 45%, uncontrolled diabetes, hypertension, asthma who presented to clinic for followup. Patient does not have significant with complaints this morning. No chest pain or shortness of breath. No fever or chills. No cough. She reports being compliant with medications.  No Known Allergies Past Medical History  Diagnosis Date  . Systolic CHF   . Diabetes mellitus   . Hypertension   . Asthma   . NICM (nonischemic cardiomyopathy)     EF 20-25%, 45% 2012  . CAD (coronary artery disease)     NSTEMI 2/12: LAD 30%, D1 40%, oCFX 70-80%, mRCA 10-20%, ?occl. of apical LAD; treated medically  . Hyperlipidemia   . Obesity    Current Outpatient Prescriptions on File Prior to Visit  Medication Sig Dispense Refill  . nitroGLYCERIN (NITROSTAT) 0.4 MG SL tablet Place 0.4 mg under the tongue every 5 (five) minutes as needed. Chest pain       No current facility-administered medications on file prior to visit.   Family History  Problem Relation Age of Onset  . Cancer Mother   . Diabetes Mother   . Coronary artery disease Mother   . Heart disease Mother   . Cirrhosis Father     Alcohol abuse  . Alcohol abuse Father   . Heart disease Father    History   Social History  . Marital Status: Single    Spouse Name: N/A    Number of Children: N/A  . Years of Education: N/A   Occupational History  . [employer]     Part Time   Social History Main Topics  . Smoking status: Never Smoker   . Smokeless tobacco: Not on file  . Alcohol Use: No  . Drug Use: No  . Sexual Activity: Not Currently   Other Topics Concern  . Not on file   Social History Narrative   Tobacco use - NO          Review of Systems  Constitutional: Negative for fever,  chills, diaphoresis, activity change, appetite change and fatigue.  HENT: Negative for ear pain, nosebleeds, congestion, facial swelling, rhinorrhea, neck pain, neck stiffness and ear discharge.   Eyes: Negative for pain, discharge, redness, itching and visual disturbance.  Respiratory: Negative for cough, choking, chest tightness, shortness of breath, wheezing and stridor.   Cardiovascular: Negative for chest pain, palpitations and leg swelling.  Gastrointestinal: Negative for abdominal distention.  Genitourinary: Negative for dysuria, urgency, frequency, hematuria, flank pain, decreased urine volume, difficulty urinating and dyspareunia.  Musculoskeletal: Negative for back pain, joint swelling, arthralgias and gait problem.  Neurological: Negative for dizziness, tremors, seizures, syncope, facial asymmetry, speech difficulty, weakness, light-headedness, numbness and headaches.  Hematological: Negative for adenopathy. Does not bruise/bleed easily.  Psychiatric/Behavioral: Negative for hallucinations, behavioral problems, confusion, dysphoric mood, decreased concentration and agitation.    Objective:   Filed Vitals:   04/29/13 0921  BP: 143/79  Pulse: 57  Temp: 99.3 F (37.4 C)  Resp: 16    Physical Exam  Constitutional: Appears well-developed and well-nourished. No distress.  HENT: Normocephalic. External right and left ear normal. Oropharynx is clear and moist.  Eyes: Conjunctivae and EOM are normal. PERRLA, no scleral icterus.  Neck: Normal ROM. Neck supple. No JVD. No tracheal deviation. No thyromegaly.  CVS: RRR, S1/S2 +, no murmurs, no gallops, no carotid bruit.  Pulmonary: Effort and breath sounds normal, no stridor, rhonchi, wheezes, rales.  Abdominal: Soft. BS +,  no distension, tenderness, rebound or guarding.  Musculoskeletal: Normal range of motion. No edema and no tenderness.  Lymphadenopathy: No lymphadenopathy noted, cervical, inguinal. Neuro: Alert. Normal reflexes,  muscle tone coordination. No cranial nerve deficit. Skin: Skin is warm and dry. No rash noted. Not diaphoretic. No erythema. No pallor.  Psychiatric: Normal mood and affect. Behavior, judgment, thought content normal.   Lab Results  Component Value Date   WBC 4.1 03/11/2013   HGB 15.4* 03/11/2013   HCT 45.9 03/11/2013   MCV 84.8 03/11/2013   PLT 252 03/11/2013   Lab Results  Component Value Date   CREATININE 1.25* 03/18/2013   BUN 18 03/18/2013   NA 139 03/18/2013   K 4.3 03/18/2013   CL 106 03/18/2013   CO2 26 03/18/2013    Lab Results  Component Value Date   HGBA1C 14.0% 03/11/2013   Lipid Panel     Component Value Date/Time   CHOL 275* 03/11/2013 1723   TRIG 268* 03/11/2013 1723   HDL 57 03/11/2013 1723   CHOLHDL 4.8 03/11/2013 1723   VLDL 54* 03/11/2013 1723   LDLCALC 164* 03/11/2013 1723       Assessment and plan:   Patient Active Problem List   Diagnosis Date Noted  . Type II or unspecified type diabetes mellitus with unspecified complication, uncontrolled 04/29/2013    Priority: Medium - Check A1c today  - Continue current insulin regimen. Patient reports a last 3 CBGs 201, 136, 108  . CKD (chronic kidney disease) stage 3, GFR 30-59 ml/min 03/18/2013    Priority: Medium - Creatinine in August 1 0.25. Check BMP today   . Dyslipidemia 03/11/2013    Priority: Medium - Continue Crestor 20 mg at bedtime  - Check lipid panel today   . CAD, NATIVE VESSEL 10/18/2010    Priority: Medium - Continue aspirin, beta blocker and statin therapy   . HYPERTENSION 01/18/2007    Priority: Medium - We have discussed target BP range - I have advised pt to check BP regularly and to call us back if the numbers are higher than 140/90 - discussed the importance of compliance with medical therapy and diet  - Blood pressure this morning 143/79. Considering patient is diabetic we would prefer systolic blood pressure less than 135  - Please do not prescribed ACE inhibitors as patient has renal  insufficiency which may worsen if she takes this medication. Increase hydralazine to 50 mg 3 times a day and continue Coreg   . SYSTOLIC AND DIASTOLIC HEART FAILURE, CHRONIC - Patient follows with lumbar cardiology. Last 2-D echo folic is in 2012 with systolic ejection fraction of 16%, grade 1 diastolic dysfunction  - Continue aspirin, beta blocker, statin therapy and spironolactone 10/21/2010    Preventive health - gyn referral provided

## 2013-04-29 NOTE — Progress Notes (Signed)
Pt is here for a f/u on DM and HTN C/o back pain and muscle spasms She is alert w/no signs of acute distress.

## 2013-04-29 NOTE — Patient Instructions (Signed)
Diabetes, Frequently Asked Questions WHAT IS DIABETES? Most of the food we eat is turned into glucose (sugar). Our bodies use it for energy. The pancreas makes a hormone called insulin. It helps glucose get into the cells of our bodies. When you have diabetes, your body either does not make enough insulin or cannot use its own insulin as well as it should. This causes sugars to build up in your blood. WHAT ARE THE SYMPTOMS OF DIABETES?  Frequent urination.  Excessive thirst.  Unexplained weight loss.  Extreme hunger.  Blurred vision.  Tingling or numbness in hands or feet.  Feeling very tired much of the time.  Dry, itchy skin.  Sores that are slow to heal.  Yeast infections. WHAT ARE THE TYPES OF DIABETES? Type 1 Diabetes   About 10% of affected people have this type.  Usually occurs before the age of 73.  Usually occurs in thin to normal weight people. Type 2 Diabetes  About 90% of affected people have this type.  Usually occurs after the age of 41.  Usually occurs in overweight people.  More likely to have:  A family history of diabetes.  A history of diabetes during pregnancy (gestational diabetes).  High blood pressure.  High cholesterol and triglycerides. Gestational Diabetes  Occurs in about 4% of pregnancies.  Usually goes away after the baby is born.  More likely to occur in women with:  Family history of diabetes.  Previous gestational diabetes.  Obese.  Over 34 years old. WHAT IS PRE-DIABETES? Pre-diabetes means your blood glucose is higher than normal, but lower than the diabetes range. It also means you are at risk of getting type 2 diabetes and heart disease. If you are told you have pre-diabetes, have your blood glucose checked again in 1 to 2 years. WHAT IS THE TREATMENT FOR DIABETES? Treatment is aimed at keeping blood glucose near normal levels at all times. Learning how to manage this yourself is important in treating diabetes.  Depending on the type of diabetes you have, your treatment will include one or more of the following:  Monitoring your blood glucose.  Meal planning.  Exercise.  Oral medicine (pills) or insulin. CAN DIABETES BE PREVENTED? With type 1 diabetes, prevention is more difficult, because the triggers that cause it are not yet known. With type 2 diabetes, prevention is more likely, with lifestyle changes:  Maintain a healthy weight.  Eat healthy.  Exercise. IS THERE A CURE FOR DIABETES? No, there is no cure for diabetes. There is a lot of research going on that is looking for a cure, and progress is being made. Diabetes can be treated and controlled. People with diabetes can manage their diabetes and lead normal, active lives. SHOULD I BE TESTED FOR DIABETES? If you are at least 56 years old, you should be tested for diabetes. You should be tested again every 3 years. If you are 45 or older and overweight, you may want to get tested more often. If you are younger than 45, overweight, and have one or more of the following risk factors, you should be tested:  Family history of diabetes.  Inactive lifestyle.  High blood pressure. WHAT ARE SOME OTHER SOURCES FOR INFORMATION ON DIABETES? The following organizations may help in your search for more information on diabetes: National Diabetes Education Program (NDEP) Internet: SolarDiscussions.es American Diabetes Association Internet: http://www.diabetes.org  Juvenile Diabetes Foundation International Internet: WetlessW[employer].is Document Released: 08/07/2003 Document Revised: 10/27/2011 Document Reviewed: 06/01/2009 [facility] Patient Information 2014 [place], [place].  Hypertension As your heart beats, it forces blood through your arteries. This force is your blood pressure. If the pressure is too high, it is called hypertension (HTN) or high blood pressure. HTN is dangerous because you may have it and not know it. High blood  pressure may mean that your heart has to work harder to pump blood. Your arteries may be narrow or stiff. The extra work puts you at risk for heart disease, stroke, and other problems.  Blood pressure consists of two numbers, a higher number over a lower, 110/72, for example. It is stated as "110 over 72." The ideal is below 120 for the top number (systolic) and under 80 for the bottom (diastolic). Write down your blood pressure today. You should pay close attention to your blood pressure if you have certain conditions such as:  Heart failure.  Prior heart attack.  Diabetes  Chronic kidney disease.  Prior stroke.  Multiple risk factors for heart disease. To see if you have HTN, your blood pressure should be measured while you are seated with your arm held at the level of the heart. It should be measured at least twice. A one-time elevated blood pressure reading (especially in the Emergency Department) does not mean that you need treatment. There may be conditions in which the blood pressure is different between your right and left arms. It is important to see your caregiver soon for a recheck. Most people have essential hypertension which means that there is not a specific cause. This type of high blood pressure may be lowered by changing lifestyle factors such as:  Stress.  Smoking.  Lack of exercise.  Excessive weight.  Drug/tobacco/alcohol use.  Eating less salt. Most people do not have symptoms from high blood pressure until it has caused damage to the body. Effective treatment can often prevent, delay or reduce that damage. TREATMENT  When a cause has been identified, treatment for high blood pressure is directed at the cause. There are a large number of medications to treat HTN. These fall into several categories, and your caregiver will help you select the medicines that are best for you. Medications may have side effects. You should review side effects with your caregiver. If  your blood pressure stays high after you have made lifestyle changes or started on medicines,   Your medication(s) may need to be changed.  Other problems may need to be addressed.  Be certain you understand your prescriptions, and know how and when to take your medicine.  Be sure to follow up with your caregiver within the time frame advised (usually within two weeks) to have your blood pressure rechecked and to review your medications.  If you are taking more than one medicine to lower your blood pressure, make sure you know how and at what times they should be taken. Taking two medicines at the same time can result in blood pressure that is too low. SEEK IMMEDIATE MEDICAL CARE IF:  You develop a severe headache, blurred or changing vision, or confusion.  You have unusual weakness or numbness, or a faint feeling.  You have severe chest or abdominal pain, vomiting, or breathing problems. MAKE SURE YOU:   Understand these instructions.  Will watch your condition.  Will get help right away if you are not doing well or get worse. Document Released: 08/04/2005 Document Revised: 10/27/2011 Document Reviewed: 03/24/2008 [facility] Patient Information 2014 [place], [place].

## 2013-05-05 ENCOUNTER — Ambulatory Visit (HOSPITAL_COMMUNITY): Payer: No Typology Code available for payment source | Attending: Cardiology | Admitting: Radiology

## 2013-05-05 DIAGNOSIS — I428 Other cardiomyopathies: Secondary | ICD-10-CM | POA: Insufficient documentation

## 2013-05-05 DIAGNOSIS — E785 Hyperlipidemia, unspecified: Secondary | ICD-10-CM | POA: Insufficient documentation

## 2013-05-05 DIAGNOSIS — I2510 Atherosclerotic heart disease of native coronary artery without angina pectoris: Secondary | ICD-10-CM | POA: Insufficient documentation

## 2013-05-05 DIAGNOSIS — E119 Type 2 diabetes mellitus without complications: Secondary | ICD-10-CM | POA: Insufficient documentation

## 2013-05-05 DIAGNOSIS — I129 Hypertensive chronic kidney disease with stage 1 through stage 4 chronic kidney disease, or unspecified chronic kidney disease: Secondary | ICD-10-CM | POA: Insufficient documentation

## 2013-05-05 DIAGNOSIS — N189 Chronic kidney disease, unspecified: Secondary | ICD-10-CM | POA: Insufficient documentation

## 2013-05-05 DIAGNOSIS — I5022 Chronic systolic (congestive) heart failure: Secondary | ICD-10-CM | POA: Insufficient documentation

## 2013-05-05 NOTE — Progress Notes (Signed)
Echocardiogram performed.  

## 2013-06-14 ENCOUNTER — Other Ambulatory Visit: Payer: No Typology Code available for payment source

## 2013-06-14 ENCOUNTER — Ambulatory Visit: Payer: No Typology Code available for payment source | Attending: Internal Medicine

## 2013-06-14 DIAGNOSIS — E119 Type 2 diabetes mellitus without complications: Secondary | ICD-10-CM

## 2013-06-14 LAB — POCT GLYCOSYLATED HEMOGLOBIN (HGB A1C): Hemoglobin A1C: 7.4

## 2013-06-14 NOTE — Progress Notes (Signed)
Pt here for repeat blood work. Taking prescribed insulin and cholesterol meds.denies pain CBG- 124 at home

## 2013-06-14 NOTE — Patient Instructions (Signed)
A1C- 7.4 Pt instructed to continue taking insulin dosage and we will call with lab results

## 2013-06-15 ENCOUNTER — Telehealth: Payer: Self-pay

## 2013-06-15 LAB — LIPID PANEL
HDL: 50 mg/dL (ref >39)
LDL Cholesterol: 58 mg/dL (ref 0–99)
Total CHOL/HDL Ratio: 2.6 Ratio
Triglycerides: 108 mg/dL (ref <150)
VLDL: 22 mg/dL (ref 0–40)

## 2013-06-15 NOTE — Telephone Encounter (Signed)
Patient is aware of her lab results 

## 2013-06-15 NOTE — Telephone Encounter (Signed)
Message copied by Lestine Mount on Wed Jun 15, 2013  3:01 PM ------      Message from: Jeanann Lewandowsky E      Created: Wed Jun 15, 2013 11:28 AM       Please inform patient that her lab results are normal. Her vitamin D level has improved and her cholesterol level back to normal. Hemoglobin A1c is 7.4, and advice that she continue typed glucose control with current insulin regimen. ------

## 2013-07-05 ENCOUNTER — Encounter: Payer: No Typology Code available for payment source | Attending: Family Medicine | Admitting: Dietician

## 2013-07-05 VITALS — Ht 64.0 in | Wt 221.5 lb

## 2013-07-05 DIAGNOSIS — IMO0001 Reserved for inherently not codable concepts without codable children: Secondary | ICD-10-CM | POA: Insufficient documentation

## 2013-07-05 DIAGNOSIS — Z713 Dietary counseling and surveillance: Secondary | ICD-10-CM | POA: Insufficient documentation

## 2013-07-05 DIAGNOSIS — E1165 Type 2 diabetes mellitus with hyperglycemia: Secondary | ICD-10-CM

## 2013-07-05 NOTE — Patient Instructions (Addendum)
Goals:  Follow Diabetes Meal Plan as instructed  Eat 3 meals and 2 snacks, every 3-5 hrs  Limit carbohydrate intake to 30-45 grams carbohydrate/meal  Limit carbohydrate intake to 15 grams carbohydrate/snack  Add lean protein foods to meals/snacks  Consider coming to Diabetes Support group the second Monday of each month from 6-7 PM  Consider roasting cauliflower on 400 degrees on cookie sheet for about 20-25 minutes (until pieces are browned).   Aim to start exercising, with a goal of 30 minutes 5 x day.

## 2013-07-05 NOTE — Progress Notes (Signed)
Appt start time: 0300 end time:  0330.   Assessment:  Brittany Evans is here today for a follow up and her Hgb A1c is down to 7.4% from over 14% in July. Gained a few pounds in last few months. Starting to eat more potatoes than before.  Brought her blood sugar log, averages are from 76-over 200 mg/dl.   Wt Readings from Last 3 Encounters:  07/05/13 221 lb 8 oz (100.472 kg)  04/29/13 220 lb (99.791 kg)  04/20/13 217 lb (98.431 kg)   Ht Readings from Last 3 Encounters:  07/05/13 5\' 4"  (1.626 m)  04/29/13 5' 4.57" (1.64 m)  04/20/13 5\' 5"  (1.651 m)   Body mass index is 38 kg/(m^2). @BMIFA @ Normalized weight-for-age data available only for age 54 to 20 years. Normalized stature-for-age data available only for age 54 to 20 years.  Current HbA1c: 7.4% on 10/28  MEDICATIONS: see list   DIETARY INTAKE:  24-hr recall:  B ( AM): boiled egg, [place] bacon or sausage patty, toast, and applesauce with water and sometimes half a cup of coffee  Snk ( AM): peanut butter crackers or fruit with water   L ( PM): meat and veggies or peanut butter crackers, leftovers  Snk ( PM): fruit or peanut butter crackers (no salt crackers) D ( PM): baked pork chop and mixed greens with water  Snk ( PM): peanut butter crackers or fruit Beverages: mostly water with cinnamon stick  Usual physical activity: none  Estimated energy needs: 1600 calories 180 g carbohydrates 120 g protein 44 g fat  Progress Towards Goal(s):  Some progress.   Nutritional Diagnosis:  [place]-2.1 Impaired nutrition utilization As related to glucose metabolism.  As evidenced by Hgb A1c 14% and doctor recommendation for DSME.    Intervention:  Nutrition counseling provided. Encouraged Brittany Evans to start incorporating some exercise and continue limiting carbohydrates at meal and snack times.    Barriers to learning/adherance to lifestyle change: none  Diabetes self-care support plan:   [facility] support group  Siblings and other  family  Monitoring/Evaluation:  Dietary intake, exercise, and body weight in 3 months.

## 2013-08-10 ENCOUNTER — Ambulatory Visit: Payer: No Typology Code available for payment source | Attending: Internal Medicine

## 2013-08-10 DIAGNOSIS — Z23 Encounter for immunization: Secondary | ICD-10-CM

## 2013-09-06 ENCOUNTER — Other Ambulatory Visit: Payer: Self-pay | Admitting: Internal Medicine

## 2013-09-09 ENCOUNTER — Ambulatory Visit: Payer: No Typology Code available for payment source | Attending: Internal Medicine

## 2013-09-19 ENCOUNTER — Other Ambulatory Visit: Payer: Self-pay | Admitting: Internal Medicine

## 2013-09-29 ENCOUNTER — Other Ambulatory Visit: Payer: Self-pay | Admitting: Internal Medicine

## 2013-09-29 ENCOUNTER — Encounter: Payer: Self-pay | Admitting: Internal Medicine

## 2013-09-29 ENCOUNTER — Ambulatory Visit: Payer: No Typology Code available for payment source | Attending: Internal Medicine | Admitting: Internal Medicine

## 2013-09-29 VITALS — BP 163/90 | HR 59 | Temp 98.6°F | Resp 14 | Ht 64.0 in | Wt 225.4 lb

## 2013-09-29 DIAGNOSIS — Z7982 Long term (current) use of aspirin: Secondary | ICD-10-CM | POA: Insufficient documentation

## 2013-09-29 DIAGNOSIS — I5022 Chronic systolic (congestive) heart failure: Secondary | ICD-10-CM | POA: Insufficient documentation

## 2013-09-29 DIAGNOSIS — Z760 Encounter for issue of repeat prescription: Secondary | ICD-10-CM | POA: Insufficient documentation

## 2013-09-29 DIAGNOSIS — Z794 Long term (current) use of insulin: Secondary | ICD-10-CM | POA: Insufficient documentation

## 2013-09-29 DIAGNOSIS — E1165 Type 2 diabetes mellitus with hyperglycemia: Secondary | ICD-10-CM

## 2013-09-29 DIAGNOSIS — I10 Essential (primary) hypertension: Secondary | ICD-10-CM

## 2013-09-29 DIAGNOSIS — IMO0002 Reserved for concepts with insufficient information to code with codable children: Secondary | ICD-10-CM | POA: Insufficient documentation

## 2013-09-29 DIAGNOSIS — E118 Type 2 diabetes mellitus with unspecified complications: Secondary | ICD-10-CM

## 2013-09-29 LAB — POCT GLYCOSYLATED HEMOGLOBIN (HGB A1C): Hemoglobin A1C: 7.4

## 2013-09-29 LAB — GLUCOSE, POCT (MANUAL RESULT ENTRY): POC Glucose: 244 mg/dl — AB (ref 70–99)

## 2013-09-29 MED ORDER — HYDRALAZINE HCL 100 MG PO TABS: 100.0000 mg | ORAL_TABLET | Freq: Three times a day (TID) | ORAL | Status: DC

## 2013-09-29 NOTE — Progress Notes (Signed)
Patient is here for an office visit. Needs a physicians assistance for her SSI assessment and medication refill. Complains of her menstrual cycle coming on every time she lifts heavy weight x2 years. Also still complains of lower back pain and dizziness x8 months. Patient has a history of hypertension.

## 2013-09-30 ENCOUNTER — Other Ambulatory Visit: Payer: Self-pay | Admitting: Emergency Medicine

## 2013-09-30 ENCOUNTER — Telehealth: Payer: Self-pay | Admitting: Emergency Medicine

## 2013-09-30 MED ORDER — SPIRONOLACTONE 25 MG PO TABS
25.0000 mg | ORAL_TABLET | Freq: Every day | ORAL | Status: DC
Start: 2013-09-30 — End: 2013-12-21

## 2013-09-30 NOTE — Progress Notes (Signed)
Patient ID: Brittany Evans, female   DOB: 12/29/1955, 58 y.o.   MRN: [id number]   Brittany Evans, is a 58 y.o. female  [id number]  [id number]  DOB - Aug 12, 1956  Chief Complaint  Patient presents with  . office visit    SSI assessment, medication refill        Subjective:   Brittany Evans is a 58 y.o. female here today for a follow up visit. Patient needs assistance for her SSI assessment and medication refill.  Patient has history of hypertension, diabetes mellitus, hyperlipidemia, morbid obesity, non-ischemic cardiomyopathy, with last left ventricular ejection fraction of 45% in 2012, she also has history of STEMI treated medically. She here majorly for medication refill and to fill a paper for her Social Security. She denies shortness of breath or chest pain, no cough. She does not smoke cigarette. She reports no side effects to medications. She claims her blood sugar is better controlled with insulin regimen. Her blood pressure still slightly high. Patient has No headache, No chest pain, No abdominal pain - No Nausea, No new weakness tingling or numbness, No Cough - SOB.  No problems updated.  ALLERGIES: No Known Allergies  PAST MEDICAL HISTORY: Past Medical History  Diagnosis Date  . Systolic CHF   . Diabetes mellitus   . Hypertension   . Asthma   . NICM (nonischemic cardiomyopathy)     EF 20-25%, 45% 2012  . CAD (coronary artery disease)     NSTEMI 2/12: LAD 30%, D1 40%, oCFX 70-80%, mRCA 10-20%, ?occl. of apical LAD; treated medically  . Hyperlipidemia   . Obesity     MEDICATIONS AT HOME: Prior to Admission medications   Medication Sig Start Date End Date Taking? Authorizing Provider  albuterol (PROAIR HFA) 108 (90 BASE) MCG/ACT inhaler Inhale 2 puffs into the lungs every 6 (six) hours as needed. Shortness of breath 04/29/13  Yes Robbie Lis, MD  aspirin 81 MG tablet Take 1 tablet (81 mg total) by mouth daily. 04/29/13  Yes Robbie Lis, MD    atorvastatin (LIPITOR) 40 MG tablet TAKE 2 TABLETS BY MOUTH ONCE DAILY 09/19/13  Yes Robbie Lis, MD  hydrALAZINE (APRESOLINE) 100 MG tablet Take 1 tablet (100 mg total) by mouth 3 (three) times daily. 09/29/13  Yes Angelica Chessman, MD  insulin NPH-regular (RELION 70/30) (70-30) 100 UNIT/ML injection Inject 15 Units into the skin 2 (two) times daily with a meal. 04/29/13  Yes Robbie Lis, MD  Insulin Syringe-Needle U-100 (INSULIN SYRINGE .3CC/31GX5/16") 31G X 5/16" 0.3 ML MISC 1 Syringe by Does not apply route as directed. 04/29/13  Yes Robbie Lis, MD  Multiple Vitamin (MULTI-VITAMINS) TABS TAKE 1 TABLET BY MOUTH ONCE DAILY 09/06/13  Yes Robbie Lis, MD  nitroGLYCERIN (NITROSTAT) 0.4 MG SL tablet Place 0.4 mg under the tongue every 5 (five) minutes as needed. Chest pain   Yes Historical Provider, MD  spironolactone (ALDACTONE) 25 MG tablet Take 1 tablet (25 mg total) by mouth daily. 04/29/13  Yes Robbie Lis, MD     Objective:   Filed Vitals:   09/29/13 1723  BP: 163/90  Pulse: 59  Temp: 98.6 F (37 C)  TempSrc: Oral  Resp: 14  Height: 5\' 4"  (1.626 m)  Weight: 225 lb 6.4 oz (102.241 kg)  SpO2: 97%    Exam General appearance : Awake, alert, not in any distress. Speech Clear. Not toxic looking HEENT: Atraumatic and Normocephalic, pupils equally reactive to light and accomodation Neck: supple,  no JVD. No cervical lymphadenopathy.  Chest:Good air entry bilaterally, no added sounds  CVS: S1 S2 regular, no murmurs.  Abdomen: Bowel sounds present, Non tender and not distended with no gaurding, rigidity or rebound. Extremities: B/L Lower Ext shows no edema, both legs are warm to touch Neurology: Awake alert, and oriented X 3, CN II-XII intact, Non focal Skin:No Rash Wounds:N/A  Data Review  Assessment & Plan   1. Chronic systolic heart failure: Stable Continue current medications  2. Unspecified essential hypertension: Uncontrolled Increase hydralazine to 100 mg tablet by  mouth 3 times a day  3. Type II or unspecified type diabetes mellitus with unspecified complication, uncontrolled Hemoglobin A1c remains at 7.4% - POCT glycosylated hemoglobin (Hb A1C) - Glucose (CBG) Continue current insulin regimen Patient has been counseled extensively on nutrition and exercise  Follow up in 3 months or when necessary  The patient was given clear instructions to go to ER or return to medical center if symptoms don't improve, worsen or new problems develop. The patient verbalized understanding. The patient was told to call to get lab results if they haven't heard anything in the next week.    Angelica Chessman, MD, [place], [place], [place] [facility] [place], [place]   09/30/2013, 12:02 PM

## 2013-09-30 NOTE — Telephone Encounter (Signed)
Left message for pt to call clinic for instructions on continuing taking Aldactone 25 mg daily

## 2013-10-06 ENCOUNTER — Encounter: Payer: No Typology Code available for payment source | Attending: Internal Medicine | Admitting: Dietician

## 2013-10-06 VITALS — Ht 64.0 in | Wt 227.0 lb

## 2013-10-06 DIAGNOSIS — E118 Type 2 diabetes mellitus with unspecified complications: Secondary | ICD-10-CM

## 2013-10-06 DIAGNOSIS — E1165 Type 2 diabetes mellitus with hyperglycemia: Secondary | ICD-10-CM

## 2013-10-06 DIAGNOSIS — IMO0002 Reserved for concepts with insufficient information to code with codable children: Secondary | ICD-10-CM

## 2013-10-06 DIAGNOSIS — Z713 Dietary counseling and surveillance: Secondary | ICD-10-CM | POA: Insufficient documentation

## 2013-10-06 NOTE — Patient Instructions (Signed)
Goals:  Follow Diabetes Meal Plan as instructed  Eat 3 meals and 2 snacks, every 3-5 hrs  Limit carbohydrate intake to 30-45 grams carbohydrate/meal (1 cup of pasta)  Limit carbohydrate intake to 15 grams carbohydrate/snack  Add lean protein foods to meals/snacks  Consider coming to Diabetes Support group the second Monday of each month from 6-7 PM  Consider roasting cauliflower on 400 degrees on cookie sheet for about 20-25 minutes (until pieces are browned).   Aim to start exercising, with a goal of 30 minutes 5 x day. (7 Minute Workout)  If your glucose is high, drink plenty of sugar free fluids and try to do some exercise  For breakfast, try having just eggs and toast instead of breakfast meats

## 2013-10-06 NOTE — Progress Notes (Signed)
Appt start time: 0300 end time:  0330.   Assessment:  Brittany Evans is here today for a follow up. Gained 6 lbs since her last visit. States she had another Hgb A1c and states the result was still around  7.4%. Stating that her sugar has been spiking (around 270 mg/dl) when she has "something she's not supposed to". In the morning her numbers are around 120-160 mg/dl (most of the time it is below 130 mg/dl). Overall feels like blood sugar is going down.   Trying to walk more.   Wt Readings from Last 3 Encounters:  10/06/13 227 lb (102.967 kg)  09/29/13 225 lb 6.4 oz (102.241 kg)  07/05/13 221 lb 8 oz (100.472 kg)   Ht Readings from Last 3 Encounters:  10/06/13 5\' 4"  (1.626 m)  09/29/13 5\' 4"  (1.626 m)  07/05/13 5\' 4"  (1.626 m)   Body mass index is 38.95 kg/(m^2). @BMIFA @ Normalized weight-for-age data available only for age 42 to 65 years. Normalized stature-for-age data available only for age 42 to 24 years.  Current HbA1c: 7.4% on 10/28 (about the same a few weeks ago)  MEDICATIONS: see list   DIETARY INTAKE:  24-hr recall:  B ( AM): boiled egg, [place] bacon or sausage patty, toast, and applesauce with water and sometimes half a cup of coffee  Snk ( AM): peanut butter crackers or fruit with water   L ( PM): meat and veggies or peanut butter crackers, leftovers  Snk ( PM): fruit or peanut butter crackers (no salt crackers) D ( PM): baked pork chop and mixed greens with water  Snk ( PM): peanut butter crackers or fruit Beverages: mostly water with cinnamon stick  Usual physical activity: trying to exercise, but having trouble getting outside when it's cold  Estimated energy needs: 1600 calories 180 g carbohydrates 120 g protein 44 g fat  Progress Towards Goal(s):  Some progress.   Nutritional Diagnosis:  [place]-2.1 Impaired nutrition utilization As related to glucose metabolism.  As evidenced by Hgb A1c 14% and doctor recommendation for DSME.    Intervention:  Nutrition  counseling provided. Encouraged Brittany Evans to start incorporating some exercise and continue limiting carbohydrates at meal and snack times.    Barriers to learning/adherance to lifestyle change: none  Diabetes self-care support plan:   [facility] support group  Siblings and other family  Monitoring/Evaluation:  Dietary intake, exercise, and body weight in 6 weeks.

## 2013-11-03 ENCOUNTER — Other Ambulatory Visit: Payer: Self-pay | Admitting: Internal Medicine

## 2013-11-03 DIAGNOSIS — Z1231 Encounter for screening mammogram for malignant neoplasm of breast: Secondary | ICD-10-CM

## 2013-11-17 ENCOUNTER — Ambulatory Visit: Payer: No Typology Code available for payment source | Admitting: Dietician

## 2013-11-21 ENCOUNTER — Other Ambulatory Visit: Payer: Self-pay | Admitting: Emergency Medicine

## 2013-11-21 MED ORDER — ALBUTEROL SULFATE HFA 108 (90 BASE) MCG/ACT IN AERS: 2.0000 | INHALATION_SPRAY | Freq: Four times a day (QID) | RESPIRATORY_TRACT | Status: DC | PRN

## 2013-11-25 ENCOUNTER — Ambulatory Visit (HOSPITAL_COMMUNITY)
Admission: RE | Admit: 2013-11-25 | Discharge: 2013-11-25 | Disposition: A | Discharge status: Home / Self Care | Payer: No Typology Code available for payment source | Source: Ambulatory Visit | Attending: Internal Medicine | Admitting: Internal Medicine

## 2013-11-25 DIAGNOSIS — Z1231 Encounter for screening mammogram for malignant neoplasm of breast: Secondary | ICD-10-CM | POA: Insufficient documentation

## 2013-12-10 ENCOUNTER — Emergency Department (HOSPITAL_COMMUNITY)
Admission: EM | Admit: 2013-12-10 | Discharge: 2013-12-10 | Disposition: A | Discharge status: Home / Self Care | Payer: No Typology Code available for payment source | Source: Home / Self Care | Attending: Family Medicine | Admitting: Family Medicine

## 2013-12-10 ENCOUNTER — Inpatient Hospital Stay (HOSPITAL_COMMUNITY)
Admission: AD | Admit: 2013-12-10 | Discharge: 2013-12-10 | Disposition: A | Discharge status: Home / Self Care | Payer: Medicare HMO | Source: Ambulatory Visit | Attending: Obstetrics & Gynecology | Admitting: Obstetrics & Gynecology

## 2013-12-10 ENCOUNTER — Encounter (HOSPITAL_COMMUNITY): Payer: Self-pay | Admitting: *Deleted

## 2013-12-10 ENCOUNTER — Encounter (HOSPITAL_COMMUNITY): Payer: Self-pay | Admitting: Emergency Medicine

## 2013-12-10 ENCOUNTER — Inpatient Hospital Stay (HOSPITAL_COMMUNITY): Payer: Medicare HMO

## 2013-12-10 DIAGNOSIS — N949 Unspecified condition associated with female genital organs and menstrual cycle: Secondary | ICD-10-CM

## 2013-12-10 DIAGNOSIS — E119 Type 2 diabetes mellitus without complications: Secondary | ICD-10-CM | POA: Insufficient documentation

## 2013-12-10 DIAGNOSIS — I5020 Unspecified systolic (congestive) heart failure: Secondary | ICD-10-CM | POA: Insufficient documentation

## 2013-12-10 DIAGNOSIS — R109 Unspecified abdominal pain: Secondary | ICD-10-CM | POA: Insufficient documentation

## 2013-12-10 DIAGNOSIS — I2510 Atherosclerotic heart disease of native coronary artery without angina pectoris: Secondary | ICD-10-CM | POA: Insufficient documentation

## 2013-12-10 DIAGNOSIS — N938 Other specified abnormal uterine and vaginal bleeding: Secondary | ICD-10-CM

## 2013-12-10 DIAGNOSIS — I10 Essential (primary) hypertension: Secondary | ICD-10-CM | POA: Insufficient documentation

## 2013-12-10 DIAGNOSIS — N950 Postmenopausal bleeding: Secondary | ICD-10-CM | POA: Insufficient documentation

## 2013-12-10 DIAGNOSIS — E785 Hyperlipidemia, unspecified: Secondary | ICD-10-CM | POA: Insufficient documentation

## 2013-12-10 DIAGNOSIS — I509 Heart failure, unspecified: Secondary | ICD-10-CM | POA: Insufficient documentation

## 2013-12-10 DIAGNOSIS — J45909 Unspecified asthma, uncomplicated: Secondary | ICD-10-CM | POA: Insufficient documentation

## 2013-12-10 DIAGNOSIS — Z780 Asymptomatic menopausal state: Secondary | ICD-10-CM

## 2013-12-10 DIAGNOSIS — N925 Other specified irregular menstruation: Secondary | ICD-10-CM

## 2013-12-10 DIAGNOSIS — I428 Other cardiomyopathies: Secondary | ICD-10-CM | POA: Insufficient documentation

## 2013-12-10 DIAGNOSIS — D251 Intramural leiomyoma of uterus: Secondary | ICD-10-CM | POA: Insufficient documentation

## 2013-12-10 LAB — POCT URINALYSIS DIP (DEVICE)
Bilirubin Urine: NEGATIVE
Glucose, UA: NEGATIVE mg/dL
HGB URINE DIPSTICK: NEGATIVE
KETONES UR: NEGATIVE mg/dL
Leukocytes, UA: NEGATIVE
Nitrite: NEGATIVE
PROTEIN: NEGATIVE mg/dL
SPECIFIC GRAVITY, URINE: 1.01 (ref 1.005–1.030)
UROBILINOGEN UA: 0.2 mg/dL (ref 0.0–1.0)
pH: 5.5 (ref 5.0–8.0)

## 2013-12-10 LAB — WET PREP, GENITAL
Clue Cells Wet Prep HPF POC: NONE SEEN
Trich, Wet Prep: NONE SEEN
Yeast Wet Prep HPF POC: NONE SEEN

## 2013-12-10 NOTE — MAU Note (Signed)
Patient presents with complaint of abdominal pain X 1 week.

## 2013-12-10 NOTE — Discharge Instructions (Signed)
Go to [facility] for further check of pain and bleeding issues.

## 2013-12-10 NOTE — Discharge Instructions (Signed)

## 2013-12-10 NOTE — ED Provider Notes (Signed)
CSN: [id number]     Arrival date & time 12/10/13  1346 History   First MD Initiated Contact with Patient 12/10/13 1539     Chief Complaint  Patient presents with  . Abdominal Pain   (Consider location/radiation/quality/duration/timing/severity/associated sxs/prior Treatment) Patient is a 58 y.o. female presenting with abdominal pain. The history is provided by the patient.  Abdominal Pain Pain location:  L flank and LLQ Pain quality: aching and cramping   Pain severity:  Moderate Onset quality:  Gradual Duration:  1 week Progression:  Waxing and waning Chronicity:  New Context comment:  Lmp 2005, has been spotting x1 yr. Relieved by:  None tried Worsened by:  Nothing tried Ineffective treatments:  None tried Associated symptoms: vaginal bleeding   Associated symptoms: no constipation, no diarrhea, no dysuria, no fever, no hematuria, no nausea and no vomiting     Past Medical History  Diagnosis Date  . Systolic CHF   . Diabetes mellitus   . Hypertension   . Asthma   . NICM (nonischemic cardiomyopathy)     EF 20-25%, 45% 2012  . CAD (coronary artery disease)     NSTEMI 2/12: LAD 30%, D1 40%, oCFX 70-80%, mRCA 10-20%, ?occl. of apical LAD; treated medically  . Hyperlipidemia   . Obesity    Past Surgical History  Procedure Laterality Date  . None    . No past surgeries     Family History  Problem Relation Age of Onset  . Cancer Mother   . Diabetes Mother   . Coronary artery disease Mother   . Heart disease Mother   . Cirrhosis Father     Alcohol abuse  . Alcohol abuse Father   . Heart disease Father    History  Substance Use Topics  . Smoking status: Never Smoker   . Smokeless tobacco: Never Used  . Alcohol Use: No   OB History   Grav Para Term Preterm Abortions TAB SAB Ect Mult Living   5 2 1 1 3 3    2      Review of Systems  Constitutional: Negative.  Negative for fever.  Respiratory: Negative.   Cardiovascular: Negative.   Gastrointestinal: Positive  for abdominal pain. Negative for nausea, vomiting, diarrhea and constipation.  Genitourinary: Positive for vaginal bleeding, menstrual problem and pelvic pain. Negative for dysuria, urgency, frequency, hematuria and flank pain.    Allergies  Review of patient's allergies indicates no known allergies.  Home Medications   Prior to Admission medications   Medication Sig Start Date End Date Taking? Authorizing Provider  albuterol (PROAIR HFA) 108 (90 BASE) MCG/ACT inhaler Inhale 2 puffs into the lungs every 6 (six) hours as needed. Shortness of breath 11/21/13   Angelica Chessman, MD  aspirin 81 MG tablet Take 1 tablet (81 mg total) by mouth daily. 04/29/13   Robbie Lis, MD  atorvastatin (LIPITOR) 40 MG tablet TAKE 2 TABLETS BY MOUTH ONCE DAILY 09/19/13   Robbie Lis, MD  hydrALAZINE (APRESOLINE) 100 MG tablet Take 1 tablet (100 mg total) by mouth 3 (three) times daily. 09/29/13   Angelica Chessman, MD  insulin NPH-regular (RELION 70/30) (70-30) 100 UNIT/ML injection Inject 15 Units into the skin 2 (two) times daily with a meal. 04/29/13   Robbie Lis, MD  Insulin Syringe-Needle U-100 (INSULIN SYRINGE .3CC/31GX5/16") 31G X 5/16" 0.3 ML MISC 1 Syringe by Does not apply route as directed. 04/29/13   Robbie Lis, MD  Multiple Vitamin (MULTI-VITAMINS) TABS TAKE 1 TABLET  BY MOUTH ONCE DAILY 09/06/13   Robbie Lis, MD  nitroGLYCERIN (NITROSTAT) 0.4 MG SL tablet Place 0.4 mg under the tongue every 5 (five) minutes as needed. Chest pain    Historical Provider, MD  spironolactone (ALDACTONE) 25 MG tablet Take 1 tablet (25 mg total) by mouth daily. 09/30/13   Angelica Chessman, MD   BP 183/102  Pulse 56  Temp(Src) 98.4 F (36.9 C) (Oral)  Resp 17  SpO2 97% Physical Exam  Nursing note and vitals reviewed. Constitutional: She is oriented to person, place, and time. She appears well-developed and well-nourished.  Neck: Normal range of motion. Neck supple.  Cardiovascular: Normal heart sounds.    Pulmonary/Chest: Breath sounds normal.  Abdominal: Soft. Bowel sounds are normal. She exhibits no distension and no mass. There is no tenderness. There is no rebound and no guarding.  Neurological: She is alert and oriented to person, place, and time.  Skin: Skin is warm and dry.    ED Course  Procedures (including critical care time) Labs Review Labs Reviewed  POCT URINALYSIS DIP (DEVICE)    Imaging Review No results found.   MDM   1. Post-menopausal bleeding        Billy Fischer, MD 12/10/13 2003

## 2013-12-10 NOTE — MAU Provider Note (Signed)
History     CSN: [id number]  Arrival date and time: 12/10/13 1710   First Provider Initiated Contact with Patient 12/10/13 1821      Chief Complaint  Patient presents with  . Abdominal Pain   HPI  58 yo G5 P1132 presents with c/o abd pain and bleeding. Her pain is mid abd, radiates up L side and up her back.She has had the pain off/on x 1 wk, is achey most of the time, occ sharp stabbing. It starts after eating. She started menopause '04- last bleeding was '05, hx fibroids. She has had vaginal bleeding x 1 yr off/on- is bright red, small amt , no clots or pain. It will start after lifting something. Same partner x 1 1/2 yr, no STD screen yet. Last PAP ~ 20 yr ago.   OB History   Grav Para Term Preterm Abortions TAB SAB Ect Mult Living   5 2 1 1 3 3    2       Past Medical History  Diagnosis Date  . Systolic CHF   . Diabetes mellitus   . Hypertension   . Asthma   . NICM (nonischemic cardiomyopathy)     EF 20-25%, 45% 2012  . CAD (coronary artery disease)     NSTEMI 2/12: LAD 30%, D1 40%, oCFX 70-80%, mRCA 10-20%, ?occl. of apical LAD; treated medically  . Hyperlipidemia   . Obesity     Past Surgical History  Procedure Laterality Date  . None    . No past surgeries      Family History  Problem Relation Age of Onset  . Cancer Mother   . Diabetes Mother   . Coronary artery disease Mother   . Heart disease Mother   . Cirrhosis Father     Alcohol abuse  . Alcohol abuse Father   . Heart disease Father     History  Substance Use Topics  . Smoking status: Never Smoker   . Smokeless tobacco: Never Used  . Alcohol Use: No    Allergies: No Known Allergies  Prescriptions prior to admission  Medication Sig Dispense Refill  . albuterol (PROAIR HFA) 108 (90 BASE) MCG/ACT inhaler Inhale 2 puffs into the lungs every 6 (six) hours as needed. Shortness of breath  1 Inhaler  3  . aspirin 81 MG tablet Take 1 tablet (81 mg total) by mouth daily.  30 tablet  3  .  atorvastatin (LIPITOR) 40 MG tablet TAKE 2 TABLETS BY MOUTH ONCE DAILY  60 tablet  3  . hydrALAZINE (APRESOLINE) 100 MG tablet Take 1 tablet (100 mg total) by mouth 3 (three) times daily.  90 tablet  3  . insulin NPH-regular (RELION 70/30) (70-30) 100 UNIT/ML injection Inject 15 Units into the skin 2 (two) times daily with a meal.  10 mL  5  . Multiple Vitamin (MULTI-VITAMINS) TABS TAKE 1 TABLET BY MOUTH ONCE DAILY  30 tablet  3  . sodium chloride (OCEAN) 0.65 % SOLN nasal spray Place 1 spray into both nostrils at bedtime.      Marland Kitchen spironolactone (ALDACTONE) 25 MG tablet Take 1 tablet (25 mg total) by mouth daily.  30 tablet  3  . Insulin Syringe-Needle U-100 (INSULIN SYRINGE .3CC/31GX5/16") 31G X 5/16" 0.3 ML MISC 1 Syringe by Does not apply route as directed.  100 each  5  . nitroGLYCERIN (NITROSTAT) 0.4 MG SL tablet Place 0.4 mg under the tongue every 5 (five) minutes as needed. Chest pain  Review of Systems  Constitutional: Negative for fever and chills.  Gastrointestinal: Positive for abdominal pain. Negative for heartburn, diarrhea and constipation.  Genitourinary: Negative for dysuria, urgency and frequency.       + Vaginal bleeding   Physical Exam   Blood pressure 168/71, pulse 45, temperature 98.1 F (36.7 C), temperature source Oral, resp. rate 16, height 5\' 4"  (1.626 m), weight 227 lb (102.967 kg).  Physical Exam  Constitutional: She is oriented to person, place, and time. She appears well-developed and well-nourished.  GI: Soft. There is no tenderness.  Genitourinary:  Pelvic exam- Ext gen- nl anatomy, skin intact Vagina-mucoid clear to red discharge Cx- parous Ut unable to palpate- body habitus Adn- non tender, no masses palp  Musculoskeletal: Normal range of motion.  Neurological: She is alert and oriented to person, place, and time.  Skin: Skin is warm and dry.  Psychiatric: She has a normal mood and affect. Her behavior is normal.    MAU Course   Procedures  MDM US Transvaginal Non-ob  12/10/2013   CLINICAL DATA:  Postmenopausal bleeding  EXAM: TRANSABDOMINAL AND TRANSVAGINAL ULTRASOUND OF PELVIS  TECHNIQUE: Both transabdominal and transvaginal ultrasound examinations of the pelvis were performed. Transabdominal technique was performed for global imaging of the pelvis including uterus, ovaries, adnexal regions, and pelvic cul-de-sac. It was necessary to proceed with endovaginal exam following the transabdominal exam to visualize the uterus and endometrium.  COMPARISON:  None  FINDINGS: Uterus  Measurements: Normal in size at 11 cm x 7.1 cm x 7.6 cm. There several leiomyomas within the myometrium the largest measuring 3.8 cm in the right aspect of the uterine body. Two additional leiomyomas measuring 3.1 and 2.3 cm in the posterior wall and lower uterine segment.  Endometrium  Thickness: 8 mm. This is abnormally thickened for postmenopausal female. No focal lesion.  Right ovary  Measurements: 2.3 x 1.3 x 1.3 cm. Normal ovary  Left ovary  Measurements: 2.3 x 1.4 x 2.0 cm. Normal ovary  Other findings  No free fluid.  IMPRESSION: 1. Endometrium is abnormally thickened at 8 mm. In patient with postmenopausal bleeding, recommend endometrial biopsy. 2. Several intramural leiomyomas. Findings conveyed Zack Seal, NP on 12/10/2013  at20:08.   Electronically Signed   By: Suzy Bouchard M.D.   On: 12/10/2013 20:11   US Pelvis Complete  12/10/2013   CLINICAL DATA:  Postmenopausal bleeding  EXAM: TRANSABDOMINAL AND TRANSVAGINAL ULTRASOUND OF PELVIS  TECHNIQUE: Both transabdominal and transvaginal ultrasound examinations of the pelvis were performed. Transabdominal technique was performed for global imaging of the pelvis including uterus, ovaries, adnexal regions, and pelvic cul-de-sac. It was necessary to proceed with endovaginal exam following the transabdominal exam to visualize the uterus and endometrium.  COMPARISON:  None  FINDINGS: Uterus   Measurements: Normal in size at 11 cm x 7.1 cm x 7.6 cm. There several leiomyomas within the myometrium the largest measuring 3.8 cm in the right aspect of the uterine body. Two additional leiomyomas measuring 3.1 and 2.3 cm in the posterior wall and lower uterine segment.  Endometrium  Thickness: 8 mm. This is abnormally thickened for postmenopausal female. No focal lesion.  Right ovary  Measurements: 2.3 x 1.3 x 1.3 cm. Normal ovary  Left ovary  Measurements: 2.3 x 1.4 x 2.0 cm. Normal ovary  Other findings  No free fluid.  IMPRESSION: 1. Endometrium is abnormally thickened at 8 mm. In patient with postmenopausal bleeding, recommend endometrial biopsy. 2. Several intramural leiomyomas. Findings conveyed Zack Seal, NP on  12/10/2013  at20:08.   Electronically Signed   By: Suzy Bouchard M.D.   On: 12/10/2013 20:11      Results for orders placed during the hospital encounter of 12/10/13 (from the past 24 hour(s))  WET PREP, GENITAL     Status: Abnormal   Collection Time    12/10/13  6:22 PM      Result Value Ref Range   Yeast Wet Prep HPF POC NONE SEEN  NONE SEEN   Trich, Wet Prep NONE SEEN  NONE SEEN   Clue Cells Wet Prep HPF POC NONE SEEN  NONE SEEN   WBC, Wet Prep HPF POC FEW (*) NONE SEEN     Assessment and Plan  Dysfunctional uterine bleeding post menopausal  Refer to [facility] for further evaluation  Elesa Massed 12/10/2013, 6:26 PM

## 2013-12-10 NOTE — ED Notes (Signed)
Pt c/o intermittent abd pain onset 1 week; pain is 5/10 now Denies f/v/n/d, urinary sxs Reports she has been spotting Alert w/no signs of acute distress.

## 2013-12-11 NOTE — MAU Provider Note (Signed)
Attestation of Attending Supervision of Advanced Practitioner (PA/CNM/NP): Evaluation and management procedures were performed by the Advanced Practitioner under my supervision and collaboration.  I have reviewed the Advanced Practitioner's note and chart, and I agree with the management and plan.  Tayshawn Purnell, MD, FACOG Attending Obstetrician & Gynecologist Faculty Practice, [facility]  

## 2013-12-13 ENCOUNTER — Encounter: Payer: Self-pay | Admitting: Obstetrics & Gynecology

## 2013-12-13 LAB — GC/CHLAMYDIA PROBE AMP
CT Probe RNA: NEGATIVE
GC Probe RNA: NEGATIVE

## 2013-12-21 ENCOUNTER — Other Ambulatory Visit: Payer: Self-pay | Admitting: Internal Medicine

## 2013-12-21 DIAGNOSIS — I5022 Chronic systolic (congestive) heart failure: Secondary | ICD-10-CM

## 2013-12-30 ENCOUNTER — Other Ambulatory Visit: Payer: Self-pay | Admitting: *Deleted

## 2013-12-30 DIAGNOSIS — I10 Essential (primary) hypertension: Secondary | ICD-10-CM

## 2013-12-30 DIAGNOSIS — R079 Chest pain, unspecified: Secondary | ICD-10-CM

## 2013-12-30 DIAGNOSIS — I5022 Chronic systolic (congestive) heart failure: Secondary | ICD-10-CM

## 2013-12-30 MED ORDER — HYDRALAZINE HCL 100 MG PO TABS: 100.0000 mg | ORAL_TABLET | Freq: Three times a day (TID) | ORAL | Status: DC

## 2013-12-30 MED ORDER — ALBUTEROL SULFATE HFA 108 (90 BASE) MCG/ACT IN AERS: 2.0000 | INHALATION_SPRAY | Freq: Four times a day (QID) | RESPIRATORY_TRACT | Status: DC | PRN

## 2013-12-30 MED ORDER — ATORVASTATIN CALCIUM 40 MG PO TABS: 40.0000 mg | ORAL_TABLET | Freq: Every day | ORAL | Status: DC

## 2013-12-30 MED ORDER — NITROGLYCERIN 0.4 MG SL SUBL: 0.4000 mg | SUBLINGUAL_TABLET | SUBLINGUAL | Status: DC | PRN

## 2013-12-30 MED ORDER — SPIRONOLACTONE 25 MG PO TABS: 25.0000 mg | ORAL_TABLET | Freq: Every day | ORAL | Status: DC

## 2014-01-02 ENCOUNTER — Encounter: Payer: Self-pay | Admitting: Internal Medicine

## 2014-01-02 ENCOUNTER — Ambulatory Visit: Payer: Medicare HMO | Attending: Internal Medicine | Admitting: Internal Medicine

## 2014-01-02 VITALS — BP 150/83 | HR 65 | Temp 98.6°F | Resp 16 | Ht 64.0 in | Wt 223.0 lb

## 2014-01-02 DIAGNOSIS — Z1211 Encounter for screening for malignant neoplasm of colon: Secondary | ICD-10-CM

## 2014-01-02 DIAGNOSIS — E119 Type 2 diabetes mellitus without complications: Secondary | ICD-10-CM

## 2014-01-02 DIAGNOSIS — I10 Essential (primary) hypertension: Secondary | ICD-10-CM | POA: Insufficient documentation

## 2014-01-02 DIAGNOSIS — I2510 Atherosclerotic heart disease of native coronary artery without angina pectoris: Secondary | ICD-10-CM | POA: Insufficient documentation

## 2014-01-02 DIAGNOSIS — E785 Hyperlipidemia, unspecified: Secondary | ICD-10-CM | POA: Insufficient documentation

## 2014-01-02 DIAGNOSIS — I5020 Unspecified systolic (congestive) heart failure: Secondary | ICD-10-CM | POA: Insufficient documentation

## 2014-01-02 DIAGNOSIS — I428 Other cardiomyopathies: Secondary | ICD-10-CM | POA: Insufficient documentation

## 2014-01-02 DIAGNOSIS — I5022 Chronic systolic (congestive) heart failure: Secondary | ICD-10-CM

## 2014-01-02 DIAGNOSIS — I509 Heart failure, unspecified: Secondary | ICD-10-CM | POA: Insufficient documentation

## 2014-01-02 LAB — POCT GLYCOSYLATED HEMOGLOBIN (HGB A1C): HEMOGLOBIN A1C: 7.2

## 2014-01-02 LAB — GLUCOSE, POCT (MANUAL RESULT ENTRY): POC Glucose: 101 mg/dl — AB (ref 70–99)

## 2014-01-02 MED ORDER — CARVEDILOL 12.5 MG PO TABS: 12.5000 mg | ORAL_TABLET | Freq: Two times a day (BID) | ORAL | Status: DC

## 2014-01-02 MED ORDER — INSULIN NPH ISOPHANE & REGULAR (70-30) 100 UNIT/ML ~~LOC~~ SUSP: 15.0000 [IU] | Freq: Two times a day (BID) | SUBCUTANEOUS | Status: DC

## 2014-01-02 NOTE — Progress Notes (Signed)
Patient ID: Brittany Evans, female   DOB: 19-Mar-1956, 58 y.o.   MRN: [id number]   Brittany Evans, is a 58 y.o. female  AGT:[id number]  [id number]  DOB - 1955-12-19  Chief Complaint  Patient presents with  . Follow-up        Subjective:   Brittany Evans is a 58 y.o. female here today for a follow up visit. Patient has history of hypertension, diabetes mellitus, hyperlipidemia, morbid obesity, non-ischemic cardiomyopathy, with last left ventricular ejection fraction of 45% in 2012, she also has history of STEMI treated medically. She is here today for medication refills. She was seen in the ED for about 3 weeks ago for postmenopausal bleeding, she was investigated and found to have leiomyoma and thick endometrium. She has been scheduled for endometrial biopsy, appointment is coming up. She has no new complaint today, she said she is doing much better on her medications but she is on 50 mg of hydralazine 3 times a day instead of 100 mg. She does not smoke cigarette she does not drink alcohol. She has not had colonoscopy done. Patient has No headache, No chest pain, No abdominal pain - No Nausea, No new weakness tingling or numbness, No Cough - SOB.  Problem  Diabetes  Htn (Hypertension)  Colon Cancer Screening    ALLERGIES: No Known Allergies  PAST MEDICAL HISTORY: Past Medical History  Diagnosis Date  . Systolic CHF   . Diabetes mellitus   . Hypertension   . Asthma   . NICM (nonischemic cardiomyopathy)     EF 20-25%, 45% 2012  . CAD (coronary artery disease)     NSTEMI 2/12: LAD 30%, D1 40%, oCFX 70-80%, mRCA 10-20%, ?occl. of apical LAD; treated medically  . Hyperlipidemia   . Obesity     MEDICATIONS AT HOME: Prior to Admission medications   Medication Sig Start Date End Date Taking? Authorizing Provider  albuterol (PROAIR HFA) 108 (90 BASE) MCG/ACT inhaler Inhale 2 puffs into the lungs every 6 (six) hours as needed. Shortness of breath 12/30/13  Yes Angelica Chessman, MD  aspirin 81 MG tablet Take 1 tablet (81 mg total) by mouth daily. 04/29/13  Yes Robbie Lis, MD  atorvastatin (LIPITOR) 40 MG tablet Take 1 tablet (40 mg total) by mouth daily at 6 PM. 12/30/13  Yes Angelica Chessman, MD  hydrALAZINE (APRESOLINE) 100 MG tablet Take 1 tablet (100 mg total) by mouth 3 (three) times daily. 12/30/13  Yes Angelica Chessman, MD  insulin NPH-regular Human (RELION 70/30) (70-30) 100 UNIT/ML injection Inject 15 Units into the skin 2 (two) times daily with a meal. 01/02/14  Yes Angelica Chessman, MD  Insulin Syringe-Needle U-100 (INSULIN SYRINGE .3CC/31GX5/16") 31G X 5/16" 0.3 ML MISC 1 Syringe by Does not apply route as directed. 04/29/13  Yes Robbie Lis, MD  Multiple Vitamin (MULTI-VITAMINS) TABS TAKE 1 TABLET BY MOUTH ONCE DAILY 09/06/13  Yes Robbie Lis, MD  nitroGLYCERIN (NITROSTAT) 0.4 MG SL tablet Place 1 tablet (0.4 mg total) under the tongue every 5 (five) minutes as needed. Chest pain 12/30/13  Yes Angelica Chessman, MD  sodium chloride (OCEAN) 0.65 % SOLN nasal spray Place 1 spray into both nostrils at bedtime.   Yes Historical Provider, MD  spironolactone (ALDACTONE) 25 MG tablet Take 1 tablet (25 mg total) by mouth daily. 12/30/13  Yes Angelica Chessman, MD  carvedilol (COREG) 12.5 MG tablet Take 1 tablet (12.5 mg total) by mouth 2 (two) times daily with a meal. 01/02/14   Mcdonald Reiling  Doreene Burke, MD  HUMULIN N 100 UNIT/ML injection  11/21/13   Historical Provider, MD     Objective:   Filed Vitals:   01/02/14 1407  BP: 150/83  Pulse: 65  Temp: 98.6 F (37 C)  TempSrc: Oral  Resp: 16  Height: 5\' 4"  (1.626 m)  Weight: 223 lb (101.152 kg)  SpO2: 95%    Exam General appearance : Awake, alert, not in any distress. Speech Clear. Not toxic looking HEENT: Atraumatic and Normocephalic, pupils equally reactive to light and accomodation Neck: supple, no JVD. No cervical lymphadenopathy.  Chest:Good air entry bilaterally, no added sounds  CVS: S1 S2  regular, no murmurs.  Abdomen: Bowel sounds present, Non tender and not distended with no gaurding, rigidity or rebound. Extremities: B/L Lower Ext shows no edema, both legs are warm to touch Neurology: Awake alert, and oriented X 3, CN II-XII intact, Non focal Skin:No Rash Wounds:N/A  Data Review Lab Results  Component Value Date   HGBA1C 7.2 01/02/2014   HGBA1C 7.4 09/29/2013   HGBA1C 7.4 06/14/2013     Assessment & Plan   1. Diabetes  - Glucose (CBG) - HgB A1c is 7.2% today Refill - insulin NPH-regular Human (RELION 70/30) (70-30) 100 UNIT/ML injection; Inject 15 Units into the skin 2 (two) times daily with a meal.  Dispense: 3 vial; Refill: 3  2. Chronic systolic heart failure Refill - carvedilol (COREG) 12.5 MG tablet; Take 1 tablet (12.5 mg total) by mouth 2 (two) times daily with a meal.  Dispense: 180 tablet; Refill: 3  3. HTN (hypertension)  -Continue spironolactone 25 mg tablet by mouth daily  Continue hydralazine 50 mg tablet by mouth 3 times a day  4. Colon cancer screening  - [facility] COLONOSCOPY - Ambulatory referral to Gastroenterology  Patient was counseled extensively on nutrition and exercise  Return in about 3 months (around 04/04/2014), or if symptoms worsen or fail to improve, for Hemoglobin A1C and Follow up, DM, Follow up HTN.  The patient was given clear instructions to go to ER or return to medical center if symptoms don't improve, worsen or new problems develop. The patient verbalized understanding. The patient was told to call to get lab results if they haven't heard anything in the next week.   This note has been created with Surveyor, quantity. Any transcriptional errors are unintentional.    Angelica Chessman, MD, [place], [place], [place] and [facility] [place], [place]   01/02/2014, 2:56 PM

## 2014-01-02 NOTE — Progress Notes (Signed)
Pt is here following up on her diabetes. Pt states that she was in the ER 2 weeks ago with abdomen pain. Today she her abdomen is not painful but feels uneasy.

## 2014-01-02 NOTE — Patient Instructions (Signed)

## 2014-01-04 ENCOUNTER — Telehealth: Payer: Self-pay | Admitting: Internal Medicine

## 2014-01-04 ENCOUNTER — Other Ambulatory Visit: Payer: Self-pay | Admitting: Internal Medicine

## 2014-01-04 MED ORDER — SIMVASTATIN 40 MG PO TABS: 40.0000 mg | ORAL_TABLET | Freq: Every day | ORAL | Status: AC

## 2014-01-04 NOTE — Telephone Encounter (Signed)
Pt came in today and doctor Jegede straightened out her situation.

## 2014-01-04 NOTE — Telephone Encounter (Signed)
Pt has come in today to request explanation as to why her insulin was changed; please f/u with pt in the lobby

## 2014-01-04 NOTE — Addendum Note (Signed)
Addended by: Angelica Chessman E on: 01/04/2014 05:40 PM   Modules accepted: Orders, Medications

## 2014-01-05 ENCOUNTER — Other Ambulatory Visit: Payer: Self-pay | Admitting: Internal Medicine

## 2014-01-05 MED ORDER — INSULIN NPH (HUMAN) (ISOPHANE) 100 UNIT/ML ~~LOC~~ SUSP: 15.0000 [IU] | Freq: Two times a day (BID) | SUBCUTANEOUS | Status: DC

## 2014-01-06 NOTE — Telephone Encounter (Signed)
Pt came into the office and spoke with Dr. Doreene Burke and he resolved her situation

## 2014-01-19 ENCOUNTER — Other Ambulatory Visit (HOSPITAL_COMMUNITY)
Admission: RE | Admit: 2014-01-19 | Discharge: 2014-01-19 | Disposition: A | Discharge status: Home / Self Care | Payer: Medicare HMO | Source: Ambulatory Visit | Attending: Obstetrics & Gynecology | Admitting: Obstetrics & Gynecology

## 2014-01-19 ENCOUNTER — Ambulatory Visit (INDEPENDENT_AMBULATORY_CARE_PROVIDER_SITE_OTHER): Payer: No Typology Code available for payment source | Admitting: Obstetrics & Gynecology

## 2014-01-19 ENCOUNTER — Encounter: Payer: Self-pay | Admitting: Obstetrics & Gynecology

## 2014-01-19 VITALS — BP 144/93 | HR 67 | Ht 64.0 in | Wt 224.6 lb

## 2014-01-19 DIAGNOSIS — N950 Postmenopausal bleeding: Secondary | ICD-10-CM | POA: Insufficient documentation

## 2014-01-19 DIAGNOSIS — Z124 Encounter for screening for malignant neoplasm of cervix: Secondary | ICD-10-CM | POA: Insufficient documentation

## 2014-01-19 DIAGNOSIS — Z1151 Encounter for screening for human papillomavirus (HPV): Secondary | ICD-10-CM | POA: Insufficient documentation

## 2014-01-19 DIAGNOSIS — R6889 Other general symptoms and signs: Secondary | ICD-10-CM | POA: Insufficient documentation

## 2014-01-19 MED ORDER — MEGESTROL ACETATE 20 MG PO TABS: 20.0000 mg | ORAL_TABLET | Freq: Every day | ORAL | Status: DC

## 2014-01-19 NOTE — Progress Notes (Signed)
Patient ID: Brittany Evans, female   DOB: 30-Oct-1955, 58 y.o.   MRN: [id number]  Chief Complaint  Patient presents with  . Menorrhagia    HPI Brittany Evans is a 58 y.o. female.  [id number] No LMP recorded. Patient is postmenopausal. PMP bleeding on and off 2 years. Had previously no menses for 8 years   HPI  Past Medical History  Diagnosis Date  . Systolic CHF   . Diabetes mellitus   . Hypertension   . Asthma   . NICM (nonischemic cardiomyopathy)     EF 20-25%, 45% 2012  . CAD (coronary artery disease)     NSTEMI 2/12: LAD 30%, D1 40%, oCFX 70-80%, mRCA 10-20%, ?occl. of apical LAD; treated medically  . Hyperlipidemia   . Obesity     Past Surgical History  Procedure Laterality Date  . None    . No past surgeries      Family History  Problem Relation Age of Onset  . Cancer Mother   . Diabetes Mother   . Coronary artery disease Mother   . Heart disease Mother   . Cirrhosis Father     Alcohol abuse  . Alcohol abuse Father   . Heart disease Father     Social History History  Substance Use Topics  . Smoking status: Never Smoker   . Smokeless tobacco: Never Used  . Alcohol Use: No    No Known Allergies  Current Outpatient Prescriptions  Medication Sig Dispense Refill  . albuterol (PROAIR HFA) 108 (90 BASE) MCG/ACT inhaler Inhale 2 puffs into the lungs every 6 (six) hours as needed. Shortness of breath  1 Inhaler  3  . aspirin 81 MG tablet Take 1 tablet (81 mg total) by mouth daily.  30 tablet  3  . carvedilol (COREG) 12.5 MG tablet Take 1 tablet (12.5 mg total) by mouth 2 (two) times daily with a meal.  180 tablet  3  . hydrALAZINE (APRESOLINE) 100 MG tablet Take 1 tablet (100 mg total) by mouth 3 (three) times daily.  90 tablet  3  . insulin NPH Human (HUMULIN N) 100 UNIT/ML injection Inject 0.15 mLs (15 Units total) into the skin 2 (two) times daily before a meal.  3 vial  3  . Insulin Syringe-Needle U-100 (INSULIN SYRINGE .3CC/31GX5/16") 31G X 5/16" 0.3 ML  MISC 1 Syringe by Does not apply route as directed.  100 each  5  . Multiple Vitamin (MULTI-VITAMINS) TABS TAKE 1 TABLET BY MOUTH ONCE DAILY  30 tablet  3  . nitroGLYCERIN (NITROSTAT) 0.4 MG SL tablet Place 1 tablet (0.4 mg total) under the tongue every 5 (five) minutes as needed. Chest pain  10 tablet  0  . simvastatin (ZOCOR) 40 MG tablet Take 1 tablet (40 mg total) by mouth at bedtime.  90 tablet  3  . sodium chloride (OCEAN) 0.65 % SOLN nasal spray Place 1 spray into both nostrils at bedtime.      Marland Kitchen spironolactone (ALDACTONE) 25 MG tablet Take 1 tablet (25 mg total) by mouth daily.  30 tablet  0  . megestrol (MEGACE) 20 MG tablet Take 1 tablet (20 mg total) by mouth daily.  30 tablet  3   No current facility-administered medications for this visit.    Review of Systems Review of Systems  Constitutional: Negative.   Respiratory: Negative for shortness of breath.   Cardiovascular: Positive for chest pain (mild sharp).  Genitourinary: Positive for menstrual problem. Negative for vaginal discharge and pelvic pain.  Blood pressure 144/93, pulse 67, height 5\' 4"  (1.626 m), weight 101.878 kg (224 lb 9.6 oz).  Physical Exam Physical Exam  Constitutional: She appears well-developed. No distress.  Pulmonary/Chest: Effort normal. No respiratory distress.  Abdominal: Soft. She exhibits no mass.  Genitourinary: Vagina normal. No vaginal discharge found.  Skin: Skin is warm and dry.  Psychiatric: She has a normal mood and affect. Her behavior is normal.   Patient given informed consent, signed copy in the chart, time out was performed. Appropriate time out taken. . The patient was placed in the lithotomy position and the cervix brought into view with sterile speculum.  Portio of cervix cleansed x 2 with betadine swabs.  A tenaculum was placed in the anterior lip of the cervix.  The uterus was sounded for depth of 8 cm. A pipelle was introduced to into the uterus, suction created,  and an  endometrial sample was obtained. All equipment was removed and accounted for.  The patient tolerated the procedure well.    Patient given post procedure instructions. The patient will return in 2 weeks for results. Data Reviewed [place] result 8 mm endometrium, intramural fibroids  Assessment    PMP bleeding     Plan    RTC for Bx result. Megace 20 mg daily        Woodroe Mode 01/19/2014, 4:00 PM

## 2014-01-19 NOTE — Patient Instructions (Signed)

## 2014-01-23 LAB — CYTOLOGY - PAP

## 2014-02-10 ENCOUNTER — Ambulatory Visit (AMBULATORY_SURGERY_CENTER): Payer: Self-pay

## 2014-02-10 VITALS — Ht 64.0 in | Wt 220.0 lb

## 2014-02-10 DIAGNOSIS — Z1211 Encounter for screening for malignant neoplasm of colon: Secondary | ICD-10-CM

## 2014-02-10 MED ORDER — MOVIPREP 100 G PO SOLR: 1.0000 | Freq: Once | ORAL | Status: DC

## 2014-02-10 NOTE — Progress Notes (Signed)
No allergies to eggs or soy No home oxygen No diet/weight loss meds No past problems with anesthesia  Has email  Emmi instructions given for colonoscopy 

## 2014-02-22 ENCOUNTER — Other Ambulatory Visit: Payer: Self-pay | Admitting: Internal Medicine

## 2014-02-23 ENCOUNTER — Ambulatory Visit (AMBULATORY_SURGERY_CENTER): Payer: Medicare HMO | Admitting: Internal Medicine

## 2014-02-23 ENCOUNTER — Encounter: Payer: Self-pay | Admitting: Internal Medicine

## 2014-02-23 VITALS — BP 123/72 | HR 52 | Temp 98.7°F | Resp 24 | Ht 64.0 in | Wt 220.0 lb

## 2014-02-23 DIAGNOSIS — Z1211 Encounter for screening for malignant neoplasm of colon: Secondary | ICD-10-CM

## 2014-02-23 LAB — GLUCOSE, CAPILLARY
Glucose-Capillary: 108 mg/dL — ABNORMAL HIGH (ref 70–99)
Glucose-Capillary: 98 mg/dL (ref 70–99)

## 2014-02-23 MED ORDER — SODIUM CHLORIDE 0.9 % IV SOLN: 500.0000 mL | INTRAVENOUS | Status: DC

## 2014-02-23 NOTE — Op Note (Signed)
[place]  [address] [place], [id number]   COLONOSCOPY PROCEDURE REPORT  PATIENT: Brittany Evans, Brittany Evans  MR#: [id number] BIRTHDATE: 1955-11-04 , 69  yrs. old GENDER: Female ENDOSCOPIST: Jerene Bears, MD REFERRED [id number] Doreene Burke, M.D. PROCEDURE DATE:  02/23/2014 PROCEDURE:   Colonoscopy, screening First Screening Colonoscopy - Avg.  risk and is 50 yrs.  old or older Yes.  Prior Negative Screening - Now for repeat screening. N/A  History of Adenoma - Now for follow-up colonoscopy & has been > or = to 3 yrs.  N/A  Polyps Removed Today? No.  Recommend repeat exam, <10 yrs? No. ASA CLASS:   Class II INDICATIONS:average risk screening and first colonoscopy. MEDICATIONS: MAC sedation, administered by CRNA and propofol (Diprivan) 200mg  IV  DESCRIPTION OF PROCEDURE:   After the risks benefits and alternatives of the procedure were thoroughly explained, informed consent was obtained.  A digital rectal exam revealed no rectal mass.   The LB [id number]  endoscope was introduced through the anus and advanced to the cecum, which was identified by both the appendix and ileocecal valve. No adverse events experienced. The quality of the prep was good, using MoviPrep  The instrument was then slowly withdrawn as the colon was fully examined.   COLON FINDINGS: Moderate diverticulosis was noted in the descending colon and sigmoid colon.   The colon mucosa was otherwise normal. Retroflexed views revealed no abnormalities. The time to cecum=5 minutes 06 seconds.  Withdrawal time=9 minutes 11 seconds.  The scope was withdrawn and the procedure completed. COMPLICATIONS: There were no complications.  ENDOSCOPIC IMPRESSION: 1.   Moderate diverticulosis was noted in the descending colon and sigmoid colon 2.   The colon mucosa was otherwise normal  RECOMMENDATIONS: 1.  High fiber diet 2.  You should continue to follow colorectal cancer screening guidelines for "routine  risk" patients with a repeat colonoscopy in 10 years.  There is no need for FOBT (stool) testing for at least 5 years.   eSigned:  Jerene Bears, MD 02/23/2014 10:40 AM   cc: The Patient and Angelica Chessman MD

## 2014-02-23 NOTE — Progress Notes (Signed)
A/ox3 pleased with MAC, report to RN 

## 2014-02-23 NOTE — Patient Instructions (Signed)
YOU HAD AN ENDOSCOPIC PROCEDURE TODAY AT [facility]: Refer to the procedure report that was given to you for any specific questions about what was found during the examination.  If the procedure report does not answer your questions, please call your gastroenterologist to clarify.  If you requested that your care partner not be given the details of your procedure findings, then the procedure report has been included in a sealed envelope for you to review at your convenience later.  YOU SHOULD EXPECT: Some feelings of bloating in the abdomen. Passage of more gas than usual.  Walking can help get rid of the air that was put into your GI tract during the procedure and reduce the bloating. If you had a lower endoscopy (such as a colonoscopy or flexible sigmoidoscopy) you may notice spotting of blood in your stool or on the toilet paper. If you underwent a bowel prep for your procedure, then you may not have a normal bowel movement for a few days.  DIET: Your first meal following the procedure should be a light meal and then it is ok to progress to your normal diet.  A half-sandwich or bowl of soup is an example of a good first meal.  Heavy or fried foods are harder to digest and may make you feel nauseous or bloated.  Likewise meals heavy in dairy and vegetables can cause extra gas to form and this can also increase the bloating.  Drink plenty of fluids but you should avoid alcoholic beverages for 24 hours. Try to increase the fiber in your diet.  ACTIVITY: Your care partner should take you home directly after the procedure.  You should plan to take it easy, moving slowly for the rest of the day.  You can resume normal activity the day after the procedure however you should NOT DRIVE or use heavy machinery for 24 hours (because of the sedation medicines used during the test).    SYMPTOMS TO REPORT IMMEDIATELY: A gastroenterologist can be reached at any hour.  During normal business hours, 8:30  AM to 5:00 PM Monday through Friday, call [phone].  After hours and on weekends, please call the GI answering service at [phone] who will take a message and have the physician on call contact you.   Following lower endoscopy (colonoscopy or flexible sigmoidoscopy):  Excessive amounts of blood in the stool  Significant tenderness or worsening of abdominal pains  Swelling of the abdomen that is new, acute  Fever of 100F or higher  FOLLOW UP: If any biopsies were taken you will be contacted by phone or by letter within the next 1-3 weeks.  Call your gastroenterologist if you have not heard about the biopsies in 3 weeks.  Our staff will call the home number listed on your records the next business day following your procedure to check on you and address any questions or concerns that you may have at that time regarding the information given to you following your procedure. This is a courtesy call and so if there is no answer at the home number and we have not heard from you through the emergency physician on call, we will assume that you have returned to your regular daily activities without incident.  SIGNATURES/CONFIDENTIALITY: You and/or your care partner have signed paperwork which will be entered into your electronic medical record.  These signatures attest to the fact that that the information above on your After Visit Summary has been reviewed and is understood.  Full responsibility of the confidentiality of this discharge information lies with you and/or your care-partner.  Read handouts given to you by your recovery room nurse.

## 2014-02-24 ENCOUNTER — Telehealth: Payer: Self-pay

## 2014-02-24 NOTE — Telephone Encounter (Signed)
  Follow up Call-  Call back number 02/23/2014  Post procedure Call Back phone  # [phone]  Permission to leave phone message Yes     Patient questions:  Do you have a fever, pain , or abdominal swelling? No. Pain Score  0 *  Have you tolerated food without any problems? Yes.    Have you been able to return to your normal activities? Yes.    Do you have any questions about your discharge instructions: Diet   No. Medications  No. Follow up visit  No.  Do you have questions or concerns about your Care? No.  Actions: * If pain score is 4 or above: No action needed, pain <4.

## 2014-03-02 NOTE — Telephone Encounter (Signed)
Close Encounter 

## 2014-03-22 ENCOUNTER — Other Ambulatory Visit: Payer: Self-pay | Admitting: *Deleted

## 2014-03-22 DIAGNOSIS — N2889 Other specified disorders of kidney and ureter: Principal | ICD-10-CM

## 2014-03-22 DIAGNOSIS — I151 Hypertension secondary to other renal disorders: Secondary | ICD-10-CM

## 2014-03-22 MED ORDER — SPIRONOLACTONE 25 MG PO TABS: ORAL_TABLET | ORAL | Status: DC

## 2014-03-27 ENCOUNTER — Other Ambulatory Visit: Payer: Self-pay | Admitting: *Deleted

## 2014-03-27 DIAGNOSIS — R079 Chest pain, unspecified: Secondary | ICD-10-CM

## 2014-03-27 MED ORDER — NITROGLYCERIN 0.4 MG SL SUBL: 0.4000 mg | SUBLINGUAL_TABLET | SUBLINGUAL | Status: DC | PRN

## 2014-04-03 ENCOUNTER — Ambulatory Visit: Payer: Medicare HMO | Admitting: Internal Medicine

## 2014-04-11 ENCOUNTER — Encounter: Payer: Self-pay | Admitting: General Practice

## 2014-04-15 ENCOUNTER — Other Ambulatory Visit: Payer: Self-pay | Admitting: Obstetrics & Gynecology

## 2014-05-03 ENCOUNTER — Other Ambulatory Visit: Payer: Self-pay | Admitting: Internal Medicine

## 2014-05-08 ENCOUNTER — Other Ambulatory Visit: Payer: Self-pay | Admitting: Obstetrics & Gynecology

## 2014-05-22 ENCOUNTER — Ambulatory Visit (INDEPENDENT_AMBULATORY_CARE_PROVIDER_SITE_OTHER): Payer: Commercial Managed Care - HMO | Admitting: Obstetrics & Gynecology

## 2014-05-22 ENCOUNTER — Encounter: Payer: Self-pay | Admitting: Obstetrics & Gynecology

## 2014-05-22 VITALS — BP 143/78 | HR 86 | Ht 64.0 in | Wt 214.5 lb

## 2014-05-22 DIAGNOSIS — D259 Leiomyoma of uterus, unspecified: Secondary | ICD-10-CM

## 2014-05-22 DIAGNOSIS — D219 Benign neoplasm of connective and other soft tissue, unspecified: Secondary | ICD-10-CM | POA: Insufficient documentation

## 2014-05-22 DIAGNOSIS — N950 Postmenopausal bleeding: Secondary | ICD-10-CM

## 2014-05-22 NOTE — Progress Notes (Signed)
Patient ID: Brittany Evans, female DOB: April 16, 1956, 58 y.o. MRN: [id number]  Chief Complaint   Patient presents with   .  Menorrhagia   HPI  Brittany Evans is a 58 y.o. female. [id number]  No LMP recorded. Patient is postmenopausal. PMP bleeding on and off 2 years. Had previously no menses for 8 years [facility] for 2.5 months and is minimal now. [place] 11/2013 reviewed today HPI  Past Medical History   Diagnosis  Date   .  Systolic CHF    .  Diabetes mellitus    .  Hypertension    .  Asthma    .  NICM (nonischemic cardiomyopathy)      EF 20-25%, 45% 2012   .  CAD (coronary artery disease)      NSTEMI 2/12: LAD 30%, D1 40%, oCFX 70-80%, mRCA 10-20%, ?occl. of apical LAD; treated medically   .  Hyperlipidemia    .  Obesity     Past Surgical History   Procedure  Laterality  Date   .  None     .  No past surgeries      Family History   Problem  Relation  Age of Onset   .  Cancer  Mother    .  Diabetes  Mother    .  Coronary artery disease  Mother    .  Heart disease  Mother    .  Cirrhosis  Father      Alcohol abuse   .  Alcohol abuse  Father    .  Heart disease  Father    Social History  History   Substance Use Topics   .  Smoking status:  Never Smoker   .  Smokeless tobacco:  Never Used   .  Alcohol Use:  No   No Known Allergies  Current Outpatient Prescriptions   Medication  Sig  Dispense  Refill   .  albuterol (PROAIR HFA) 108 (90 BASE) MCG/ACT inhaler  Inhale 2 puffs into the lungs every 6 (six) hours as needed. Shortness of breath  1 Inhaler  3   .  aspirin 81 MG tablet  Take 1 tablet (81 mg total) by mouth daily.  30 tablet  3   .  carvedilol (COREG) 12.5 MG tablet  Take 1 tablet (12.5 mg total) by mouth 2 (two) times daily with a meal.  180 tablet  3   .  hydrALAZINE (APRESOLINE) 100 MG tablet  Take 1 tablet (100 mg total) by mouth 3 (three) times daily.  90 tablet  3   .  insulin NPH Human (HUMULIN N) 100 UNIT/ML injection  Inject 0.15 mLs (15 Units total) into the skin 2  (two) times daily before a meal.  3 vial  3   .  Insulin Syringe-Needle U-100 (INSULIN SYRINGE .3CC/31GX5/16") 31G X 5/16" 0.3 ML MISC  1 Syringe by Does not apply route as directed.  100 each  5   .  Multiple Vitamin (MULTI-VITAMINS) TABS  TAKE 1 TABLET BY MOUTH ONCE DAILY  30 tablet  3   .  nitroGLYCERIN (NITROSTAT) 0.4 MG SL tablet  Place 1 tablet (0.4 mg total) under the tongue every 5 (five) minutes as needed. Chest pain  10 tablet  0   .  simvastatin (ZOCOR) 40 MG tablet  Take 1 tablet (40 mg total) by mouth at bedtime.  90 tablet  3   .  sodium chloride (OCEAN) 0.65 % SOLN nasal spray  Place 1  spray into both nostrils at bedtime.     Marland Kitchen  spironolactone (ALDACTONE) 25 MG tablet  Take 1 tablet (25 mg total) by mouth daily.  30 tablet  0   .  megestrol (MEGACE) 20 MG tablet  Take 1 tablet (20 mg total) by mouth daily.  30 tablet  3    No current facility-administered medications for this visit.   Review of Systems  Review of Systems  Constitutional: Negative.  Respiratory: Negative for shortness of breath.  Cardiovascular: Positive for chest pain (mild sharp).  Genitourinary: Positive for menstrual problem. Negative for vaginal discharge and pelvic pain.  Filed Vitals:   05/22/14 1507  Height: 5\' 4"  (1.626 m)  Weight: 97.297 kg (214 lb 8 oz)    Physical Exam  Physical Exam  Constitutional: She appears well-developed. No distress.  Pulmonary/Chest: Effort normal. No respiratory distress.  Data Reviewed  [place] result 8 mm endometrium, intramural fibroids Benign biopsy result Assessment  PMP bleeding  Benign biopsy result Plan  Repeat US. Megace 20 mg daily RTC to review result and consider possible hysteroscopy  Woodroe Mode, MD 05/22/2014

## 2014-05-30 ENCOUNTER — Ambulatory Visit (HOSPITAL_COMMUNITY)
Admission: RE | Admit: 2014-05-30 | Discharge: 2014-05-30 | Disposition: A | Discharge status: Home / Self Care | Payer: Medicare HMO | Source: Ambulatory Visit | Attending: Obstetrics & Gynecology | Admitting: Obstetrics & Gynecology

## 2014-05-30 DIAGNOSIS — N950 Postmenopausal bleeding: Secondary | ICD-10-CM | POA: Insufficient documentation

## 2014-05-30 DIAGNOSIS — D259 Leiomyoma of uterus, unspecified: Secondary | ICD-10-CM

## 2014-05-30 DIAGNOSIS — D250 Submucous leiomyoma of uterus: Secondary | ICD-10-CM | POA: Diagnosis not present

## 2014-05-31 ENCOUNTER — Encounter: Payer: Self-pay | Admitting: *Deleted

## 2014-06-12 ENCOUNTER — Other Ambulatory Visit: Payer: Self-pay | Admitting: Obstetrics & Gynecology

## 2014-06-12 ENCOUNTER — Ambulatory Visit (INDEPENDENT_AMBULATORY_CARE_PROVIDER_SITE_OTHER): Payer: Commercial Managed Care - HMO | Admitting: Obstetrics & Gynecology

## 2014-06-12 ENCOUNTER — Encounter: Payer: Self-pay | Admitting: Obstetrics & Gynecology

## 2014-06-12 VITALS — BP 147/68 | HR 58 | Temp 98.9°F | Ht 64.0 in | Wt 213.8 lb

## 2014-06-12 DIAGNOSIS — D259 Leiomyoma of uterus, unspecified: Secondary | ICD-10-CM

## 2014-06-12 DIAGNOSIS — N950 Postmenopausal bleeding: Secondary | ICD-10-CM

## 2014-06-12 NOTE — Patient Instructions (Signed)
Hysteroscopy °Hysteroscopy is a procedure used for looking inside the womb (uterus). It may be done for various reasons, including: °· To evaluate abnormal bleeding, fibroid (benign, noncancerous) tumors, polyps, scar tissue (adhesions), and possibly cancer of the uterus. °· To look for lumps (tumors) and other uterine growths. °· To look for causes of why a woman cannot get pregnant (infertility), causes of recurrent loss of pregnancy (miscarriages), or a lost intrauterine device (IUD). °· To perform a sterilization by blocking the fallopian tubes from inside the uterus. °In this procedure, a thin, flexible tube with a tiny light and camera on the end of it (hysteroscope) is used to look inside the uterus. A hysteroscopy should be done right after a menstrual period to be sure you are not pregnant. °LET YOUR HEALTH CARE PROVIDER KNOW ABOUT:  °· Any allergies you have. °· All medicines you are taking, including vitamins, herbs, eye drops, creams, and over-the-counter medicines. °· Previous problems you or members of your family have had with the use of anesthetics. °· Any blood disorders you have. °· Previous surgeries you have had. °· Medical conditions you have. °RISKS AND COMPLICATIONS  °Generally, this is a safe procedure. However, as with any procedure, complications can occur. Possible complications include: °· Putting a hole in the uterus. °· Excessive bleeding. °· Infection. °· Damage to the cervix. °· Injury to other organs. °· Allergic reaction to medicines. °· Too much fluid used in the uterus for the procedure. °BEFORE THE PROCEDURE  °· Ask your health care provider about changing or stopping any regular medicines. °· Do not take aspirin or blood thinners for 1 week before the procedure, or as directed by your health care provider. These can cause bleeding. °· If you smoke, do not smoke for 2 weeks before the procedure. °· In some cases, a medicine is placed in the cervix the day before the procedure.  This medicine makes the cervix have a larger opening (dilate). This makes it easier for the instrument to be inserted into the uterus during the procedure. °· Do not eat or drink anything for at least 8 hours before the surgery. °· Arrange for someone to take you home after the procedure. °PROCEDURE  °· You may be given a medicine to relax you (sedative). You may also be given one of the following: °¨ A medicine that numbs the area around the cervix (local anesthetic). °¨ A medicine that makes you sleep through the procedure (general anesthetic). °· The hysteroscope is inserted through the vagina into the uterus. The camera on the hysteroscope sends a picture to a TV screen. This gives the surgeon a good view inside the uterus. °· During the procedure, air or a liquid is put into the uterus, which allows the surgeon to see better. °· Sometimes, tissue is gently scraped from inside the uterus. These tissue samples are sent to a lab for testing. °AFTER THE PROCEDURE  °· If you had a general anesthetic, you may be groggy for a couple hours after the procedure. °· If you had a local anesthetic, you will be able to go home as soon as you are stable and feel ready. °· You may have some cramping. This normally lasts for a couple days. °· You may have bleeding, which varies from light spotting for a few days to menstrual-like bleeding for 3-7 days. This is normal. °· If your test results are not back during the visit, make an appointment with your health care provider to find out the   results. Document Released: 11/10/2000 Document Revised: 05/25/2013 Document Reviewed: 03/03/2013 Exit[employer] Patient Information 2015 [employer]. This information is not intended to replace advice given to you by your health care provider. Make sure you discuss any questions you have with your health care provider.   

## 2014-06-12 NOTE — Progress Notes (Signed)
Patient ID: Brittany Evans, female   DOB: May 15, 1956, 58 y.o.   MRN: [id number]  Chief Complaint  Patient presents with  . Follow-up    HPI Brittany Evans is a 58 y.o. female.  [id number] No LMP recorded. Patient is postmenopausal. Only scant V with Megace. Repeat US was reviewed  HPI  Past Medical History  Diagnosis Date  . Systolic CHF   . Diabetes mellitus   . Hypertension   . Asthma   . NICM (nonischemic cardiomyopathy)     EF 20-25%, 45% 2012  . CAD (coronary artery disease)     NSTEMI 2/12: LAD 30%, D1 40%, oCFX 70-80%, mRCA 10-20%, ?occl. of apical LAD; treated medically  . Hyperlipidemia   . Obesity     Past Surgical History  Procedure Laterality Date  . None    . No past surgeries      Family History  Problem Relation Age of Onset  . Cancer Mother   . Diabetes Mother   . Coronary artery disease Mother   . Heart disease Mother   . Cirrhosis Father     Alcohol abuse  . Alcohol abuse Father   . Heart disease Father   . Pancreatic cancer Maternal Grandmother   . Colon cancer Neg Hx   . Rectal cancer Neg Hx   . Stomach cancer Neg Hx     Social History History  Substance Use Topics  . Smoking status: Never Smoker   . Smokeless tobacco: Never Used  . Alcohol Use: No    No Known Allergies  Current Outpatient Prescriptions  Medication Sig Dispense Refill  . albuterol (PROAIR HFA) 108 (90 BASE) MCG/ACT inhaler Inhale 2 puffs into the lungs every 6 (six) hours as needed. Shortness of breath  1 Inhaler  3  . aspirin 81 MG tablet Take 1 tablet (81 mg total) by mouth daily.  30 tablet  3  . carvedilol (COREG) 12.5 MG tablet Take 1 tablet (12.5 mg total) by mouth 2 (two) times daily with a meal.  180 tablet  3  . hydrALAZINE (APRESOLINE) 50 MG tablet Take 50 mg by mouth 3 (three) times daily.      . insulin NPH Human (HUMULIN N) 100 UNIT/ML injection Inject 0.15 mLs (15 Units total) into the skin 2 (two) times daily before a meal.  3 vial  3  . Insulin  Syringe-Needle U-100 (INSULIN SYRINGE .3CC/31GX5/16") 31G X 5/16" 0.3 ML MISC 1 Syringe by Does not apply route as directed.  100 each  5  . megestrol (MEGACE) 20 MG tablet TAKE 1 TABLET BY MOUTH EVERY DAY  30 tablet  0  . Multiple Vitamin (MULTI-VITAMINS) TABS TAKE 1 TABLET BY MOUTH ONCE DAILY  30 tablet  3  . nitroGLYCERIN (NITROSTAT) 0.4 MG SL tablet Place 1 tablet (0.4 mg total) under the tongue every 5 (five) minutes as needed. Chest pain  10 tablet  0  . simvastatin (ZOCOR) 40 MG tablet Take 1 tablet (40 mg total) by mouth at bedtime.  90 tablet  3  . sodium chloride (OCEAN) 0.65 % SOLN nasal spray Place 1 spray into both nostrils at bedtime.      Marland Kitchen spironolactone (ALDACTONE) 25 MG tablet TAKE 1 TABLET BY MOUTH DAILY  90 tablet  0   No current facility-administered medications for this visit.    Review of Systems Review of Systems  Constitutional: Negative.   Genitourinary: Positive for vaginal bleeding and menstrual problem. Negative for pelvic pain.  Blood pressure 147/68, pulse 58, temperature 98.9 F (37.2 C), temperature source Oral, height 5\' 4"  (1.626 m), weight 213 lb 12.8 oz (96.979 kg).  Physical Exam Physical Exam  Constitutional: She is oriented to person, place, and time. She appears well-developed. No distress.  Pulmonary/Chest: Effort normal.  Neurological: She is alert and oriented to person, place, and time.  Psychiatric: She has a normal mood and affect. Her behavior is normal.    Data Reviewed CLINICAL DATA: Uterine leiomyoma. Postmenopausal bleeding for 1  year.  EXAM:  TRANSABDOMINAL ULTRASOUND OF PELVIS  TECHNIQUE:  Transabdominal ultrasound examination of the pelvis was performed  including evaluation of the uterus, ovaries, adnexal regions, and  pelvic cul-de-sac.  COMPARISON: 12/10/2013  FINDINGS:  Uterus  Measurements: At least 12.5 x 8.3 x 9.2 cm and possibly larger.  Multiple fibroids are identified. The largest 3 are measured. In the   fundal region a fibroid is 4.4 x 3.8 x 3.7 cm. A left lower uterine  submucosal fibroid is 3.4 x 3.2 x 3.2 cm. A left mid uterine fibroid  is 4.3 x 4.1 x 3.7 cm.  Endometrium  Thickness: 21.6 mm. Heterogeneous with small cystic spaces. No  discrete mass identified however  Right ovary  Measurements: 2.7 x 1.7 x 1.7 cm. Normal appearance/no adnexal mass.  Left ovary  Measurements: 2.1 x 1.6 x 1.6 cm. Normal appearance/no adnexal mass.  Other findings: No free fluid  IMPRESSION:  1. Enlarged uterus containing multiple fibroids.  2. Thickened endometrial stripe. In the setting of post-menopausal  bleeding, endometrial sampling is indicated to exclude carcinoma. If  results are benign, sonohysterogram should be considered for focal  lesion work-up. (Ref: Radiological Reasoning: Algorithmic Workup of  Abnormal Vaginal Bleeding with Endovaginal Sonography and  Sonohysterography. AJR 2008; 030:S92-33).  Electronically Signed  By: Shon Hale M.D.  On: 05/30/2014 11:33   Assessment    PMP bleeding , fibroid uterus, had nl EMBx 01/2014     Plan    Offered hysteroscopy and resection of submucous fibroid, curettage.  The procedure and the risk of anesthesia, bleeding, infection, bowel and bladder injury,were discussed and her questions were answered. The procedure will be scheduled as an outpatient.         ARNOLD,JAMES 06/12/2014, 2:34 PM

## 2014-06-14 ENCOUNTER — Ambulatory Visit: Payer: Commercial Managed Care - HMO

## 2014-06-16 ENCOUNTER — Other Ambulatory Visit: Payer: Self-pay | Admitting: Obstetrics & Gynecology

## 2014-06-19 ENCOUNTER — Encounter: Payer: Self-pay | Admitting: Obstetrics & Gynecology

## 2014-07-11 ENCOUNTER — Other Ambulatory Visit: Payer: Self-pay | Admitting: Obstetrics & Gynecology

## 2014-07-17 ENCOUNTER — Encounter (HOSPITAL_COMMUNITY)
Admission: RE | Admit: 2014-07-17 | Discharge: 2014-07-17 | Disposition: A | Discharge status: Home / Self Care | Payer: Commercial Managed Care - HMO | Source: Ambulatory Visit | Attending: Obstetrics & Gynecology | Admitting: Obstetrics & Gynecology

## 2014-07-17 ENCOUNTER — Other Ambulatory Visit (HOSPITAL_COMMUNITY): Payer: Commercial Managed Care - HMO

## 2014-07-17 ENCOUNTER — Encounter (HOSPITAL_COMMUNITY): Payer: Self-pay

## 2014-07-17 DIAGNOSIS — I10 Essential (primary) hypertension: Secondary | ICD-10-CM | POA: Diagnosis not present

## 2014-07-17 DIAGNOSIS — I429 Cardiomyopathy, unspecified: Secondary | ICD-10-CM | POA: Diagnosis not present

## 2014-07-17 DIAGNOSIS — N840 Polyp of corpus uteri: Secondary | ICD-10-CM | POA: Diagnosis not present

## 2014-07-17 DIAGNOSIS — J45909 Unspecified asthma, uncomplicated: Secondary | ICD-10-CM | POA: Diagnosis not present

## 2014-07-17 DIAGNOSIS — Z7982 Long term (current) use of aspirin: Secondary | ICD-10-CM | POA: Diagnosis not present

## 2014-07-17 DIAGNOSIS — E785 Hyperlipidemia, unspecified: Secondary | ICD-10-CM | POA: Diagnosis not present

## 2014-07-17 DIAGNOSIS — E119 Type 2 diabetes mellitus without complications: Secondary | ICD-10-CM | POA: Diagnosis not present

## 2014-07-17 DIAGNOSIS — Z794 Long term (current) use of insulin: Secondary | ICD-10-CM | POA: Diagnosis not present

## 2014-07-17 DIAGNOSIS — I5020 Unspecified systolic (congestive) heart failure: Secondary | ICD-10-CM | POA: Diagnosis not present

## 2014-07-17 DIAGNOSIS — I2510 Atherosclerotic heart disease of native coronary artery without angina pectoris: Secondary | ICD-10-CM | POA: Diagnosis not present

## 2014-07-17 DIAGNOSIS — Z79899 Other long term (current) drug therapy: Secondary | ICD-10-CM | POA: Diagnosis not present

## 2014-07-17 DIAGNOSIS — N950 Postmenopausal bleeding: Secondary | ICD-10-CM | POA: Diagnosis present

## 2014-07-17 DIAGNOSIS — E669 Obesity, unspecified: Secondary | ICD-10-CM | POA: Diagnosis not present

## 2014-07-17 HISTORY — DX: Myoneural disorder, unspecified: G70.9

## 2014-07-17 HISTORY — DX: Chronic kidney disease, unspecified: N18.9

## 2014-07-17 LAB — BASIC METABOLIC PANEL
Anion gap: 12 (ref 5–15)
BUN: 17 mg/dL (ref 6–23)
CHLORIDE: 105 meq/L (ref 96–112)
CO2: 23 meq/L (ref 19–32)
Calcium: 9.6 mg/dL (ref 8.4–10.5)
Creatinine, Ser: 1.33 mg/dL — ABNORMAL HIGH (ref 0.50–1.10)
GFR calc Af Amer: 50 mL/min — ABNORMAL LOW (ref >90)
GFR calc non Af Amer: 43 mL/min — ABNORMAL LOW (ref >90)
Glucose, Bld: 91 mg/dL (ref 70–99)
Potassium: 4.2 mEq/L (ref 3.7–5.3)
Sodium: 140 mEq/L (ref 137–147)

## 2014-07-17 LAB — CBC
HCT: 39.1 % (ref 36.0–46.0)
Hemoglobin: 13.4 g/dL (ref 12.0–15.0)
MCH: 30.3 pg (ref 26.0–34.0)
MCHC: 34.3 g/dL (ref 30.0–36.0)
MCV: 88.5 fL (ref 78.0–100.0)
Platelets: 214 10*3/uL (ref 150–400)
RBC: 4.42 MIL/uL (ref 3.87–5.11)
RDW: 13.2 % (ref 11.5–15.5)
WBC: 5 10*3/uL (ref 4.0–10.5)

## 2014-07-17 NOTE — Patient Instructions (Addendum)
Your procedure is scheduled on:07/18/14  Enter through the Main Entrance at :1:30 pm Pick up desk phone and dial [phone] and inform us of your arrival.  Please call [phone] if you have any problems the morning of surgery.  Remember: Do not eat food after midnight:tonight Clear liquids are ok until:11am  You may brush your teeth the morning of surgery.  Take these meds the morning of surgery with a sip of water:all morning blood pressure pills and 5 units of insulin  DO NOT wear jewelry, eye make-up, lipstick,body lotion, or dark fingernail polish.  (Polished toes are ok) You may wear deodorant.  If you are to be admitted after surgery, leave suitcase in car until your room has been assigned. Patients discharged on the day of surgery will not be allowed to drive home. Wear loose fitting, comfortable clothes for your ride home.

## 2014-07-17 NOTE — Pre-Procedure Instructions (Signed)
Per Dr. Seward Speck pt to take normal dose of insulin the night before surgery and take 5 units insulin the morning of surgery.Pt instructed.

## 2014-07-18 ENCOUNTER — Ambulatory Visit (HOSPITAL_COMMUNITY): Payer: Commercial Managed Care - HMO | Admitting: Certified Registered Nurse Anesthetist

## 2014-07-18 ENCOUNTER — Ambulatory Visit (HOSPITAL_COMMUNITY)
Admission: RE | Admit: 2014-07-18 | Discharge: 2014-07-18 | Disposition: A | Discharge status: Home / Self Care | Payer: Commercial Managed Care - HMO | Source: Ambulatory Visit | Attending: Obstetrics & Gynecology | Admitting: Obstetrics & Gynecology

## 2014-07-18 ENCOUNTER — Encounter (HOSPITAL_COMMUNITY): Payer: Self-pay

## 2014-07-18 ENCOUNTER — Encounter (HOSPITAL_COMMUNITY)
Admission: RE | Disposition: A | Discharge status: Home / Self Care | Payer: Self-pay | Source: Ambulatory Visit | Attending: Obstetrics & Gynecology

## 2014-07-18 DIAGNOSIS — J45909 Unspecified asthma, uncomplicated: Secondary | ICD-10-CM | POA: Insufficient documentation

## 2014-07-18 DIAGNOSIS — E785 Hyperlipidemia, unspecified: Secondary | ICD-10-CM | POA: Insufficient documentation

## 2014-07-18 DIAGNOSIS — Z7982 Long term (current) use of aspirin: Secondary | ICD-10-CM | POA: Insufficient documentation

## 2014-07-18 DIAGNOSIS — I10 Essential (primary) hypertension: Secondary | ICD-10-CM | POA: Insufficient documentation

## 2014-07-18 DIAGNOSIS — I2510 Atherosclerotic heart disease of native coronary artery without angina pectoris: Secondary | ICD-10-CM | POA: Diagnosis not present

## 2014-07-18 DIAGNOSIS — E119 Type 2 diabetes mellitus without complications: Secondary | ICD-10-CM | POA: Insufficient documentation

## 2014-07-18 DIAGNOSIS — N950 Postmenopausal bleeding: Secondary | ICD-10-CM | POA: Insufficient documentation

## 2014-07-18 DIAGNOSIS — I5020 Unspecified systolic (congestive) heart failure: Secondary | ICD-10-CM | POA: Insufficient documentation

## 2014-07-18 DIAGNOSIS — N840 Polyp of corpus uteri: Secondary | ICD-10-CM | POA: Diagnosis not present

## 2014-07-18 DIAGNOSIS — I429 Cardiomyopathy, unspecified: Secondary | ICD-10-CM | POA: Insufficient documentation

## 2014-07-18 DIAGNOSIS — Z79899 Other long term (current) drug therapy: Secondary | ICD-10-CM | POA: Insufficient documentation

## 2014-07-18 DIAGNOSIS — E669 Obesity, unspecified: Secondary | ICD-10-CM | POA: Insufficient documentation

## 2014-07-18 DIAGNOSIS — Z794 Long term (current) use of insulin: Secondary | ICD-10-CM | POA: Insufficient documentation

## 2014-07-18 HISTORY — PX: DILATATION & CURETTAGE/HYSTEROSCOPY WITH MYOSURE: SHX6511

## 2014-07-18 HISTORY — PX: POLYPECTOMY: SHX5525

## 2014-07-18 LAB — GLUCOSE, CAPILLARY
Glucose-Capillary: 106 mg/dL — ABNORMAL HIGH (ref 70–99)
Glucose-Capillary: 79 mg/dL (ref 70–99)

## 2014-07-18 SURGERY — DILATATION & CURETTAGE/HYSTEROSCOPY WITH MYOSURE
Anesthesia: General | Site: Uterus

## 2014-07-18 MED ORDER — PROPOFOL 10 MG/ML IV BOLUS
INTRAVENOUS | Status: DC | PRN
  Administered 2014-07-18: 150 mg via INTRAVENOUS
  Administered 2014-07-18: 20 mg via INTRAVENOUS

## 2014-07-18 MED ORDER — EPHEDRINE 5 MG/ML INJ
INTRAVENOUS | Status: AC
  Filled 2014-07-18: qty 10

## 2014-07-18 MED ORDER — BUPIVACAINE HCL (PF) 0.5 % IJ SOLN
INTRAMUSCULAR | Status: AC
  Filled 2014-07-18: qty 30

## 2014-07-18 MED ORDER — SCOPOLAMINE 1 MG/3DAYS TD PT72
1.0000 | MEDICATED_PATCH | Freq: Once | TRANSDERMAL | Status: DC
  Administered 2014-07-18: 1.5 mg via TRANSDERMAL

## 2014-07-18 MED ORDER — ONDANSETRON HCL 4 MG/2ML IJ SOLN
INTRAMUSCULAR | Status: AC
Start: 2014-07-18 — End: 2014-07-18
  Filled 2014-07-18: qty 2

## 2014-07-18 MED ORDER — FENTANYL CITRATE 0.05 MG/ML IJ SOLN
INTRAMUSCULAR | Status: DC | PRN
  Administered 2014-07-18 (×2): 50 ug via INTRAVENOUS

## 2014-07-18 MED ORDER — LIDOCAINE HCL (CARDIAC) 20 MG/ML IV SOLN
INTRAVENOUS | Status: AC
  Filled 2014-07-18: qty 5

## 2014-07-18 MED ORDER — BUPIVACAINE HCL (PF) 0.5 % IJ SOLN
INTRAMUSCULAR | Status: DC | PRN
  Administered 2014-07-18: 9 mL

## 2014-07-18 MED ORDER — KETOROLAC TROMETHAMINE 30 MG/ML IJ SOLN
INTRAMUSCULAR | Status: AC
  Filled 2014-07-18: qty 1

## 2014-07-18 MED ORDER — LACTATED RINGERS IV SOLN
INTRAVENOUS | Status: DC
  Administered 2014-07-18: 12:00:00 via INTRAVENOUS

## 2014-07-18 MED ORDER — METOCLOPRAMIDE HCL 5 MG/ML IJ SOLN
10.0000 mg | Freq: Once | INTRAMUSCULAR | Status: DC | PRN
Start: 2014-07-18 — End: 2014-07-18

## 2014-07-18 MED ORDER — MIDAZOLAM HCL 2 MG/2ML IJ SOLN
INTRAMUSCULAR | Status: AC
  Filled 2014-07-18: qty 2

## 2014-07-18 MED ORDER — OXYCODONE-ACETAMINOPHEN 5-325 MG PO TABS: 1.0000 | ORAL_TABLET | ORAL | Status: DC | PRN

## 2014-07-18 MED ORDER — LACTATED RINGERS IV SOLN: INTRAVENOUS | Status: DC

## 2014-07-18 MED ORDER — PROPOFOL 10 MG/ML IV EMUL
INTRAVENOUS | Status: AC
  Filled 2014-07-18: qty 20

## 2014-07-18 MED ORDER — GLYCOPYRROLATE 0.2 MG/ML IJ SOLN
INTRAMUSCULAR | Status: AC
  Filled 2014-07-18: qty 1

## 2014-07-18 MED ORDER — SCOPOLAMINE 1 MG/3DAYS TD PT72
MEDICATED_PATCH | TRANSDERMAL | Status: AC
  Administered 2014-07-18: 1.5 mg via TRANSDERMAL
  Filled 2014-07-18: qty 1

## 2014-07-18 MED ORDER — ONDANSETRON HCL 4 MG/2ML IJ SOLN
INTRAMUSCULAR | Status: DC | PRN
  Administered 2014-07-18: 4 mg via INTRAVENOUS

## 2014-07-18 MED ORDER — GLYCOPYRROLATE 0.2 MG/ML IJ SOLN
INTRAMUSCULAR | Status: DC | PRN
  Administered 2014-07-18 (×2): 0.2 mg via INTRAVENOUS

## 2014-07-18 MED ORDER — FENTANYL CITRATE 0.05 MG/ML IJ SOLN: 25.0000 ug | INTRAMUSCULAR | Status: DC | PRN

## 2014-07-18 MED ORDER — MEPERIDINE HCL 25 MG/ML IJ SOLN: 6.2500 mg | INTRAMUSCULAR | Status: DC | PRN

## 2014-07-18 MED ORDER — FENTANYL CITRATE 0.05 MG/ML IJ SOLN
INTRAMUSCULAR | Status: AC
  Filled 2014-07-18: qty 2

## 2014-07-18 MED ORDER — LIDOCAINE HCL (CARDIAC) 20 MG/ML IV SOLN
INTRAVENOUS | Status: DC | PRN
  Administered 2014-07-18: 80 mg via INTRAVENOUS

## 2014-07-18 MED ORDER — DEXAMETHASONE SODIUM PHOSPHATE 10 MG/ML IJ SOLN
INTRAMUSCULAR | Status: DC | PRN
  Administered 2014-07-18: 4 mg via INTRAVENOUS

## 2014-07-18 MED ORDER — EPHEDRINE SULFATE 50 MG/ML IJ SOLN
INTRAMUSCULAR | Status: DC | PRN
  Administered 2014-07-18: 10 mg via INTRAVENOUS
  Administered 2014-07-18: 5 mg via INTRAVENOUS
  Administered 2014-07-18: 10 mg via INTRAVENOUS

## 2014-07-18 MED ORDER — SODIUM CHLORIDE 0.9 % IR SOLN
Status: DC | PRN
  Administered 2014-07-18: 3000 mL

## 2014-07-18 MED ORDER — DEXAMETHASONE SODIUM PHOSPHATE 4 MG/ML IJ SOLN
INTRAMUSCULAR | Status: AC
  Filled 2014-07-18: qty 1

## 2014-07-18 MED ORDER — MIDAZOLAM HCL 2 MG/2ML IJ SOLN
INTRAMUSCULAR | Status: DC | PRN
  Administered 2014-07-18: 2 mg via INTRAVENOUS

## 2014-07-18 SURGICAL SUPPLY — 19 items
CANISTER SUCT 3000ML (MISCELLANEOUS) ×4 IMPLANT
CATH ROBINSON RED A/P 16FR (CATHETERS) ×4 IMPLANT
CLOTH BEACON ORANGE TIMEOUT ST (SAFETY) ×4 IMPLANT
CONTAINER PREFILL 10% NBF 60ML (FORM) ×8 IMPLANT
DEVICE MYOSURE LITE (MISCELLANEOUS) ×4 IMPLANT
ELECT REM PT RETURN 9FT ADLT (ELECTROSURGICAL) ×4
ELECTRODE REM PT RTRN 9FT ADLT (ELECTROSURGICAL) ×2 IMPLANT
ELECTRODE RT ANGLE VERSAPOINT (CUTTING LOOP) ×4 IMPLANT
GLOVE BIO SURGEON STRL SZ 6.5 (GLOVE) ×3 IMPLANT
GLOVE BIO SURGEONS STRL SZ 6.5 (GLOVE) ×1
GLOVE BIOGEL PI IND STRL 7.0 (GLOVE) ×2 IMPLANT
GLOVE BIOGEL PI INDICATOR 7.0 (GLOVE) ×2
GOWN STRL REUS W/TWL LRG LVL3 (GOWN DISPOSABLE) ×12 IMPLANT
PACK VAGINAL MINOR WOMEN LF (CUSTOM PROCEDURE TRAY) ×4 IMPLANT
PAD OB MATERNITY 4.3X12.25 (PERSONAL CARE ITEMS) ×4 IMPLANT
TOWEL OR 17X24 6PK STRL BLUE (TOWEL DISPOSABLE) ×8 IMPLANT
TUBING AQUILEX INFLOW (TUBING) ×4 IMPLANT
TUBING AQUILEX OUTFLOW (TUBING) ×4 IMPLANT
WATER STERILE IRR 1000ML POUR (IV SOLUTION) ×4 IMPLANT

## 2014-07-18 NOTE — Anesthesia Preprocedure Evaluation (Addendum)
Anesthesia Evaluation  Patient identified by MRN, date of birth, ID band Patient awake    Reviewed: Allergy & Precautions, H&P , NPO status , Patient's Chart, lab work & pertinent test results  Airway Mallampati: III  TM Distance: >3 FB Neck ROM: Full    Dental  (+) Chipped, Missing,    Pulmonary asthma ,  breath sounds clear to auscultation  Pulmonary exam normal       Cardiovascular hypertension, Pt. on medications and Pt. on home beta blockers + CAD, + Past MI and +CHF Rhythm:Regular Rate:Normal  NSTEMI 09/2010 Chronic non ischemic systolic CHF Last EF 73% in 2012 on Echo Diffuse hypokinesis   Neuro/Psych negative neurological ROS  negative psych ROS   GI/Hepatic negative GI ROS, Neg liver ROS,   Endo/Other  diabetes, Well Controlled, Type 2, Insulin DependentObesity Hyperlipidemia  Renal/GU Renal disease  negative genitourinary   Musculoskeletal negative musculoskeletal ROS (+)   Abdominal (+) + obese,   Peds  Hematology  (+) anemia ,   Anesthesia Other Findings   Reproductive/Obstetrics Post menopausal bleeding Uterine fibroids                            Anesthesia Physical Anesthesia Plan  ASA: III  Anesthesia Plan: General   Post-op Pain Management:    Induction: Intravenous  Airway Management Planned: LMA  Additional Equipment:   Intra-op Plan:   Post-operative Plan: Extubation in OR  Informed Consent: I have reviewed the patients History and Physical, chart, labs and discussed the procedure including the risks, benefits and alternatives for the proposed anesthesia with the patient or authorized representative who has indicated his/her understanding and acceptance.   Dental advisory given  Plan Discussed with: CRNA, Anesthesiologist and Surgeon  Anesthesia Plan Comments:         Anesthesia Quick Evaluation

## 2014-07-18 NOTE — Transfer of Care (Signed)
Immediate Anesthesia Transfer of Care Note  Patient: Brittany Evans  Procedure(s) Performed: Procedure(s): DILATATION & CURETTAGE/HYSTEROSCOPY WITH MYOSURE (N/A) POLYPECTOMY (N/A)  Patient Location: PACU  Anesthesia Type:General  Level of Consciousness: awake, alert , oriented and patient cooperative  Airway & Oxygen Therapy: Patient Spontanous Breathing and Patient connected to nasal cannula oxygen  Post-op Assessment: Report given to PACU RN and Post -op Vital signs reviewed and stable  Post vital signs: Reviewed and stable  Complications: No apparent anesthesia complications

## 2014-07-18 NOTE — Discharge Instructions (Signed)
Hysteroscopy, Care After °Refer to this sheet in the next few weeks. These instructions provide you with information on caring for yourself after your procedure. Your health care provider may also give you more specific instructions. Your treatment has been planned according to current medical practices, but problems sometimes occur. Call your health care provider if you have any problems or questions after your procedure.  °WHAT TO EXPECT AFTER THE PROCEDURE °After your procedure, it is typical to have the following: °· You may have some cramping. This normally lasts for a couple days. °· You may have bleeding. This can vary from light spotting for a few days to menstrual-like bleeding for 3-7 days. °HOME CARE INSTRUCTIONS °· Rest for the first 1-2 days after the procedure. °· Only take over-the-counter or prescription medicines as directed by your health care provider. Do not take aspirin. It can increase the chances of bleeding. °· Take showers instead of baths for 2 weeks or as directed by your health care provider. °· Do not drive for 24 hours or as directed. °· Do not drink alcohol while taking pain medicine. °· Do not use tampons, douche, or have sexual intercourse for 2 weeks or until your health care provider says it is okay. °· Take your temperature twice a day for 4-5 days. Write it down each time. °· Follow your health care provider's advice about diet, exercise, and lifting. °· If you develop constipation, you may: °¨ Take a mild laxative if your health care provider approves. °¨ Add bran foods to your diet. °¨ Drink enough fluids to keep your urine clear or pale yellow. °· Try to have someone with you or available to you for the first 24-48 hours, especially if you were given a general anesthetic. °· Follow up with your health care provider as directed. °SEEK MEDICAL CARE IF: °· You feel dizzy or lightheaded. °· You feel sick to your stomach (nauseous). °· You have abnormal vaginal discharge. °· You  have a rash. °· You have pain that is not controlled with medicine. °SEEK IMMEDIATE MEDICAL CARE IF: °· You have bleeding that is heavier than a normal menstrual period. °· You have a fever. °· You have increasing cramps or pain, not controlled with medicine. °· You have new belly (abdominal) pain. °· You pass out. °· You have pain in the tops of your shoulders (shoulder strap areas). °· You have shortness of breath. °Document Released: 05/25/2013 Document Reviewed: 05/25/2013 °[employer]® Patient Information ©2015 [employer]. This information is not intended to replace advice given to you by your health care provider. Make sure you discuss any questions you have with your health care provider. ° °Post Anesthesia Home Care Instructions ° °Activity: °Get plenty of rest for the remainder of the day. A responsible adult should stay with you for 24 hours following the procedure.  °For the next 24 hours, DO NOT: °-Drive a car °-Operate machinery °-Drink alcoholic beverages °-Take any medication unless instructed by your physician °-Make any legal decisions or sign important papers. ° °Meals: °Start with liquid foods such as gelatin or soup. Progress to regular foods as tolerated. Avoid greasy, spicy, heavy foods. If nausea and/or vomiting occur, drink only clear liquids until the nausea and/or vomiting subsides. Call your physician if vomiting continues. ° °Special Instructions/Symptoms: °Your throat may feel dry or sore from the anesthesia or the breathing tube placed in your throat during surgery. If this causes discomfort, gargle with warm salt water. The discomfort should disappear within 24 hours. ° °

## 2014-07-18 NOTE — H&P (Signed)
Expand All Collapse All   Patient ID: Brittany Evans, female DOB: September 25, 1955, 58 y.o. MRN: [id number]  Chief Complaint  Patient presents with  . Follow-up    HPI Brittany Evans is a 58 y.o. female. [name] No LMP recorded. Patient is postmenopausal. Only scant V with Megace. Repeat US was reviewed  HPI  Past Medical History  Diagnosis Date  . Systolic CHF   . Diabetes mellitus   . Hypertension   . Asthma   . NICM (nonischemic cardiomyopathy)     EF 20-25%, 45% 2012  . CAD (coronary artery disease)     NSTEMI 2/12: LAD 30%, D1 40%, oCFX 70-80%, mRCA 10-20%, ?occl. of apical LAD; treated medically  . Hyperlipidemia   . Obesity     Past Surgical History  Procedure Laterality Date  . None    . No past surgeries      Family History  Problem Relation Age of Onset  . Cancer Mother   . Diabetes Mother   . Coronary artery disease Mother   . Heart disease Mother   . Cirrhosis Father     Alcohol abuse  . Alcohol abuse Father   . Heart disease Father   . Pancreatic cancer Maternal Grandmother   . Colon cancer Neg Hx   . Rectal cancer Neg Hx   . Stomach cancer Neg Hx     Social History History  Substance Use Topics  . Smoking status: Never Smoker   . Smokeless tobacco: Never Used  . Alcohol Use: No    No Known Allergies  Current Outpatient Prescriptions  Medication Sig Dispense Refill  . albuterol (PROAIR HFA) 108 (90 BASE) MCG/ACT inhaler Inhale 2 puffs into the lungs every 6 (six) hours as needed. Shortness of breath 1 Inhaler 3  . aspirin 81 MG tablet Take 1 tablet (81 mg total) by mouth daily. 30 tablet 3  . carvedilol (COREG) 12.5 MG tablet Take 1 tablet (12.5 mg total) by mouth 2 (two) times daily with a meal. 180 tablet 3  . hydrALAZINE (APRESOLINE) 50 MG tablet Take 50 mg by mouth 3 (three) times  daily.    . insulin NPH Human (HUMULIN N) 100 UNIT/ML injection Inject 0.15 mLs (15 Units total) into the skin 2 (two) times daily before a meal. 3 vial 3  . Insulin Syringe-Needle U-100 (INSULIN SYRINGE .3CC/31GX5/16") 31G X 5/16" 0.3 ML MISC 1 Syringe by Does not apply route as directed. 100 each 5  . megestrol (MEGACE) 20 MG tablet TAKE 1 TABLET BY MOUTH EVERY DAY 30 tablet 0  . Multiple Vitamin (MULTI-VITAMINS) TABS TAKE 1 TABLET BY MOUTH ONCE DAILY 30 tablet 3  . nitroGLYCERIN (NITROSTAT) 0.4 MG SL tablet Place 1 tablet (0.4 mg total) under the tongue every 5 (five) minutes as needed. Chest pain 10 tablet 0  . simvastatin (ZOCOR) 40 MG tablet Take 1 tablet (40 mg total) by mouth at bedtime. 90 tablet 3  . sodium chloride (OCEAN) 0.65 % SOLN nasal spray Place 1 spray into both nostrils at bedtime.    Marland Kitchen spironolactone (ALDACTONE) 25 MG tablet TAKE 1 TABLET BY MOUTH DAILY 90 tablet 0   No current facility-administered medications for this visit.    Review of Systems Review of Systems  Constitutional: Negative.  Genitourinary: Positive for vaginal bleeding and menstrual problem. Negative for pelvic pain.    Blood pressure 145/90, pulse 68, temperature 98.4 F (36.9 C), temperature source Oral, resp. rate 18, SpO2 98 %.   Physical Exam Physical  Exam  Constitutional: She is oriented to person, place, and time. She appears well-developed. No distress.  Pulmonary/Chest: Effort normal.  Neurological: She is alert and oriented to person, place, and time.  Psychiatric: She has a normal mood and affect. Her behavior is normal.    Data Reviewed CLINICAL DATA: Uterine leiomyoma. Postmenopausal bleeding for 1  year.  EXAM:  TRANSABDOMINAL ULTRASOUND OF PELVIS  TECHNIQUE:  Transabdominal ultrasound examination of the pelvis was performed  including evaluation of the uterus, ovaries, adnexal regions, and  pelvic  cul-de-sac.  COMPARISON: 12/10/2013  FINDINGS:  Uterus  Measurements: At least 12.5 x 8.3 x 9.2 cm and possibly larger.  Multiple fibroids are identified. The largest 3 are measured. In the  fundal region a fibroid is 4.4 x 3.8 x 3.7 cm. A left lower uterine  submucosal fibroid is 3.4 x 3.2 x 3.2 cm. A left mid uterine fibroid  is 4.3 x 4.1 x 3.7 cm.  Endometrium  Thickness: 21.6 mm. Heterogeneous with small cystic spaces. No  discrete mass identified however  Right ovary  Measurements: 2.7 x 1.7 x 1.7 cm. Normal appearance/no adnexal mass.  Left ovary  Measurements: 2.1 x 1.6 x 1.6 cm. Normal appearance/no adnexal mass.  Other findings: No free fluid  IMPRESSION:  1. Enlarged uterus containing multiple fibroids.  2. Thickened endometrial stripe. In the setting of post-menopausal  bleeding, endometrial sampling is indicated to exclude carcinoma. If  results are benign, sonohysterogram should be considered for focal  lesion work-up. (Ref: Radiological Reasoning: Algorithmic Workup of  Abnormal Vaginal Bleeding with Endovaginal Sonography and  Sonohysterography. AJR 2008; 295:M84-13).  Electronically Signed  By: Shon Hale M.D.  On: 05/30/2014 11:33   Assessment    PMP bleeding , fibroid uterus, had nl EMBx 01/2014     Plan    Offered hysteroscopy and resection of submucous fibroid, curettage. The procedure and the risk of anesthesia, bleeding, infection, bowel and bladder injury,were discussed and her questions were answered. The procedure will be scheduled as an outpatient.     H&P updated    ARNOLD,JAMES 07/18/2014 12:21 PM

## 2014-07-18 NOTE — Op Note (Signed)
Procedure: Hysteroscopy dilation and curettage Preoperative diagnosis: Postmenopausal bleeding with ultrasound diagnosis submucosal fibroid Postoperative diagnosis: Postmenopausal bleeding and endometrial polyps Anesthesia: LMA Surgeon: Dr. Emeterio Reeve Specimen: Endometrial curettings Complications: None Drains: None Counts: Correct   Patient gave written consent for hysteroscopy and resection of a submucosal fibroid that was identified on ultrasound and endometrial curettings. Patient identification was confirmed and she was brought the operating room and LMA was induced. She was placed in dorsal lithotomy position. Exam revealed an 8-10 weeks size irregular uterus consistent with fibroids. Perineum and vagina were sterilely prepped and draped bladder was drained with a red rubber catheter. Speculum was inserted. Half percent Marcaine was infiltrated for intracervical block. Cervix was grasped with single-tooth tenaculum. After dilating sufficiently to pass the Myosure hysteroscope with video camera and use endometrial polyps were visualized and the cavity. Tubal ostia were seen. There were no submucosal fibroids. The Myosure device was assembled but the view in the cavity was too cloudy due to bleeding to use the Myosure for removing polyps. Hysteroscope was removed and curettage of the endometrial cavity was performed with polyps and endometrial tissue obtained. All instruments were then removed. There was minimal bleeding at the end of the procedure. Specimen was sent to pathology. It was about 300 mL liter fluid deficit. She tolerated procedure well without complications and was brought in stable condition to PACU.  Dr. Emeterio Reeve 07/18/2014 1400

## 2014-07-18 NOTE — Anesthesia Postprocedure Evaluation (Signed)
  Anesthesia Post-op Note  Patient: Brittany Evans  Procedure(s) Performed: Procedure(s): DILATATION & CURETTAGE/HYSTEROSCOPY WITH MYOSURE (N/A) POLYPECTOMY (N/A)  Patient Location: PACU  Anesthesia Type:General  Level of Consciousness: awake, alert  and oriented  Airway and Oxygen Therapy: Patient Spontanous Breathing  Post-op Pain: none  Post-op Assessment: Post-op Vital signs reviewed, Patient's Cardiovascular Status Stable, Respiratory Function Stable, Patent Airway, No signs of Nausea or vomiting and Pain level controlled  Post-op Vital Signs: Reviewed and stable  Last Vitals:  Filed Vitals:   07/18/14 1430  BP: 157/81  Pulse: 69  Temp:   Resp: 13    Complications: No apparent anesthesia complications

## 2014-07-19 ENCOUNTER — Encounter (HOSPITAL_COMMUNITY): Payer: Self-pay | Admitting: Obstetrics & Gynecology

## 2014-07-21 ENCOUNTER — Ambulatory Visit: Payer: Commercial Managed Care - HMO | Attending: Internal Medicine | Admitting: *Deleted

## 2014-07-21 ENCOUNTER — Ambulatory Visit (HOSPITAL_BASED_OUTPATIENT_CLINIC_OR_DEPARTMENT_OTHER): Payer: Commercial Managed Care - HMO | Admitting: *Deleted

## 2014-07-21 DIAGNOSIS — Z23 Encounter for immunization: Secondary | ICD-10-CM

## 2014-08-03 ENCOUNTER — Ambulatory Visit: Payer: Commercial Managed Care - HMO | Admitting: Obstetrics & Gynecology

## 2014-08-03 ENCOUNTER — Encounter: Payer: Self-pay | Admitting: Obstetrics & Gynecology

## 2014-08-03 VITALS — BP 145/68 | HR 59 | Temp 98.4°F | Wt 213.1 lb

## 2014-08-03 DIAGNOSIS — M722 Plantar fascial fibromatosis: Secondary | ICD-10-CM

## 2014-08-03 NOTE — Progress Notes (Signed)
Subjective: left foot pain     Brittany Evans is a 58 y.o. female who presents to the clinic 2 weeks status post diagnostic hysteroscopy and curettage for abnormal uterine bleeding. Eating a regular diet without difficulty. Bowel movements are normal. left plantar foot pain in AM  The following portions of the patient's history were reviewed and updated as appropriate: allergies, current medications, past family history, past medical history, past social history, past surgical history and problem list.  Review of Systems Pertinent items are noted in HPI.    Objective:    BP 145/68 mmHg  Pulse 59  Temp(Src) 98.4 F (36.9 C) (Oral)  Wt 213 lb 1.6 oz (96.662 kg) General:  alert and cooperative           Assessment:    Doing well postoperatively. Operative findings again reviewed. Pathology report discussed.    Plan:    1. Continue any current medications. 2. Wound care discussed. 3. Activity restrictions: none 4. Anticipated return to work: now. 5. Follow upprn. Will stop hormone. Instructions for plantar fasciitis Woodroe Mode, MD 08/03/2014

## 2014-08-03 NOTE — Patient Instructions (Signed)
Plantar Fasciitis  Plantar fasciitis is a common condition that causes foot pain. It is soreness (inflammation) of the band of tough fibrous tissue on the bottom of the foot that runs from the heel bone (calcaneus) to the ball of the foot. The cause of this soreness may be from excessive standing, poor fitting shoes, running on hard surfaces, being overweight, having an abnormal walk, or overuse (this is common in runners) of the painful foot or feet. It is also common in aerobic exercise dancers and ballet dancers.  SYMPTOMS   Most people with plantar fasciitis complain of:   Severe pain in the morning on the bottom of their foot especially when taking the first steps out of bed. This pain recedes after a few minutes of walking.   Severe pain is experienced also during walking following a long period of inactivity.   Pain is worse when walking barefoot or up stairs  DIAGNOSIS    Your caregiver will diagnose this condition by examining and feeling your foot.   Special tests such as X-rays of your foot, are usually not needed.  PREVENTION    Consult a sports medicine professional before beginning a new exercise program.   Walking programs offer a good workout. With walking there is a lower chance of overuse injuries common to runners. There is less impact and less jarring of the joints.   Begin all new exercise programs slowly. If problems or pain develop, decrease the amount of time or distance until you are at a comfortable level.   Wear good shoes and replace them regularly.   Stretch your foot and the heel cords at the back of the ankle (Achilles tendon) both before and after exercise.   Run or exercise on even surfaces that are not hard. For example, asphalt is better than pavement.   Do not run barefoot on hard surfaces.   If using a treadmill, vary the incline.   Do not continue to workout if you have foot or joint problems. Seek professional help if they do not improve.  HOME CARE INSTRUCTIONS     Avoid activities that cause you pain until you recover.   Use ice or cold packs on the problem or painful areas after working out.   Only take over-the-counter or prescription medicines for pain, discomfort, or fever as directed by your caregiver.   Soft shoe inserts or athletic shoes with air or gel sole cushions may be helpful.   If problems continue or become more severe, consult a sports medicine caregiver or your own health care provider. Cortisone is a potent anti-inflammatory medication that may be injected into the painful area. You can discuss this treatment with your caregiver.  MAKE SURE YOU:    Understand these instructions.   Will watch your condition.   Will get help right away if you are not doing well or get worse.  Document Released: 04/29/2001 Document Revised: 10/27/2011 Document Reviewed: 06/28/2008  ExitCare Patient Information 2015 [employer]. This information is not intended to replace advice given to you by your health care provider. Make sure you discuss any questions you have with your health care provider.

## 2014-08-04 ENCOUNTER — Other Ambulatory Visit: Payer: Self-pay | Admitting: Internal Medicine

## 2014-08-15 ENCOUNTER — Other Ambulatory Visit: Payer: Self-pay | Admitting: Emergency Medicine

## 2014-08-15 MED ORDER — SPIRONOLACTONE 25 MG PO TABS: 25.0000 mg | ORAL_TABLET | Freq: Every day | ORAL | Status: DC

## 2014-08-23 ENCOUNTER — Other Ambulatory Visit: Payer: Self-pay | Admitting: Internal Medicine

## 2014-08-30 ENCOUNTER — Telehealth: Payer: Self-pay

## 2014-08-30 NOTE — Telephone Encounter (Signed)
Patient walked into clinic today c/o of heavy bleeding since last Thursday 08/24/14. Reports having to use super pads and going through about 6 pads per day, changing every 3-4 hours. Per chart review, had post-op after diagnostic hysteroscopy on 08/03/14-- reports bleeding was mild at that time and only became heavy last week. C/o of cramping and passing large clots. Appointment made to see Dr. Roselie Awkward 09/06/14. Advised patient to use motrin for cramping and to come to [facility] should bleeding get worse-- advised she should not be needing to change pad every hour and if so that is TOO much bleeding requiring emergent evaluation. Patient verbalized understanding and gratitude. NO further questions or concerns.

## 2014-09-06 ENCOUNTER — Encounter: Payer: Self-pay | Admitting: Obstetrics & Gynecology

## 2014-09-06 ENCOUNTER — Ambulatory Visit (INDEPENDENT_AMBULATORY_CARE_PROVIDER_SITE_OTHER): Payer: Commercial Managed Care - HMO | Admitting: Obstetrics & Gynecology

## 2014-09-06 VITALS — BP 132/75 | HR 63 | Temp 98.7°F | Wt 210.6 lb

## 2014-09-06 DIAGNOSIS — D259 Leiomyoma of uterus, unspecified: Secondary | ICD-10-CM

## 2014-09-06 MED ORDER — MEGESTROL ACETATE 40 MG PO TABS: 40.0000 mg | ORAL_TABLET | Freq: Every day | ORAL | Status: DC

## 2014-09-06 NOTE — Patient Instructions (Signed)

## 2014-09-06 NOTE — Progress Notes (Signed)
Patient here today as she had been concerned for having very heavy bleeding last week after D/C-- reports going through 6-7 super pads per day. Reports cramping has since slowed and bleeding decreased to the point where she only needs a panty-liner.

## 2014-09-06 NOTE — Progress Notes (Signed)
Subjective:     Patient ID: Brittany Evans, female   DOB: [birth year], 59 y.o.   MRN: [id number]  [id number] No LMP recorded. Patient is postmenopausal. H/O fibroids, had hysteroscopy and D&C 07/18/14, still with DUB but much less today.    Review of Systems  Constitutional: Negative for fever and chills.  Gastrointestinal: Negative.   Genitourinary: Positive for menstrual problem. Negative for vaginal discharge and pelvic pain.       Objective:   Physical Exam  Constitutional: She appears well-developed. No distress.  Pulmonary/Chest: Effort normal.  Psychiatric: She has a normal mood and affect. Her behavior is normal.       Assessment:     Fibroid, DUB     Plan:     Megace 40 mg /day, RTC 2 mo, consider Mirena  Woodroe Mode, MD 09/06/2014

## 2014-09-11 ENCOUNTER — Other Ambulatory Visit: Payer: Self-pay | Admitting: Internal Medicine

## 2014-10-02 ENCOUNTER — Other Ambulatory Visit: Payer: Self-pay | Admitting: Internal Medicine

## 2014-10-17 ENCOUNTER — Other Ambulatory Visit: Payer: Self-pay | Admitting: Internal Medicine

## 2014-10-17 DIAGNOSIS — Z1231 Encounter for screening mammogram for malignant neoplasm of breast: Secondary | ICD-10-CM

## 2014-10-19 ENCOUNTER — Other Ambulatory Visit: Payer: Self-pay | Admitting: Internal Medicine

## 2014-10-19 NOTE — Telephone Encounter (Signed)
Patient called to request a med refills for her medications, patient stated that she has not been able to make an appointment. Please f/u with pt.

## 2014-10-19 NOTE — Telephone Encounter (Signed)
Left voice message to return call 

## 2014-10-25 ENCOUNTER — Ambulatory Visit (INDEPENDENT_AMBULATORY_CARE_PROVIDER_SITE_OTHER): Payer: Commercial Managed Care - HMO | Admitting: Obstetrics & Gynecology

## 2014-10-25 ENCOUNTER — Encounter: Payer: Self-pay | Admitting: Obstetrics & Gynecology

## 2014-10-25 VITALS — BP 157/92 | HR 57 | Temp 98.4°F | Ht 65.0 in | Wt 214.3 lb

## 2014-10-25 DIAGNOSIS — D259 Leiomyoma of uterus, unspecified: Secondary | ICD-10-CM

## 2014-10-25 MED ORDER — MEGESTROL ACETATE 40 MG PO TABS: 40.0000 mg | ORAL_TABLET | Freq: Every day | ORAL | Status: DC

## 2014-10-25 NOTE — Progress Notes (Signed)
Patient ID: Brittany Evans, female   DOB: [birth year], 59 y.o.   MRN: [id number] No vaginal bleeding taking megace now daily. Will continue this and repeat US for f/u fibroids, RTC 4 months.   Woodroe Mode, MD 10/25/2014

## 2014-10-25 NOTE — Patient Instructions (Signed)

## 2014-10-26 ENCOUNTER — Ambulatory Visit: Payer: Medicare HMO | Admitting: Internal Medicine

## 2014-10-30 ENCOUNTER — Ambulatory Visit (HOSPITAL_COMMUNITY)
Admission: RE | Admit: 2014-10-30 | Discharge: 2014-10-30 | Disposition: A | Discharge status: Home / Self Care | Payer: Commercial Managed Care - HMO | Source: Ambulatory Visit | Attending: Obstetrics & Gynecology | Admitting: Obstetrics & Gynecology

## 2014-10-30 DIAGNOSIS — D259 Leiomyoma of uterus, unspecified: Secondary | ICD-10-CM | POA: Diagnosis not present

## 2014-10-30 DIAGNOSIS — N852 Hypertrophy of uterus: Secondary | ICD-10-CM | POA: Diagnosis not present

## 2014-11-07 ENCOUNTER — Ambulatory Visit: Payer: Medicare HMO | Admitting: Internal Medicine

## 2014-12-04 ENCOUNTER — Ambulatory Visit (HOSPITAL_COMMUNITY)
Admission: RE | Admit: 2014-12-04 | Discharge: 2014-12-04 | Disposition: A | Discharge status: Home / Self Care | Payer: Commercial Managed Care - HMO | Source: Ambulatory Visit | Attending: Internal Medicine | Admitting: Internal Medicine

## 2014-12-04 ENCOUNTER — Other Ambulatory Visit (HOSPITAL_COMMUNITY): Payer: Self-pay | Admitting: Nurse Practitioner

## 2014-12-04 DIAGNOSIS — Z1231 Encounter for screening mammogram for malignant neoplasm of breast: Secondary | ICD-10-CM | POA: Diagnosis present

## 2014-12-27 ENCOUNTER — Ambulatory Visit (INDEPENDENT_AMBULATORY_CARE_PROVIDER_SITE_OTHER): Payer: Medicare HMO | Admitting: Obstetrics & Gynecology

## 2014-12-27 ENCOUNTER — Encounter: Payer: Self-pay | Admitting: Obstetrics & Gynecology

## 2014-12-27 VITALS — BP 119/83 | HR 74 | Temp 98.7°F | Resp 20 | Ht 64.0 in | Wt 202.1 lb

## 2014-12-27 DIAGNOSIS — D259 Leiomyoma of uterus, unspecified: Secondary | ICD-10-CM

## 2014-12-27 MED ORDER — MEGESTROL ACETATE 40 MG PO TABS: 40.0000 mg | ORAL_TABLET | Freq: Every day | ORAL | Status: DC

## 2014-12-27 NOTE — Patient Instructions (Signed)
Fibroids Fibroids are lumps (tumors) that can occur any place in a woman's body. These lumps are not cancerous. Fibroids vary in size, weight, and where they grow. HOME CARE  Do not take aspirin.  Write down the number of pads or tampons you use during your period. Tell your doctor. This can help determine the best treatment for you. GET HELP RIGHT AWAY IF:  You have pain in your lower belly (abdomen) that is not helped with medicine.  You have cramps that are not helped with medicine.  You have more bleeding between or during your period.  You feel lightheaded or pass out (faint).  Your lower belly pain gets worse. MAKE SURE YOU:  Understand these instructions.  Will watch your condition.  Will get help right away if you are not doing well or get worse. Document Released: 09/06/2010 Document Revised: 10/27/2011 Document Reviewed: 09/06/2010 ExitCare Patient Information 2015 [employer]. This information is not intended to replace advice given to you by your health care provider. Make sure you discuss any questions you have with your health care provider.  

## 2014-12-27 NOTE — Progress Notes (Signed)
Subjective:     Patient ID: Brittany Evans, female   DOB: 04-13-1956, 59 y.o.   MRN: [id number]  HPIOn megace, notes some irregular light spotting only when she wipes, no pain.   Review of Systems  Constitutional: Negative.   Respiratory: Negative.   Gastrointestinal: Negative.   Genitourinary: Positive for vaginal bleeding (spotting). Negative for vaginal discharge and pelvic pain.       Objective:   Physical Exam  Constitutional: She is oriented to person, place, and time. She appears well-developed. No distress.  Cardiovascular: Normal rate.   Pulmonary/Chest: Effort normal.  Neurological: She is alert and oriented to person, place, and time.  Psychiatric: She has a normal mood and affect. Her behavior is normal.  Vitals reviewed.   CLINICAL DATA: Fibroids  EXAM: TRANSABDOMINAL AND TRANSVAGINAL ULTRASOUND OF PELVIS  TECHNIQUE: Both transabdominal and transvaginal ultrasound examinations of the pelvis were performed. Transabdominal technique was performed for global imaging of the pelvis including uterus, ovaries, adnexal regions, and pelvic cul-de-sac. It was necessary to proceed with endovaginal exam following the transabdominal exam to visualize the endometrium and ovary.  COMPARISON: 05/30/2014  FINDINGS: Uterus  Measurements: 12.8 x 7.9 x 8.6 cm. Multiple fibroids again noted. Left fundal fibroid measures 4.8 x 5.0 x 3.7 cm. Left mid uterine fibroid posteriorly measures 4.2 x 4.2 x 4.0 cm. Left lower uterus segment fibroid measures 4.4 x 3.9 x 3.9 cm.  Endometrium  Thickness: 7 mm in thickness where visualized. Difficult to visualize due to fibroids.  Right ovary  Measurements: Not visualized. No adnexal masses seen.  Left ovary  Measurements: Not visualize. No adnexal masses seen.  Other findings  No free fluid.  IMPRESSION: Multiple uterine fibroids with enlarged uterus. Overall, no significant change since prior  study.   Electronically Signed  By: Rolm Baptise M.D.  On: 10/30/2014 08:28        Assessment:     H/o fibroids, nl endometrium , slight spotting on Megace 40 mg daily     Plan:     Report if sx increase o/w reassured, continue Megace  Woodroe Mode, MD 12/27/2014

## 2014-12-27 NOTE — Progress Notes (Signed)
Pt states she has been having some light spotting x2 weeks.

## 2015-01-03 ENCOUNTER — Other Ambulatory Visit: Payer: Self-pay | Admitting: Internal Medicine

## 2015-01-08 ENCOUNTER — Other Ambulatory Visit: Payer: Self-pay

## 2015-04-05 ENCOUNTER — Other Ambulatory Visit: Payer: Self-pay | Admitting: Obstetrics and Gynecology

## 2015-04-11 ENCOUNTER — Encounter (HOSPITAL_COMMUNITY): Payer: Self-pay

## 2015-04-11 ENCOUNTER — Other Ambulatory Visit: Payer: Self-pay

## 2015-04-11 ENCOUNTER — Encounter (HOSPITAL_COMMUNITY)
Admission: RE | Admit: 2015-04-11 | Discharge: 2015-04-11 | Disposition: A | Discharge status: Home / Self Care | Payer: Medicare HMO | Source: Ambulatory Visit | Attending: Obstetrics and Gynecology | Admitting: Obstetrics and Gynecology

## 2015-04-11 DIAGNOSIS — Z79899 Other long term (current) drug therapy: Secondary | ICD-10-CM | POA: Diagnosis not present

## 2015-04-11 DIAGNOSIS — R938 Abnormal findings on diagnostic imaging of other specified body structures: Secondary | ICD-10-CM | POA: Diagnosis not present

## 2015-04-11 DIAGNOSIS — Z8249 Family history of ischemic heart disease and other diseases of the circulatory system: Secondary | ICD-10-CM | POA: Diagnosis not present

## 2015-04-11 DIAGNOSIS — Z7951 Long term (current) use of inhaled steroids: Secondary | ICD-10-CM | POA: Diagnosis not present

## 2015-04-11 DIAGNOSIS — Z803 Family history of malignant neoplasm of breast: Secondary | ICD-10-CM | POA: Diagnosis not present

## 2015-04-11 DIAGNOSIS — Z8049 Family history of malignant neoplasm of other genital organs: Secondary | ICD-10-CM | POA: Diagnosis not present

## 2015-04-11 DIAGNOSIS — Z833 Family history of diabetes mellitus: Secondary | ICD-10-CM | POA: Diagnosis not present

## 2015-04-11 DIAGNOSIS — I129 Hypertensive chronic kidney disease with stage 1 through stage 4 chronic kidney disease, or unspecified chronic kidney disease: Secondary | ICD-10-CM | POA: Diagnosis not present

## 2015-04-11 DIAGNOSIS — E780 Pure hypercholesterolemia: Secondary | ICD-10-CM | POA: Diagnosis not present

## 2015-04-11 DIAGNOSIS — Z7982 Long term (current) use of aspirin: Secondary | ICD-10-CM | POA: Diagnosis not present

## 2015-04-11 DIAGNOSIS — E1122 Type 2 diabetes mellitus with diabetic chronic kidney disease: Secondary | ICD-10-CM | POA: Diagnosis not present

## 2015-04-11 DIAGNOSIS — D250 Submucous leiomyoma of uterus: Secondary | ICD-10-CM | POA: Diagnosis not present

## 2015-04-11 DIAGNOSIS — N924 Excessive bleeding in the premenopausal period: Secondary | ICD-10-CM | POA: Diagnosis present

## 2015-04-11 DIAGNOSIS — N189 Chronic kidney disease, unspecified: Secondary | ICD-10-CM | POA: Diagnosis not present

## 2015-04-11 HISTORY — DX: Cardiac arrhythmia, unspecified: I49.9

## 2015-04-11 HISTORY — DX: Peripheral vascular disease, unspecified: I73.9

## 2015-04-11 HISTORY — DX: Acute myocardial infarction, unspecified: I21.9

## 2015-04-11 HISTORY — DX: Adverse effect of unspecified anesthetic, initial encounter: T41.45XA

## 2015-04-11 HISTORY — DX: Other complications of anesthesia, initial encounter: T88.59XA

## 2015-04-11 LAB — CBC
HEMATOCRIT: 40.8 % (ref 36.0–46.0)
Hemoglobin: 13.8 g/dL (ref 12.0–15.0)
MCH: 29.9 pg (ref 26.0–34.0)
MCHC: 33.8 g/dL (ref 30.0–36.0)
MCV: 88.5 fL (ref 78.0–100.0)
Platelets: 237 10*3/uL (ref 150–400)
RBC: 4.61 MIL/uL (ref 3.87–5.11)
RDW: 13.7 % (ref 11.5–15.5)
WBC: 5.1 10*3/uL (ref 4.0–10.5)

## 2015-04-11 LAB — BASIC METABOLIC PANEL
ANION GAP: 9 (ref 5–15)
BUN: 22 mg/dL — ABNORMAL HIGH (ref 6–20)
CALCIUM: 9.8 mg/dL (ref 8.9–10.3)
CO2: 23 mmol/L (ref 22–32)
Chloride: 107 mmol/L (ref 101–111)
Creatinine, Ser: 1.34 mg/dL — ABNORMAL HIGH (ref 0.44–1.00)
GFR calc Af Amer: 50 mL/min — ABNORMAL LOW (ref >60)
GFR calc non Af Amer: 43 mL/min — ABNORMAL LOW (ref >60)
GLUCOSE: 112 mg/dL — AB (ref 65–99)
Potassium: 4 mmol/L (ref 3.5–5.1)
Sodium: 139 mmol/L (ref 135–145)

## 2015-04-11 NOTE — Pre-Procedure Instructions (Signed)
Selina Cooley FNP at [facility] made aware of OSA score.  Will fax information to her office per her request.

## 2015-04-11 NOTE — Patient Instructions (Addendum)
Your procedure is scheduled on:  Thursday, April 12, 2015  Enter through the Main Entrance of [facility] at: 11:45 a.m.  Pick up the phone at the desk and dial [phone].  Call this number if you have problems the morning of surgery: [phone].  Remember: Do NOT eat food:  AFTER MIDNIGHT TONIGHT Do NOT drink clear liquids after:  AFTER 9:00 AM TOMORROW MORNING Take these medicines the morning of surgery with a SIP OF WATER:  CARVEDILOL (COREG), SPIRONOLACTONE (ALDACTONE)  *DO NOT TAKE METFORMIN 24 HOURS PRIOR TO SURGERY  *DO NOT TAKE DIABETIC MEDICATION MORNING OF SURGERY  *BRING ASTHMA INHALER DAY OF SURGERY  Do NOT wear jewelry (body piercing), metal hair clips/bobby pins, make-up, or nail polish. Do NOT wear lotions, powders, or perfumes.  You may wear deoderant. Do NOT shave for 48 hours prior to surgery. Do NOT bring valuables to the hospital. Contacts, dentures, or bridgework may not be worn into surgery.  Have a responsible adult drive you home and stay with you for 24 hours after your procedure

## 2015-04-12 ENCOUNTER — Encounter (HOSPITAL_COMMUNITY)
Admission: RE | Disposition: A | Discharge status: Home / Self Care | Payer: Self-pay | Source: Ambulatory Visit | Attending: Obstetrics and Gynecology

## 2015-04-12 ENCOUNTER — Ambulatory Visit (HOSPITAL_COMMUNITY): Payer: Medicare HMO | Admitting: Certified Registered Nurse Anesthetist

## 2015-04-12 ENCOUNTER — Encounter (HOSPITAL_COMMUNITY): Payer: Self-pay | Admitting: Certified Registered Nurse Anesthetist

## 2015-04-12 ENCOUNTER — Ambulatory Visit (HOSPITAL_COMMUNITY)
Admission: RE | Admit: 2015-04-12 | Discharge: 2015-04-12 | Disposition: A | Discharge status: Home / Self Care | Payer: Medicare HMO | Source: Ambulatory Visit | Attending: Obstetrics and Gynecology | Admitting: Obstetrics and Gynecology

## 2015-04-12 DIAGNOSIS — Z803 Family history of malignant neoplasm of breast: Secondary | ICD-10-CM | POA: Insufficient documentation

## 2015-04-12 DIAGNOSIS — Z79899 Other long term (current) drug therapy: Secondary | ICD-10-CM | POA: Insufficient documentation

## 2015-04-12 DIAGNOSIS — N840 Polyp of corpus uteri: Secondary | ICD-10-CM

## 2015-04-12 DIAGNOSIS — N189 Chronic kidney disease, unspecified: Secondary | ICD-10-CM | POA: Insufficient documentation

## 2015-04-12 DIAGNOSIS — N950 Postmenopausal bleeding: Secondary | ICD-10-CM

## 2015-04-12 DIAGNOSIS — Z8249 Family history of ischemic heart disease and other diseases of the circulatory system: Secondary | ICD-10-CM | POA: Insufficient documentation

## 2015-04-12 DIAGNOSIS — Z8049 Family history of malignant neoplasm of other genital organs: Secondary | ICD-10-CM | POA: Insufficient documentation

## 2015-04-12 DIAGNOSIS — R938 Abnormal findings on diagnostic imaging of other specified body structures: Secondary | ICD-10-CM | POA: Diagnosis not present

## 2015-04-12 DIAGNOSIS — E780 Pure hypercholesterolemia: Secondary | ICD-10-CM | POA: Insufficient documentation

## 2015-04-12 DIAGNOSIS — Z7982 Long term (current) use of aspirin: Secondary | ICD-10-CM | POA: Insufficient documentation

## 2015-04-12 DIAGNOSIS — E1122 Type 2 diabetes mellitus with diabetic chronic kidney disease: Secondary | ICD-10-CM | POA: Diagnosis not present

## 2015-04-12 DIAGNOSIS — D250 Submucous leiomyoma of uterus: Secondary | ICD-10-CM | POA: Diagnosis not present

## 2015-04-12 DIAGNOSIS — I129 Hypertensive chronic kidney disease with stage 1 through stage 4 chronic kidney disease, or unspecified chronic kidney disease: Secondary | ICD-10-CM | POA: Insufficient documentation

## 2015-04-12 DIAGNOSIS — Z833 Family history of diabetes mellitus: Secondary | ICD-10-CM | POA: Insufficient documentation

## 2015-04-12 DIAGNOSIS — Z7951 Long term (current) use of inhaled steroids: Secondary | ICD-10-CM | POA: Insufficient documentation

## 2015-04-12 HISTORY — PX: DILATION AND CURETTAGE OF UTERUS: SHX78

## 2015-04-12 HISTORY — PX: DILATATION & CURRETTAGE/HYSTEROSCOPY WITH RESECTOCOPE: SHX5572

## 2015-04-12 LAB — GLUCOSE, CAPILLARY
GLUCOSE-CAPILLARY: 100 mg/dL — AB (ref 65–99)
Glucose-Capillary: 102 mg/dL — ABNORMAL HIGH (ref 65–99)

## 2015-04-12 SURGERY — DILATATION & CURETTAGE/HYSTEROSCOPY WITH RESECTOCOPE
Anesthesia: General | Site: Vagina

## 2015-04-12 MED ORDER — FENTANYL CITRATE (PF) 100 MCG/2ML IJ SOLN
INTRAMUSCULAR | Status: AC
  Filled 2015-04-12: qty 4

## 2015-04-12 MED ORDER — GLYCINE 1.5 % IR SOLN
Status: DC | PRN
  Administered 2015-04-12 (×3): 3000 mL

## 2015-04-12 MED ORDER — ACETAMINOPHEN 160 MG/5ML PO SOLN
ORAL | Status: AC
  Filled 2015-04-12: qty 40.6

## 2015-04-12 MED ORDER — FENTANYL CITRATE (PF) 100 MCG/2ML IJ SOLN
INTRAMUSCULAR | Status: AC
  Filled 2015-04-12: qty 2

## 2015-04-12 MED ORDER — PROPOFOL 10 MG/ML IV BOLUS
INTRAVENOUS | Status: DC | PRN
  Administered 2015-04-12: 160 mg via INTRAVENOUS

## 2015-04-12 MED ORDER — SCOPOLAMINE 1 MG/3DAYS TD PT72
1.0000 | MEDICATED_PATCH | Freq: Once | TRANSDERMAL | Status: DC
  Administered 2015-04-12: 1.5 mg via TRANSDERMAL

## 2015-04-12 MED ORDER — DEXAMETHASONE SODIUM PHOSPHATE 10 MG/ML IJ SOLN
INTRAMUSCULAR | Status: DC | PRN
  Administered 2015-04-12: 4 mg via INTRAVENOUS

## 2015-04-12 MED ORDER — LACTATED RINGERS IV SOLN
INTRAVENOUS | Status: DC
  Administered 2015-04-12: 13:00:00 via INTRAVENOUS

## 2015-04-12 MED ORDER — DEXAMETHASONE SODIUM PHOSPHATE 4 MG/ML IJ SOLN
INTRAMUSCULAR | Status: AC
  Filled 2015-04-12: qty 1

## 2015-04-12 MED ORDER — PROPOFOL 10 MG/ML IV BOLUS
INTRAVENOUS | Status: AC
  Filled 2015-04-12: qty 20

## 2015-04-12 MED ORDER — ONDANSETRON HCL 4 MG/2ML IJ SOLN
INTRAMUSCULAR | Status: AC
  Filled 2015-04-12: qty 2

## 2015-04-12 MED ORDER — EPHEDRINE SULFATE 50 MG/ML IJ SOLN
INTRAMUSCULAR | Status: DC | PRN
  Administered 2015-04-12: 5 mg via INTRAVENOUS

## 2015-04-12 MED ORDER — MIDAZOLAM HCL 2 MG/2ML IJ SOLN
INTRAMUSCULAR | Status: AC
  Filled 2015-04-12: qty 4

## 2015-04-12 MED ORDER — FENTANYL CITRATE (PF) 100 MCG/2ML IJ SOLN
INTRAMUSCULAR | Status: DC | PRN
Start: 2015-04-12 — End: 2015-04-12
  Administered 2015-04-12 (×2): 50 ug via INTRAVENOUS

## 2015-04-12 MED ORDER — EPHEDRINE SULFATE 50 MG/ML IJ SOLN
INTRAMUSCULAR | Status: AC
  Filled 2015-04-12: qty 1

## 2015-04-12 MED ORDER — LIDOCAINE HCL (CARDIAC) 20 MG/ML IV SOLN
INTRAVENOUS | Status: DC | PRN
  Administered 2015-04-12: 60 mg via INTRAVENOUS

## 2015-04-12 MED ORDER — MIDAZOLAM HCL 2 MG/2ML IJ SOLN
INTRAMUSCULAR | Status: DC | PRN
  Administered 2015-04-12: 2 mg via INTRAVENOUS

## 2015-04-12 MED ORDER — LIDOCAINE HCL (CARDIAC) 20 MG/ML IV SOLN
INTRAVENOUS | Status: AC
Start: 2015-04-12 — End: 2015-04-12
  Filled 2015-04-12: qty 5

## 2015-04-12 MED ORDER — SODIUM CHLORIDE 0.9 % IJ SOLN
INTRAMUSCULAR | Status: AC
  Filled 2015-04-12: qty 10

## 2015-04-12 MED ORDER — OXYCODONE-ACETAMINOPHEN 5-325 MG PO TABS: 1.0000 | ORAL_TABLET | Freq: Four times a day (QID) | ORAL | Status: DC | PRN

## 2015-04-12 MED ORDER — ONDANSETRON HCL 4 MG/2ML IJ SOLN
INTRAMUSCULAR | Status: DC | PRN
  Administered 2015-04-12: 4 mg via INTRAVENOUS

## 2015-04-12 MED ORDER — FENTANYL CITRATE (PF) 100 MCG/2ML IJ SOLN
25.0000 ug | INTRAMUSCULAR | Status: DC | PRN
  Administered 2015-04-12: 50 ug via INTRAVENOUS

## 2015-04-12 MED ORDER — ACETAMINOPHEN 160 MG/5ML PO SOLN
975.0000 mg | Freq: Once | ORAL | Status: AC
  Administered 2015-04-12: 975 mg via ORAL

## 2015-04-12 MED ORDER — SCOPOLAMINE 1 MG/3DAYS TD PT72
MEDICATED_PATCH | TRANSDERMAL | Status: AC
  Administered 2015-04-12: 1.5 mg via TRANSDERMAL
  Filled 2015-04-12: qty 1

## 2015-04-12 SURGICAL SUPPLY — 17 items
CANISTER SUCT 3000ML (MISCELLANEOUS) ×3 IMPLANT
CATH ROBINSON RED A/P 16FR (CATHETERS) ×3 IMPLANT
CLOTH BEACON ORANGE TIMEOUT ST (SAFETY) ×3 IMPLANT
CONTAINER PREFILL 10% NBF 60ML (FORM) ×6 IMPLANT
ELECT REM PT RETURN 9FT ADLT (ELECTROSURGICAL) ×3
ELECTRODE REM PT RTRN 9FT ADLT (ELECTROSURGICAL) ×1 IMPLANT
GLOVE BIOGEL PI IND STRL 7.0 (GLOVE) ×2 IMPLANT
GLOVE BIOGEL PI INDICATOR 7.0 (GLOVE) ×4
GLOVE ECLIPSE 6.5 STRL STRAW (GLOVE) ×3 IMPLANT
GOWN STRL REUS W/TWL LRG LVL3 (GOWN DISPOSABLE) ×6 IMPLANT
LOOP ANGLED CUTTING 22FR (CUTTING LOOP) IMPLANT
PACK VAGINAL MINOR WOMEN LF (CUSTOM PROCEDURE TRAY) ×3 IMPLANT
PAD OB MATERNITY 4.3X12.25 (PERSONAL CARE ITEMS) ×3 IMPLANT
TOWEL OR 17X24 6PK STRL BLUE (TOWEL DISPOSABLE) ×6 IMPLANT
TUBING AQUILEX INFLOW (TUBING) ×3 IMPLANT
TUBING AQUILEX OUTFLOW (TUBING) ×3 IMPLANT
WATER STERILE IRR 1000ML POUR (IV SOLUTION) ×3 IMPLANT

## 2015-04-12 NOTE — Discharge Instructions (Signed)
CALL  IF TEMP>100.4, NOTHING PER VAGINA X 1 WK, CALL IF SOAKING A MAXI  PAD EVERY HOUR OR MORE FREQUENTLY 

## 2015-04-12 NOTE — Anesthesia Procedure Notes (Signed)
Procedure Name: LMA Insertion Date/Time: 04/12/2015 1:45 PM Performed by: Raenette Rover Pre-anesthesia Checklist: Patient identified, Patient being monitored, Emergency Drugs available and Suction available Patient Re-evaluated:Patient Re-evaluated prior to inductionOxygen Delivery Method: Circle system utilized Preoxygenation: Pre-oxygenation with 100% oxygen Intubation Type: IV induction Ventilation: Mask ventilation without difficulty LMA: LMA inserted LMA Size: 3.0 Number of attempts: 1 Placement Confirmation: positive ETCO2,  CO2 detector and breath sounds checked- equal and bilateral Tube secured with: Tape Dental Injury: Teeth and Oropharynx as per pre-operative assessment

## 2015-04-12 NOTE — Brief Op Note (Signed)
04/12/2015  2:35 PM  PATIENT:  Brittany Evans  59 y.o. female  PRE-OPERATIVE DIAGNOSIS:  Postmenopausal Bleeding, Endometrial Mass, Endometrial Thickening on ultrasound  POST-OPERATIVE DIAGNOSIS:  Postmenopausal bleeding, endometrial polyps, submucosal fibroids, endometrial thickening on ultrasound  PROCEDURE:  Diagnostic hysteroscopy, hysteroscopic resection of submucosal fibroids, endometrial polypectomy, dilation and curettage  SURGEON:  Surgeon(s) and Role:    * Servando Salina, MD - Primary  PHYSICIAN ASSISTANT:   ASSISTANTS: none  ANESTHESIA:   general Findings: multiple submucosal fibroids, endometrial polyp,  EBL:  Total I/O In: 700 [I.V.:700] Out: 80 [Urine:75; Blood:5]  BLOOD ADMINISTERED:none  DRAINS: none  LOCAL MEDICATIONS USED:  none  SPECIMEN:  emc fibroid resections, polyp  DISPOSITION OF SPECIMEN:  pathology  COUNTS:  yes  TOURNIQUET:  * No tourniquets in log *  DICTATION: .Other Dictation: Dictation Number [id number]  PLAN OF CARE: home  PATIENT DISPOSITION:  Home from PACU   Delay start of Pharmacological VTE agent (>24hrs) due to surgical blood loss or risk of bleeding:  no

## 2015-04-12 NOTE — Transfer of Care (Signed)
Immediate Anesthesia Transfer of Care Note  Patient: Brittany Evans  Procedure(s) Performed: Procedure(s):  DIAGNOSTIC HYSTEROSCOPY WITH RESECTION OF FIBROID AND POLYPS (N/A) DILATATION AND CURETTAGE (N/A)  Patient Location: PACU  Anesthesia Type:General  Level of Consciousness: awake, alert , oriented and patient cooperative  Airway & Oxygen Therapy: Patient Spontanous Breathing and Patient connected to nasal cannula oxygen  Post-op Assessment: Report given to RN and Post -op Vital signs reviewed and stable  Post vital signs: Reviewed and stable  Last Vitals:  Filed Vitals:   04/12/15 1433  BP:   Pulse:   Temp: 37.3 C  Resp:     Complications: No apparent anesthesia complications

## 2015-04-12 NOTE — Anesthesia Preprocedure Evaluation (Addendum)
Anesthesia Evaluation  Patient identified by MRN, date of birth, ID band Patient awake    Reviewed: Allergy & Precautions, H&P , NPO status , Patient's Chart, lab work & pertinent test results  Airway Mallampati: III  TM Distance: >3 FB Neck ROM: Full    Dental  (+) Chipped, Missing,    Pulmonary asthma ,  breath sounds clear to auscultation  Pulmonary exam normal       Cardiovascular hypertension, Pt. on medications and Pt. on home beta blockers + CAD, + Past MI and +CHF Normal cardiovascular examRhythm:Regular Rate:Normal  NSTEMI 09/2010 Chronic non ischemic systolic CHF Last EF 25% in 2012 on Echo Diffuse hypokinesis   Neuro/Psych negative neurological ROS  negative psych ROS   GI/Hepatic negative GI ROS, Neg liver ROS,   Endo/Other  diabetes, Well Controlled, Type 2, Insulin DependentObesity Hyperlipidemia  Renal/GU Renal disease  negative genitourinary   Musculoskeletal negative musculoskeletal ROS (+)   Abdominal (+) + obese,   Peds  Hematology  (+) anemia ,   Anesthesia Other Findings   Reproductive/Obstetrics Post menopausal bleeding Uterine fibroids                            Anesthesia Physical  Anesthesia Plan  ASA: III  Anesthesia Plan: General   Post-op Pain Management:    Induction: Intravenous  Airway Management Planned: LMA  Additional Equipment:   Intra-op Plan:   Post-operative Plan: Extubation in OR  Informed Consent: I have reviewed the patients History and Physical, chart, labs and discussed the procedure including the risks, benefits and alternatives for the proposed anesthesia with the patient or authorized representative who has indicated his/her understanding and acceptance.   Dental Advisory Given and Dental advisory given  Plan Discussed with: CRNA and Surgeon  Anesthesia Plan Comments: (Discussed GA with LMA, possible sore throat, potential  need to switch to ETT, N/V, pulmonary aspiration. Questions answered. )        Anesthesia Quick Evaluation                                  Anesthesia Evaluation  Patient identified by MRN, date of birth, ID band Patient awake    Reviewed: Allergy & Precautions, H&P , NPO status , Patient's Chart, lab work & pertinent test results  Airway Mallampati: III  TM Distance: >3 FB Neck ROM: Full    Dental  (+) Chipped, Missing,    Pulmonary asthma ,  breath sounds clear to auscultation  Pulmonary exam normal       Cardiovascular hypertension, Pt. on medications and Pt. on home beta blockers + CAD, + Past MI and +CHF Rhythm:Regular Rate:Normal  NSTEMI 09/2010 Chronic non ischemic systolic CHF Last EF 42% in 2012 on Echo Diffuse hypokinesis   Neuro/Psych negative neurological ROS  negative psych ROS   GI/Hepatic negative GI ROS, Neg liver ROS,   Endo/Other  diabetes, Well Controlled, Type 2, Insulin DependentObesity Hyperlipidemia  Renal/GU Renal disease  negative genitourinary   Musculoskeletal negative musculoskeletal ROS (+)   Abdominal (+) + obese,   Peds  Hematology  (+) anemia ,   Anesthesia Other Findings   Reproductive/Obstetrics Post menopausal bleeding Uterine fibroids                            Anesthesia Physical Anesthesia Plan  ASA: III  Anesthesia Plan: General   Post-op Pain Management:    Induction: Intravenous  Airway Management Planned: LMA  Additional Equipment:   Intra-op Plan:   Post-operative Plan: Extubation in OR  Informed Consent: I have reviewed the patients History and Physical, chart, labs and discussed the procedure including the risks, benefits and alternatives for the proposed anesthesia with the patient or authorized representative who has indicated his/her understanding and acceptance.   Dental advisory given  Plan Discussed with: CRNA, Anesthesiologist and  Surgeon  Anesthesia Plan Comments:         Anesthesia Quick Evaluation

## 2015-04-12 NOTE — Anesthesia Postprocedure Evaluation (Signed)
  Anesthesia Post-op Note  Patient: Brittany Evans  Procedure(s) Performed: Procedure(s) (LRB):  DIAGNOSTIC HYSTEROSCOPY WITH RESECTION OF FIBROID AND POLYPS (N/A) DILATATION AND CURETTAGE (N/A)  Patient Location: PACU  Anesthesia Type: General  Level of Consciousness: awake and alert   Airway and Oxygen Therapy: Patient Spontanous Breathing  Post-op Pain: none  Post-op Assessment: Post-op Vital signs reviewed, Patient's Cardiovascular Status Stable, Respiratory Function Stable, Patent Airway and No signs of Nausea or vomiting  Last Vitals:  Filed Vitals:   04/12/15 1433  BP: 163/83  Pulse: 50  Temp: 37.3 C  Resp:     Post-op Vital Signs: stable   Complications: No apparent anesthesia complications Will take home BP meds when gets home, no pain

## 2015-04-13 ENCOUNTER — Encounter (HOSPITAL_COMMUNITY): Payer: Self-pay | Admitting: Obstetrics and Gynecology

## 2015-04-13 NOTE — Progress Notes (Signed)
   04/13/15 1759  OBSTRUCTIVE SLEEP APNEA  Have you ever been diagnosed with sleep apnea through a sleep study? No  Do you snore loudly (loud enough to be heard through closed doors)?  1  Do you often feel tired, fatigued, or sleepy during the daytime? 1  Has anyone observed you stop breathing during your sleep? 0  Do you have, or are you being treated for high blood pressure? 1  BMI more than 35 kg/m2? 1  Age over 58 years old? 1  Neck circumference greater than 40 cm/16 inches? 1  Gender: 0  Obstructive Sleep Apnea Score 6

## 2015-04-13 NOTE — Op Note (Signed)
NAMECASSARA, Brittany Evans NO.:  [id number]  MEDICAL RECORD NO.:  [id number]  LOCATION:  [facility]                          FACILITY:  [facility]  PHYSICIAN:  Servando Salina, M.D.DATE OF BIRTH:  December 15, 1955  DATE OF PROCEDURE:  04/12/2015 DATE OF DISCHARGE:  04/12/2015                              OPERATIVE REPORT   PREOPERATIVE DIAGNOSIS:  Persistent postmenopausal bleeding, endometrial mass, endometrial thickening on ultrasound, fibroid uterus.  PROCEDURE:  Diagnostic hysteroscopy, hysteroscopic resection of submucosal fibroids, endometrial polypectomy, dilation and curettage.  POSTOPERATIVE DIAGNOSIS:  Submucosal fibroid, endometrial polyp, persistent postmenopausal bleeding, fibroid uterus.  ANESTHESIA:  General.  SURGEON:  Servando Salina, M.D.  ASSISTANT:  None.  DESCRIPTION OF PROCEDURE:  Under adequate general anesthesia, the patient was placed in the dorsal lithotomy position.  She was sterilely prepped and draped in the usual fashion.  The bladder was catheterized for a small amount of urine.  Examination under anesthesia revealed an irregular shape 10-week size uterus.  No adnexal masses could be appreciated.  A bivalve speculum was placed in the vagina.  A single- tooth tenaculum was placed on the anterior lip of the cervix.  The cervix was then serially dilated up to #[facility] dilator.  A glycine- primed resectoscope was introduced into the uterine cavity.  On entering the cavity, it was noted that the both tubal ostia could be seen.  There was a polypoid lesion in the left anterior aspect of the uterus and 2 other small submucosal fibroids.  All of which were then resected.  The resectoscope removed.  The cavity was gently curetted.  Re-insertion of the hysteroscope revealed additional submucosal fibroids that had not been seen on the initial inspection.  These were subsequently resected and a particular left lateral submucosal fibroid with the  removal of the resectoscope and then reinserting it, the submucosal fibroid became more prominent at which time, additional resection was then performed. Similar event was noted anteriorly, however,  the fluid deficit exchange had revealed that the fluid deficit was more, then one would prefer at 1700 mL.  Ultimately, the fluid deficit correction was 900, but the resectoscope was once again reinserted and any additional fibroids that could be resected, was done.  This procedure was then terminated by removing all instruments from the vagina.  SPECIMEN:  Labeled endometrial curetting with fibroid resection and endometrial polyps was sent to Pathology.  BLOOD LOSS:  Less than 10 mL.  URINE LOSS:  75 mL.  FLUID IN:  700 mL.  COUNTS:  Sponge and instrument counts x2 was correct.  COMPLICATION:  None.  The patient tolerated the procedure well and was transferred to recovery in stable condition.     Servando Salina, M.D.     [place]/MEDQ  D:  04/12/2015  T:  04/13/2015  Job:  [id number]

## 2015-06-21 ENCOUNTER — Encounter: Payer: Self-pay | Admitting: *Deleted

## 2015-12-04 ENCOUNTER — Other Ambulatory Visit: Payer: Self-pay

## 2015-12-04 DIAGNOSIS — Z1231 Encounter for screening mammogram for malignant neoplasm of breast: Secondary | ICD-10-CM

## 2015-12-17 ENCOUNTER — Ambulatory Visit
Admission: RE | Admit: 2015-12-17 | Discharge: 2015-12-17 | Disposition: A | Discharge status: Home / Self Care | Payer: Medicare HMO | Source: Ambulatory Visit

## 2015-12-17 DIAGNOSIS — Z1231 Encounter for screening mammogram for malignant neoplasm of breast: Secondary | ICD-10-CM

## 2016-11-20 ENCOUNTER — Encounter (HOSPITAL_COMMUNITY): Payer: Self-pay | Admitting: Emergency Medicine

## 2016-11-20 ENCOUNTER — Emergency Department (HOSPITAL_COMMUNITY)
Admission: EM | Admit: 2016-11-20 | Discharge: 2016-11-20 | Disposition: A | Discharge status: Home / Self Care | Payer: Medicare HMO | Attending: Emergency Medicine | Admitting: Emergency Medicine

## 2016-11-20 DIAGNOSIS — H9202 Otalgia, left ear: Secondary | ICD-10-CM | POA: Diagnosis present

## 2016-11-20 DIAGNOSIS — Z7984 Long term (current) use of oral hypoglycemic drugs: Secondary | ICD-10-CM | POA: Insufficient documentation

## 2016-11-20 DIAGNOSIS — Z79899 Other long term (current) drug therapy: Secondary | ICD-10-CM | POA: Insufficient documentation

## 2016-11-20 DIAGNOSIS — N183 Chronic kidney disease, stage 3 (moderate): Secondary | ICD-10-CM | POA: Insufficient documentation

## 2016-11-20 DIAGNOSIS — I130 Hypertensive heart and chronic kidney disease with heart failure and stage 1 through stage 4 chronic kidney disease, or unspecified chronic kidney disease: Secondary | ICD-10-CM | POA: Insufficient documentation

## 2016-11-20 DIAGNOSIS — Z7982 Long term (current) use of aspirin: Secondary | ICD-10-CM | POA: Insufficient documentation

## 2016-11-20 DIAGNOSIS — I5022 Chronic systolic (congestive) heart failure: Secondary | ICD-10-CM | POA: Insufficient documentation

## 2016-11-20 DIAGNOSIS — H60502 Unspecified acute noninfective otitis externa, left ear: Secondary | ICD-10-CM | POA: Insufficient documentation

## 2016-11-20 DIAGNOSIS — I252 Old myocardial infarction: Secondary | ICD-10-CM | POA: Diagnosis not present

## 2016-11-20 DIAGNOSIS — E1122 Type 2 diabetes mellitus with diabetic chronic kidney disease: Secondary | ICD-10-CM | POA: Insufficient documentation

## 2016-11-20 DIAGNOSIS — I2510 Atherosclerotic heart disease of native coronary artery without angina pectoris: Secondary | ICD-10-CM | POA: Insufficient documentation

## 2016-11-20 MED ORDER — BENZONATATE 100 MG PO CAPS: 200.0000 mg | ORAL_CAPSULE | Freq: Three times a day (TID) | ORAL | 0 refills | Status: DC | PRN

## 2016-11-20 MED ORDER — CIPROFLOXACIN-DEXAMETHASONE 0.3-0.1 % OT SUSP: 4.0000 [drp] | Freq: Two times a day (BID) | OTIC | 0 refills | Status: DC

## 2016-11-20 NOTE — ED Provider Notes (Signed)
[place] DEPT Provider Note   CSN: [id number] Arrival date & time: 11/20/16  1242     History   Chief Complaint Chief Complaint  Patient presents with  . Otalgia  . Cough    HPI Brittany Evans is a 61 y.o. female.  Patient presents with 6 day history of cough, congestion, sinus pressure and ear pain. States her L ear "popped" last night and now it has been draining clear liquid and some blood. States she was seen at PCP when symptoms began and was told that it was viral infection and to continue supportive care. She continued to cough with reported hemoptysis so she went to [facility] to get CXR done which was negative. States the hemoptysis has resolved now but cough persists, especially when she lays down. Felt chills on and off. Also reports runny nose. Symptoms slowly improving with Nyquil but now she is worried about her ear. Denies chest pain, trouble breathing, leg swelling, recent prolonged travel, palpitations, purulent ear drainage, history of similar symptoms prior.      Past Medical History:  Diagnosis Date  . Asthma    inhaler use as needed rarely  . CAD (coronary artery disease)    NSTEMI 2/12: LAD 30%, D1 40%, oCFX 70-80%, mRCA 10-20%, ?occl. of apical LAD; treated medically  . CHF (congestive heart failure) ([place])   . Chronic kidney disease    stage 3 chronic kidney disease- pt states she is asymptomatic  . Complication of anesthesia    Lightheaded for several days  . Diabetes mellitus   . Dysrhythmia   . Hyperlipidemia   . Hypertension   . Myocardial infarction   . Neuromuscular disorder (HCC)    neuropathy due to DM  . NICM (nonischemic cardiomyopathy) ([place])    EF 20-25%, 45% 2012  . Obesity   . Peripheral vascular disease ([place])   . Systolic CHF [facility])     Patient Active Problem List   Diagnosis Date Noted  . Plantar fasciitis of left foot 08/03/2014  . Fibroids 05/22/2014  . Postmenopausal bleeding 01/19/2014  . Diabetes ([place]) 01/02/2014    . HTN (hypertension) 01/02/2014  . Colon cancer screening 01/02/2014  . Type II or unspecified type diabetes mellitus with unspecified complication, uncontrolled 04/29/2013  . CKD (chronic kidney disease) stage 3, GFR 30-59 ml/min 03/18/2013  . Dyslipidemia 03/11/2013  . SYSTOLIC HEART FAILURE, CHRONIC 10/21/2010  . CAD, NATIVE VESSEL 10/18/2010  . HYPERTENSION 01/18/2007    Past Surgical History:  Procedure Laterality Date  . COLONOSCOPY    . DILATATION & CURETTAGE/HYSTEROSCOPY WITH MYOSURE N/A 07/18/2014   Procedure: DILATATION & CURETTAGE/HYSTEROSCOPY WITH MYOSURE;  Surgeon: Woodroe Mode, MD;  Location: [place] [place] [facility];  Service: Gynecology;  Laterality: N/A;  . DILATATION & CURRETTAGE/HYSTEROSCOPY WITH RESECTOCOPE N/A 04/12/2015   Procedure:  DIAGNOSTIC HYSTEROSCOPY WITH RESECTION OF FIBROID AND POLYPS;  Surgeon: Servando Salina, MD;  Location: [facility];  Service: Gynecology;  Laterality: N/A;  . DILATION AND CURETTAGE OF UTERUS N/A 04/12/2015   Procedure: DILATATION AND CURETTAGE;  Surgeon: Servando Salina, MD;  Location: [place] [facility];  Service: Gynecology;  Laterality: N/A;  . NO PAST SURGERIES    . None    . POLYPECTOMY N/A 07/18/2014   Procedure: POLYPECTOMY;  Surgeon: Woodroe Mode, MD;  Location: [place] [facility];  Service: Gynecology;  Laterality: N/A;  . WISDOM TOOTH EXTRACTION      OB History    Gravida Para Term Preterm AB Living   5 2 1 1 3  2  SAB TAB Ectopic Multiple Live Births     3     2       Home Medications    Prior to Admission medications   Medication Sig Start Date End Date Taking? Authorizing Provider  albuterol (PROAIR HFA) 108 (90 BASE) MCG/ACT inhaler Inhale 2 puffs into the lungs every 6 (six) hours as needed. Shortness of breath 12/30/13   Tresa Garter, MD  aspirin 81 MG tablet Take 1 tablet (81 mg total) by mouth daily. 04/29/13   Robbie Lis, MD  benzonatate (TESSALON) 100 MG capsule Take 2 capsules (200 mg total) by mouth 3 (three) times daily as needed  for cough. 11/20/16   Shanieka Blea, PA-C  canagliflozin (INVOKANA) 100 MG TABS tablet Take 100 mg by mouth daily.    Historical Provider, MD  carvedilol (COREG) 12.5 MG tablet Take 12.5 mg by mouth daily.    Historical Provider, MD  ciprofloxacin-dexamethasone (CIPRODEX) otic suspension Place 4 drops into the left ear 2 (two) times daily. 11/20/16   Kong Packett, PA-C  metFORMIN (GLUCOPHAGE) 500 MG tablet Take by mouth 2 (two) times daily with a meal.    Historical Provider, MD  Multiple Vitamin (MULTI-VITAMINS) TABS TAKE 1 TABLET BY MOUTH ONCE DAILY 09/06/13   Robbie Lis, MD  nitroGLYCERIN (NITROSTAT) 0.4 MG SL tablet Place 1 tablet (0.4 mg total) under the tongue every 5 (five) minutes as needed. Chest pain 03/27/14   Tresa Garter, MD  oxyCODONE-acetaminophen (ROXICET) 5-325 MG per tablet Take 1 tablet by mouth every 6 (six) hours as needed for severe pain. 04/12/15   Servando Salina, MD  simvastatin (ZOCOR) 40 MG tablet Take 1 tablet (40 mg total) by mouth at bedtime. 01/04/14   Tresa Garter, MD  spironolactone (ALDACTONE) 50 MG tablet Take 50 mg by mouth daily.    Historical Provider, MD    Family History Family History  Problem Relation Age of Onset  . Cancer Mother   . Diabetes Mother   . Coronary artery disease Mother   . Heart disease Mother   . Cirrhosis Father     Alcohol abuse  . Alcohol abuse Father   . Heart disease Father   . Cancer Sister     lung  . Lung disease Sister   . Pancreatic cancer Maternal Grandmother   . Colon cancer Neg Hx   . Rectal cancer Neg Hx   . Stomach cancer Neg Hx     Social History Social History  Substance Use Topics  . Smoking status: Never Smoker  . Smokeless tobacco: Never Used  . Alcohol use No     Allergies   Patient has no known allergies.   Review of Systems Review of Systems  Constitutional: Positive for chills. Negative for appetite change and fever.  HENT: Positive for ear discharge, ear pain, rhinorrhea, sinus  pain, sneezing and sore throat.   Eyes: Negative for photophobia and visual disturbance.  Respiratory: Positive for cough. Negative for chest tightness, shortness of breath and wheezing.   Cardiovascular: Negative for chest pain and palpitations.  Gastrointestinal: Positive for diarrhea. Negative for abdominal pain, blood in stool, constipation, nausea and vomiting.  Genitourinary: Negative for dysuria, hematuria and urgency.  Musculoskeletal: Negative for myalgias.  Skin: Negative for rash.  Neurological: Negative for dizziness, weakness and light-headedness.     Physical Exam Updated Vital Signs BP (!) 124/92 (BP Location: Right Arm)   Pulse (!) 101   Temp 99.1 F (37.3 C) (  Oral)   Resp 18   SpO2 97%   Physical Exam  Constitutional: She appears well-developed and well-nourished. No distress.  HENT:  Head: Normocephalic and atraumatic.  Right Ear: External ear normal. No drainage. Tympanic membrane is not scarred.  Left Ear: There is drainage (Thick white discharge). Tympanic membrane is scarred. Tympanic membrane is not erythematous and not bulging.  Nose: Nose normal. Right sinus exhibits no maxillary sinus tenderness and no frontal sinus tenderness. Left sinus exhibits no maxillary sinus tenderness and no frontal sinus tenderness.  Mouth/Throat: Oropharynx is clear and moist and mucous membranes are normal. No tonsillar exudate.  Poor visualization of the L TM but some scarring seen.  Eyes: Conjunctivae and EOM are normal. Pupils are equal, round, and reactive to light. Right eye exhibits no discharge. Left eye exhibits no discharge. No scleral icterus.  Neck: Normal range of motion. Neck supple.  Cardiovascular: Normal rate, regular rhythm, normal heart sounds and intact distal pulses.   Pulmonary/Chest: Effort normal and breath sounds normal. No respiratory distress.  Abdominal: Soft. Bowel sounds are normal. She exhibits no distension. There is no tenderness. There is no  guarding.  Musculoskeletal: Normal range of motion. She exhibits no edema.  Lymphadenopathy:    She has no cervical adenopathy.  Neurological: She is alert. She exhibits normal muscle tone. Coordination normal.  Skin: Skin is warm and dry. No rash noted. She is not diaphoretic.  Psychiatric: She has a normal mood and affect.  Nursing note and vitals reviewed.    ED Treatments / Results  Labs (all labs ordered are listed, but only abnormal results are displayed) Labs Reviewed - No data to display  EKG  EKG Interpretation None       Radiology No results found.  Procedures Procedures (including critical care time)  Medications Ordered in ED Medications - No data to display   Initial Impression / Assessment and Plan / ED Course  I have reviewed the triage vital signs and the nursing notes.  Pertinent labs & imaging results that were available during my care of the patient were reviewed by me and considered in my medical decision making (see chart for details).     Patient's history and symptoms concerning for perforated TM vs. Otitis externa vs. Otitis media vs. Viral URI. Chart review revealed that CXR done 2 days ago showed no acute pulmonary process. Patient's ear appeared scarred with discharge noted. No pain with palpation of tragus and no deformities noted but there is white discharge on exam. States bleeding has stopped. No evidence of middle ear effusion, erythema or bulging of TM. Although there is no tenderness with movement of ear, physical exam findings concerning for otitis externa. Since patient is diabetic, will treat with Ciprodex x7 days. Patient continues to have cough so will give Tessalon perles. Advised to follow up with PCP. Return precautions given.  Final Clinical Impressions(s) / ED Diagnoses   Final diagnoses:  Left ear pain  Acute otitis externa of left ear, unspecified type    New Prescriptions New Prescriptions   BENZONATATE (TESSALON)  100 MG CAPSULE    Take 2 capsules (200 mg total) by mouth 3 (three) times daily as needed for cough.   CIPROFLOXACIN-DEXAMETHASONE (CIPRODEX) OTIC SUSPENSION    Place 4 drops into the left ear 2 (two) times daily.     Delia Heady, PA-C 11/20/16 [place], MD 11/24/16 [phone]

## 2016-11-20 NOTE — ED Triage Notes (Signed)
Patient reports that she was seen by PCP last Friday and was told has viral infection and will have to run its course. Patient went to [facility] to have chest xrays.  Patient reports that she has lot of pressure in left ear and last night had drainage from it. Patient reports cough that is productive

## 2016-11-20 NOTE — Discharge Instructions (Signed)
Begin using Ciprodex drops twice daily for 7 days. Take Tessalon Perles as needed for cough. Follow up with PCP. Return to ED for worsening headache, pain behind hear, fever, vision changes.

## 2016-11-24 ENCOUNTER — Emergency Department (HOSPITAL_COMMUNITY)
Admission: EM | Admit: 2016-11-24 | Discharge: 2016-11-24 | Disposition: A | Discharge status: Home / Self Care | Payer: Medicare HMO | Attending: Emergency Medicine | Admitting: Emergency Medicine

## 2016-11-24 ENCOUNTER — Encounter (HOSPITAL_COMMUNITY): Payer: Self-pay | Admitting: Emergency Medicine

## 2016-11-24 DIAGNOSIS — I5022 Chronic systolic (congestive) heart failure: Secondary | ICD-10-CM | POA: Diagnosis not present

## 2016-11-24 DIAGNOSIS — I130 Hypertensive heart and chronic kidney disease with heart failure and stage 1 through stage 4 chronic kidney disease, or unspecified chronic kidney disease: Secondary | ICD-10-CM | POA: Diagnosis not present

## 2016-11-24 DIAGNOSIS — Z79899 Other long term (current) drug therapy: Secondary | ICD-10-CM | POA: Diagnosis not present

## 2016-11-24 DIAGNOSIS — H6501 Acute serous otitis media, right ear: Secondary | ICD-10-CM | POA: Insufficient documentation

## 2016-11-24 DIAGNOSIS — I2510 Atherosclerotic heart disease of native coronary artery without angina pectoris: Secondary | ICD-10-CM | POA: Insufficient documentation

## 2016-11-24 DIAGNOSIS — H9210 Otorrhea, unspecified ear: Secondary | ICD-10-CM | POA: Diagnosis present

## 2016-11-24 DIAGNOSIS — Z7982 Long term (current) use of aspirin: Secondary | ICD-10-CM | POA: Diagnosis not present

## 2016-11-24 DIAGNOSIS — N183 Chronic kidney disease, stage 3 (moderate): Secondary | ICD-10-CM | POA: Diagnosis not present

## 2016-11-24 DIAGNOSIS — H6591 Unspecified nonsuppurative otitis media, right ear: Secondary | ICD-10-CM

## 2016-11-24 DIAGNOSIS — H7291 Unspecified perforation of tympanic membrane, right ear: Secondary | ICD-10-CM

## 2016-11-24 DIAGNOSIS — E1140 Type 2 diabetes mellitus with diabetic neuropathy, unspecified: Secondary | ICD-10-CM | POA: Diagnosis not present

## 2016-11-24 DIAGNOSIS — H6690 Otitis media, unspecified, unspecified ear: Secondary | ICD-10-CM | POA: Diagnosis not present

## 2016-11-24 LAB — RAPID STREP SCREEN (MED CTR MEBANE ONLY): Streptococcus, Group A Screen (Direct): NEGATIVE

## 2016-11-24 MED ORDER — CIPROFLOXACIN-DEXAMETHASONE 0.3-0.1 % OT SUSP: 4.0000 [drp] | Freq: Two times a day (BID) | OTIC | 0 refills | Status: DC

## 2016-11-24 MED ORDER — AMOXICILLIN 500 MG PO CAPS: 500.0000 mg | ORAL_CAPSULE | Freq: Three times a day (TID) | ORAL | 0 refills | Status: DC

## 2016-11-24 NOTE — ED Triage Notes (Signed)
Pt was here Thursday of last week with a cold and left ear drainage.  Today she comes back with complaints of the right ear now draining and hurting. Endorses some hearing difficulty.  A&O x4.  Ambulatory. States she was prescribed ciprodex for the left ear and is still having some drainage.

## 2016-11-24 NOTE — ED Provider Notes (Signed)
[facility] DEPT Provider Note   CSN: [id number] Arrival date & time: 11/24/16  1539   By signing my name below, I, Brittany Evans, attest that this documentation has been prepared under the direction and in the presence of [name], PA-C. Electronically Signed: Eunice Evans, Scribe. 11/24/16. 6:41 PM.   History   Chief Complaint Chief Complaint  Patient presents with  . Sore Throat  . Ear Drainage   The history is provided by the patient and medical records. No language interpreter was used.    Brittany Evans is a 61 y.o. female with h/o HTN, CAD, CKD, HLD and DM, self transported, who presents to the Emergency Department with concern for R ear drainage onset last night. She notes associated hearing difficulty, persistent L ear drainage and sore throat. Pt seen in [facility] ED on 11/20/2016 with blood tinged L ear drainage after hearing it "pop" the night before and having URI symptoms x 6 days leading up to this. Pt prescribed tessalon and ciprofloxacin at this time with relief in her L ear drainage. She denies rhinorrhea, ear pain and fever, or mastoid tenderness.  Past Medical History:  Diagnosis Date  . Asthma    inhaler use as needed rarely  . CAD (coronary artery disease)    NSTEMI 2/12: LAD 30%, D1 40%, oCFX 70-80%, mRCA 10-20%, ?occl. of apical LAD; treated medically  . CHF (congestive heart failure) ([place])   . Chronic kidney disease    stage 3 chronic kidney disease- pt states she is asymptomatic  . Complication of anesthesia    Lightheaded for several days  . Diabetes mellitus   . Dysrhythmia   . Hyperlipidemia   . Hypertension   . Myocardial infarction   . Neuromuscular disorder (HCC)    neuropathy due to DM  . NICM (nonischemic cardiomyopathy) ([place])    EF 20-25%, 45% 2012  . Obesity   . Peripheral vascular disease ([place])   . Systolic CHF [facility])     Patient Active Problem List   Diagnosis Date Noted  . Plantar fasciitis of left foot 08/03/2014  .  Fibroids 05/22/2014  . Postmenopausal bleeding 01/19/2014  . Diabetes ([place]) 01/02/2014  . HTN (hypertension) 01/02/2014  . Colon cancer screening 01/02/2014  . Type II or unspecified type diabetes mellitus with unspecified complication, uncontrolled 04/29/2013  . CKD (chronic kidney disease) stage 3, GFR 30-59 ml/min 03/18/2013  . Dyslipidemia 03/11/2013  . SYSTOLIC HEART FAILURE, CHRONIC 10/21/2010  . CAD, NATIVE VESSEL 10/18/2010  . HYPERTENSION 01/18/2007    Past Surgical History:  Procedure Laterality Date  . COLONOSCOPY    . DILATATION & CURETTAGE/HYSTEROSCOPY WITH MYOSURE N/A 07/18/2014   Procedure: DILATATION & CURETTAGE/HYSTEROSCOPY WITH MYOSURE;  Surgeon: Woodroe Mode, MD;  Location: [place] [facility];  Service: Gynecology;  Laterality: N/A;  . DILATATION & CURRETTAGE/HYSTEROSCOPY WITH RESECTOCOPE N/A 04/12/2015   Procedure:  DIAGNOSTIC HYSTEROSCOPY WITH RESECTION OF FIBROID AND POLYPS;  Surgeon: Servando Salina, MD;  Location: [facility];  Service: Gynecology;  Laterality: N/A;  . DILATION AND CURETTAGE OF UTERUS N/A 04/12/2015   Procedure: DILATATION AND CURETTAGE;  Surgeon: Servando Salina, MD;  Location: [facility];  Service: Gynecology;  Laterality: N/A;  . NO PAST SURGERIES    . None    . POLYPECTOMY N/A 07/18/2014   Procedure: POLYPECTOMY;  Surgeon: Woodroe Mode, MD;  Location: [facility];  Service: Gynecology;  Laterality: N/A;  . WISDOM TOOTH EXTRACTION      OB History    Gravida Para  Term Preterm AB Living   5 2 1 1 3 2    SAB TAB Ectopic Multiple Live Births     3     2       Home Medications    Prior to Admission medications   Medication Sig Start Date End Date Taking? Authorizing Provider  albuterol (PROAIR HFA) 108 (90 BASE) MCG/ACT inhaler Inhale 2 puffs into the lungs every 6 (six) hours as needed. Shortness of breath 12/30/13   Tresa Garter, MD  aspirin 81 MG tablet Take 1 tablet (81 mg total) by mouth daily. 04/29/13   Robbie Lis, MD  benzonatate  (TESSALON) 100 MG capsule Take 2 capsules (200 mg total) by mouth 3 (three) times daily as needed for cough. 11/20/16   Hina Khatri, PA-C  canagliflozin (INVOKANA) 100 MG TABS tablet Take 100 mg by mouth daily.    Historical Provider, MD  carvedilol (COREG) 12.5 MG tablet Take 12.5 mg by mouth daily.    Historical Provider, MD  ciprofloxacin-dexamethasone (CIPRODEX) otic suspension Place 4 drops into the left ear 2 (two) times daily. 11/20/16   Hina Khatri, PA-C  metFORMIN (GLUCOPHAGE) 500 MG tablet Take by mouth 2 (two) times daily with a meal.    Historical Provider, MD  Multiple Vitamin (MULTI-VITAMINS) TABS TAKE 1 TABLET BY MOUTH ONCE DAILY 09/06/13   Robbie Lis, MD  nitroGLYCERIN (NITROSTAT) 0.4 MG SL tablet Place 1 tablet (0.4 mg total) under the tongue every 5 (five) minutes as needed. Chest pain 03/27/14   Tresa Garter, MD  oxyCODONE-acetaminophen (ROXICET) 5-325 MG per tablet Take 1 tablet by mouth every 6 (six) hours as needed for severe pain. 04/12/15   Servando Salina, MD  simvastatin (ZOCOR) 40 MG tablet Take 1 tablet (40 mg total) by mouth at bedtime. 01/04/14   Tresa Garter, MD  spironolactone (ALDACTONE) 50 MG tablet Take 50 mg by mouth daily.    Historical Provider, MD    Family History Family History  Problem Relation Age of Onset  . Cancer Mother   . Diabetes Mother   . Coronary artery disease Mother   . Heart disease Mother   . Cirrhosis Father     Alcohol abuse  . Alcohol abuse Father   . Heart disease Father   . Cancer Sister     lung  . Lung disease Sister   . Pancreatic cancer Maternal Grandmother   . Colon cancer Neg Hx   . Rectal cancer Neg Hx   . Stomach cancer Neg Hx     Social History Social History  Substance Use Topics  . Smoking status: Never Smoker  . Smokeless tobacco: Never Used  . Alcohol use No     Allergies   Patient has no known allergies.   Review of Systems Review of Systems  Constitutional: Negative for fever.    HENT: Positive for ear discharge, hearing loss and sore throat. Negative for ear pain and rhinorrhea.      Physical Exam Updated Vital Signs BP 121/87 (BP Location: Right Arm)   Pulse 70   Temp 97.9 F (36.6 C) (Oral)   Resp 18   Ht 5\' 5"  (1.651 m)   Wt 212 lb (96.2 kg)   SpO2 100%   BMI 35.28 kg/m   Physical Exam  Constitutional: She is oriented to person, place, and time. She appears well-developed and well-nourished. No distress.  HENT:  Head: Normocephalic and atraumatic.  Right Ear: Hearing normal.  Left Ear:  Hearing, external ear and ear canal normal.  Nose: Nose normal.  Mouth/Throat: Uvula is midline, oropharynx is clear and moist and mucous membranes are normal. No oropharyngeal exudate, posterior oropharyngeal edema, posterior oropharyngeal erythema or tonsillar abscesses. No tonsillar exudate.  Poor visualization of the right tm. Mild edema of the right ear canal with purulent drainage noted. Likley tm rupture. No mastoid tenderness bilaterally without erythema or edema. No pain with auricle or tragus manipulation. No auricular lymphadenopathy.   Eyes: Conjunctivae are normal. Pupils are equal, round, and reactive to light. Right eye exhibits no discharge. Left eye exhibits no discharge. No scleral icterus.  Neck: Normal range of motion. Neck supple. No JVD present. No tracheal deviation present.  Pulmonary/Chest: Effort normal. No stridor.  Neurological: She is alert and oriented to person, place, and time. Coordination normal.  Psychiatric: She has a normal mood and affect. Her behavior is normal. Judgment and thought content normal.  Nursing note and vitals reviewed.    ED Treatments / Results  DIAGNOSTIC STUDIES: Oxygen Saturation is 100% on RA, normal by my interpretation.    COORDINATION OF CARE: 6:39 PM Discussed treatment plan with pt at bedside and pt agreed to plan. Will Rx medication and refer to ENMT specialist.  Labs (all labs ordered are listed,  but only abnormal results are displayed) Labs Reviewed  RAPID STREP SCREEN (NOT AT [facility])    EKG  EKG Interpretation None       Radiology No results found.  Procedures Procedures (including critical care time)  Medications Ordered in ED Medications - No data to display   Initial Impression / Assessment and Plan / ED Course  I have reviewed the triage vital signs and the nursing notes.  Pertinent labs & imaging results that were available during my care of the patient were reviewed by me and considered in my medical decision making (see chart for details).     Pt present with right ear drainage. Denies pain. Strep test negative. Oropharynx show no signs of pta or deep neck infection. Purulent drainage noted on exam of the right ear. Likely perforated TM vs otitis media vs otitis externa. Pt is a diabetic. Treated with abx drops last week for left ear pain with improvement. No fever. No mastoid tenderness concerning for mastoiditis. Doubt malignant otitis externa. Will treat with oral abx and abd drops to prevent otitis externa. Pt encouraged to avoid getting water in the ear. Non toxic appearing. VS stable. Encouraged to follow up with ENT. Pt given strict return precautions. Pt is hemodynamically stable, in NAD, & able to ambulate in the ED. Pt has no complaints prior to dc. Pt is comfortable with above plan and is stable for discharge at this time. All questions were answered prior to disposition. Strict return precautions for f/u to the ED were discussed.     Final Clinical Impressions(s) / ED Diagnoses   Final diagnoses:  Acute otitis media, unspecified otitis media type  Otitis media, serous, tm rupture, right    New Prescriptions Discharge Medication List as of 11/24/2016  6:44 PM    START taking these medications   Details  amoxicillin (AMOXIL) 500 MG capsule Take 1 capsule (500 mg total) by mouth 3 (three) times daily., Starting Mon 11/24/2016, Print       I  personally performed the services described in this documentation, which was scribed in my presence. The recorded information has been reviewed and is accurate.    [name], PA-C 11/25/16 0010  Duffy Bruce, MD 11/26/16 1331

## 2016-11-24 NOTE — Discharge Instructions (Signed)
Avoid getting water into your ear. He to follow up with an ENT doctor. Use the drops in the right ear as prescribed. Take antibiotics as prescribed. Motrin and Tylenol for pain.

## 2016-11-27 LAB — CULTURE, GROUP A STREP (THRC)

## 2016-12-09 ENCOUNTER — Other Ambulatory Visit: Payer: Self-pay | Admitting: Specialist

## 2016-12-09 DIAGNOSIS — Z1231 Encounter for screening mammogram for malignant neoplasm of breast: Secondary | ICD-10-CM

## 2016-12-09 IMAGING — US US PELVIS COMPLETE
1 series · 14 of 25 positions shown · non-contrast
Comparison: 05/30/2014

CLINICAL DATA: Fibroids

EXAM:
TRANSABDOMINAL AND TRANSVAGINAL ULTRASOUND OF PELVIS
TECHNIQUE: Both transabdominal and transvaginal ultrasound examinations of the
pelvis were performed. Transabdominal technique was performed for
global imaging of the pelvis including uterus, ovaries, adnexal
regions, and pelvic cul-de-sac. It was necessary to proceed with
endovaginal exam following the transabdominal exam to visualize the
endometrium and ovary.

[Series 1: us pelvis complete · 14 of 47 slices shown]
[im 1/47]
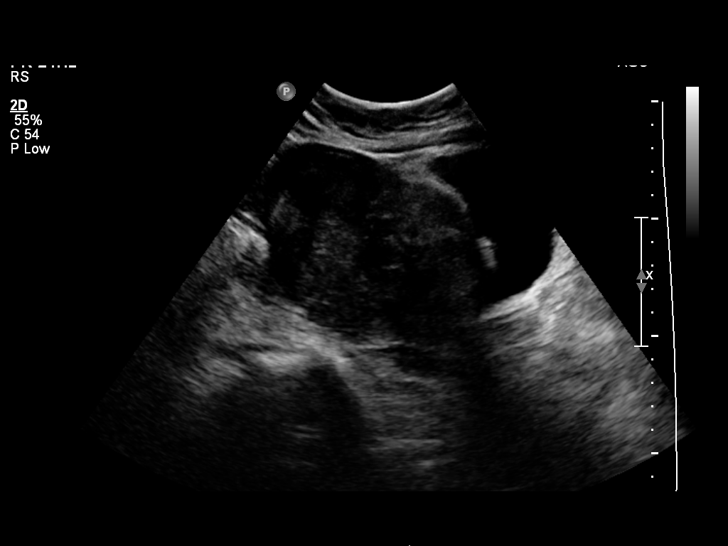
[im 4/47]
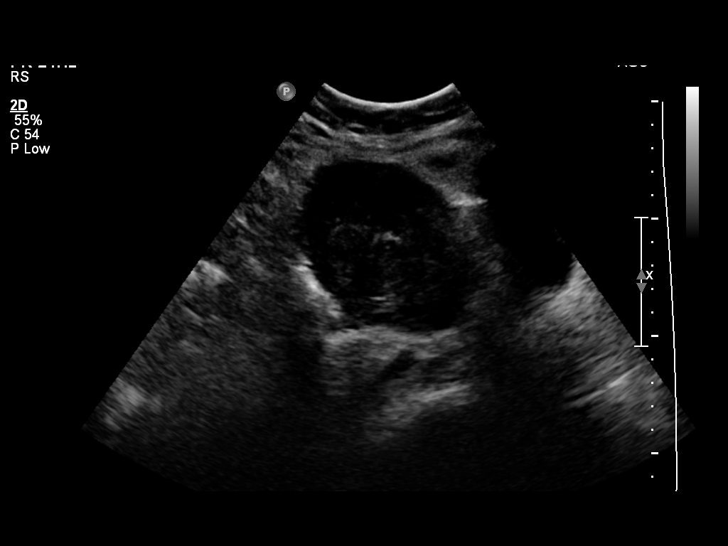
[im 8/47]
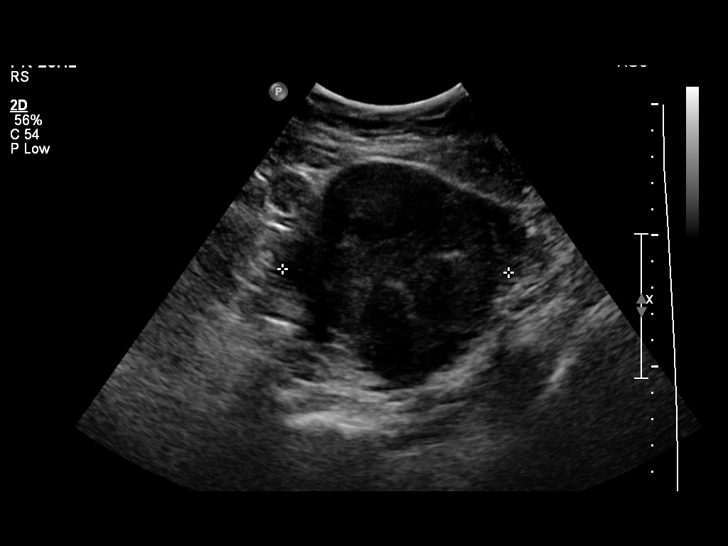
[im 12/47]
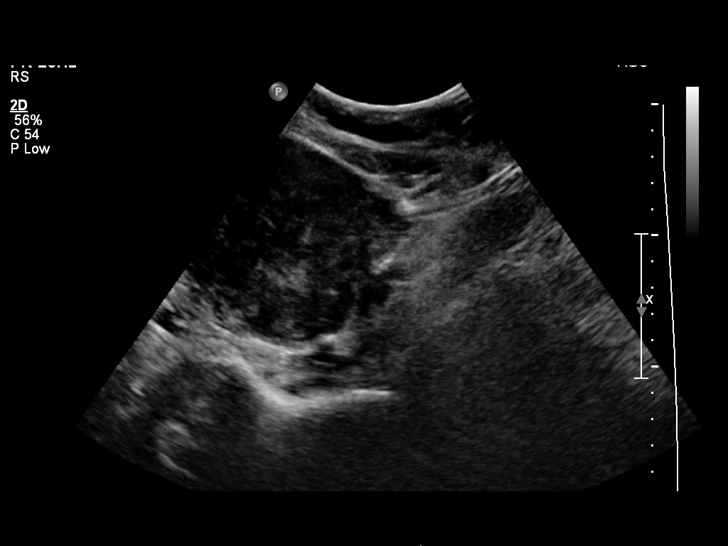
[im 16/47]
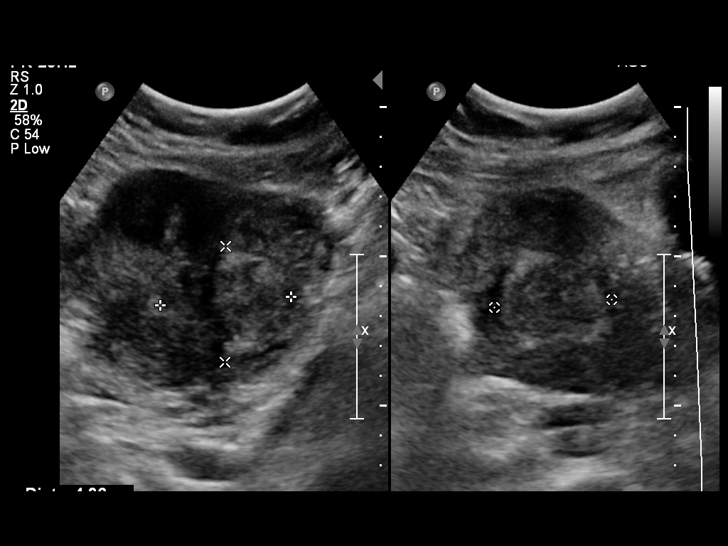
[im 18/47]
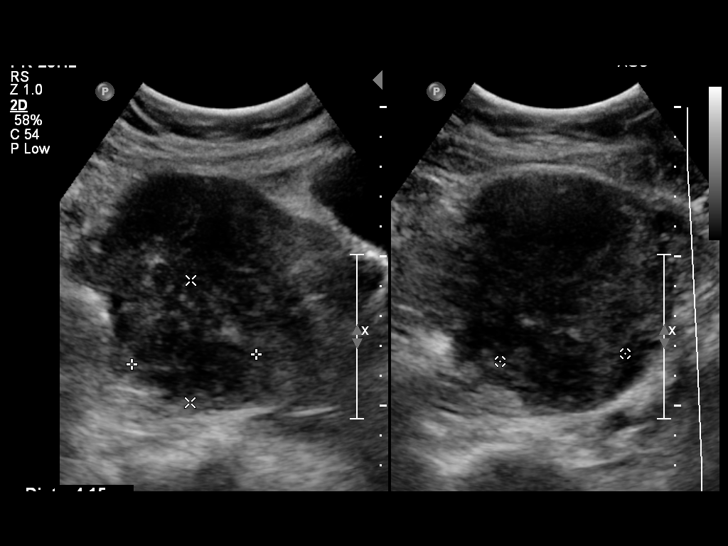
[im 22/47]
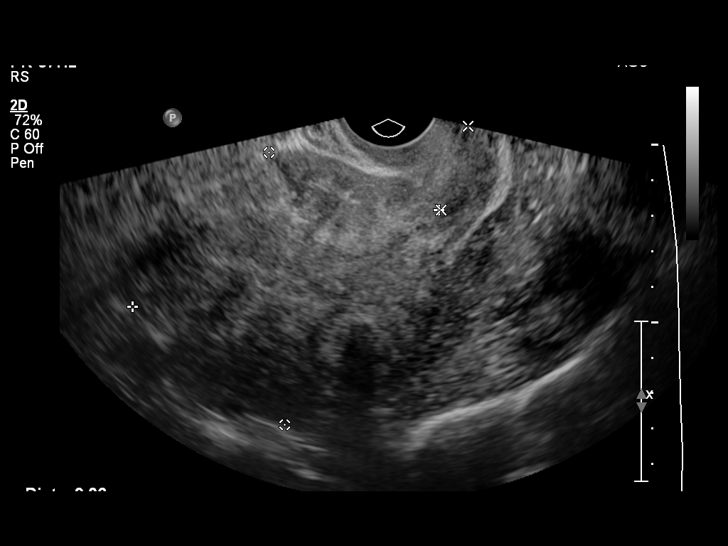
[im 25/47]
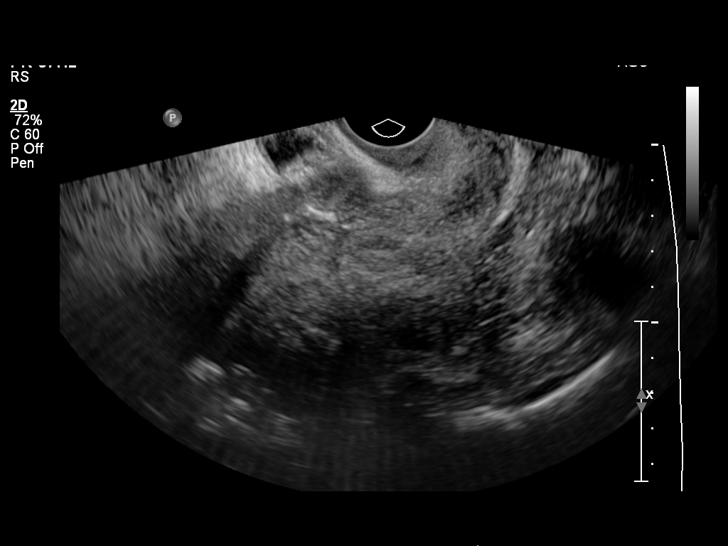
[im 29/47]
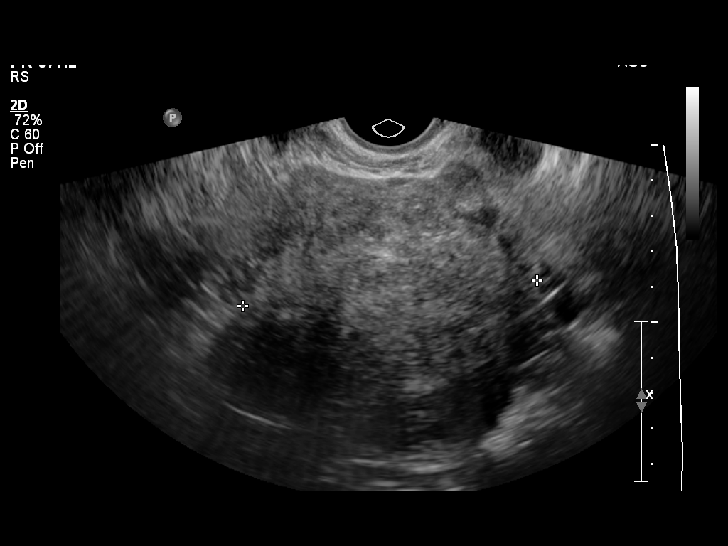
[im 31/47]
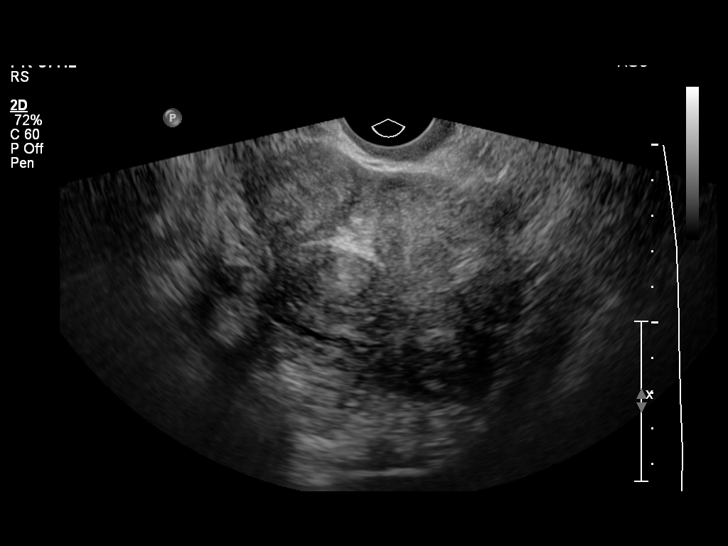
[im 35/47]
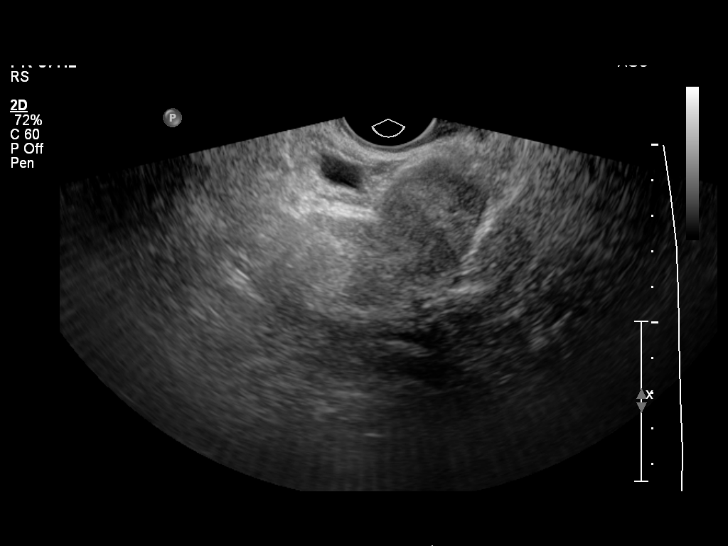
[im 39/47]
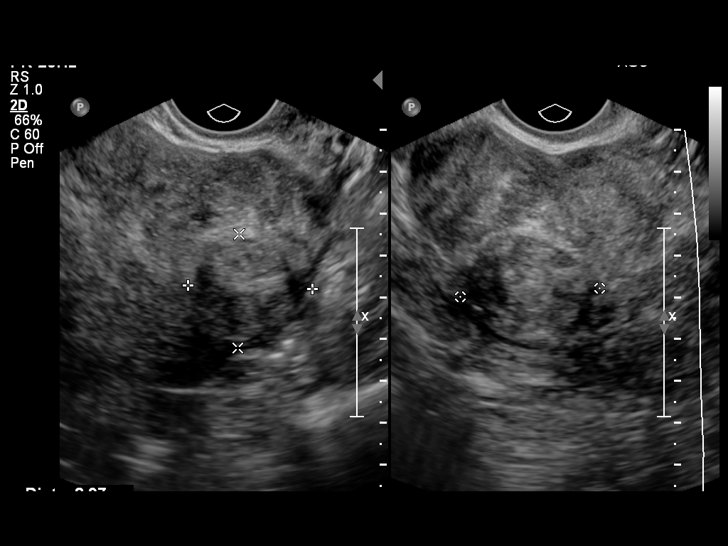
[im 43/47]
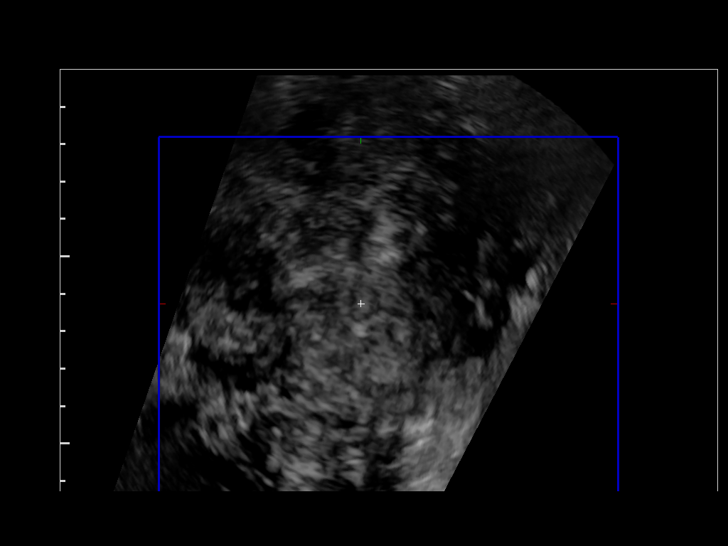
[im 47/47]
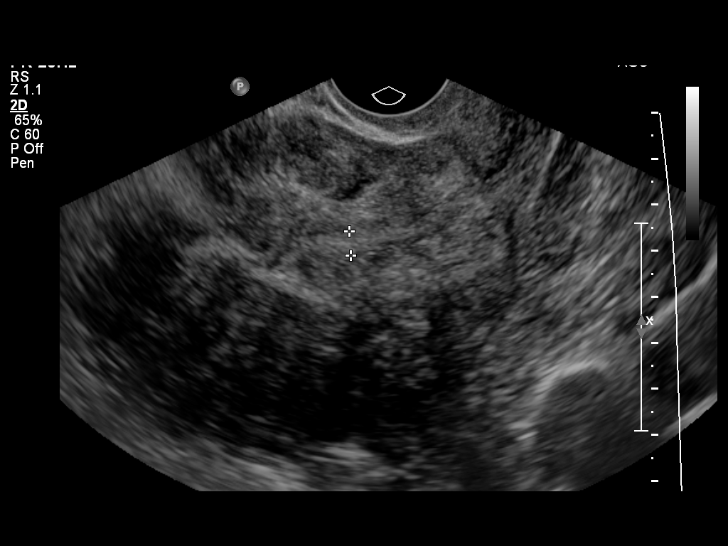

[14 of 25 positions shown; findings below may reference images not displayed]

FINDINGS: Uterus

Measurements: 12.8 x 7.9 x 8.6 cm. Multiple fibroids again noted.
Left fundal fibroid measures 4.8 x 5.0 x 3.7 cm. Left mid uterine
fibroid posteriorly measures 4.2 x 4.2 x 4.0 cm. Left lower uterus
segment fibroid measures 4.4 x 3.9 x 3.9 cm.

Endometrium

Thickness: 7 mm in thickness where visualized. Difficult to
visualize due to fibroids.

Right ovary

Measurements: Not visualized. No adnexal masses seen.

Left ovary

Measurements: Not visualize. No adnexal masses seen.

Other findings

No free fluid.
IMPRESSION: Multiple uterine fibroids with enlarged uterus. Overall, no
significant change since prior study.

## 2017-01-02 ENCOUNTER — Ambulatory Visit
Admission: RE | Admit: 2017-01-02 | Discharge: 2017-01-02 | Disposition: A | Discharge status: Home / Self Care | Payer: Medicare HMO | Source: Ambulatory Visit | Attending: Specialist | Admitting: Specialist

## 2017-01-02 DIAGNOSIS — Z1231 Encounter for screening mammogram for malignant neoplasm of breast: Secondary | ICD-10-CM

## 2017-11-26 ENCOUNTER — Other Ambulatory Visit: Payer: Self-pay | Admitting: Nurse Practitioner

## 2017-11-26 DIAGNOSIS — Z1231 Encounter for screening mammogram for malignant neoplasm of breast: Secondary | ICD-10-CM

## 2017-12-28 NOTE — Progress Notes (Addendum)
[facility] Note  12/29/2017     CHIEF COMPLAINT Patient presents for Retina Evaluation and Diabetic Eye Exam   HISTORY OF PRESENT ILLNESS: Brittany Evans is a 62 y.o. female who presents to the clinic today for:   HPI    Retina Evaluation    Associated Symptoms Floaters.  Negative for Flashes, Pain, Trauma, Fever, Weight Loss, Scalp Tenderness, Redness, Distortion, Photophobia, Jaw Claudication, Fatigue, Blind Spot, Glare and Shoulder/Hip pain.  Context:  distance vision, mid-range vision and near vision.  Treatments tried include no treatments.  I, the attending physician,  performed the HPI with the patient and updated documentation appropriately.          Diabetic Eye Exam    Vision is stable.  Associated Symptoms Negative for Blind Spot, Glare, Shoulder/Hip pain, Fatigue, Jaw Claudication, Photophobia, Distortion, Floaters, Redness, Scalp Tenderness, Weight Loss, Fever, Trauma, Pain and Flashes.  Diabetes characteristics include Type 2 and taking oral medications.  This started 14 years ago.  Blood sugar level is controlled.  Last Blood Glucose 97.  Last A1C 6.1.  Associated Diagnosis Kidney Disease.  I, the attending physician,  performed the HPI with the patient and updated documentation appropriately.          Comments    Referral of [name] for CME OD. Patient states [name]  told her she  had fluid behind her eye(unsure eye), she occasionally has floaters Os, denies ocular pain and light sensitivity. Pt has been DM2 since 2005, Bs this am 97, A1C 6.1 ( 4 mos ago) Bs controlled by Metformin, dosage has been decreased three weeks ago, Bs have been [facility]. Pt denies eye gtt's /Vit's         Last edited by Bernarda Caffey, MD on 12/29/2017  2:40 PM. (History)    Pt states she sees [name] for routine exam; Pt states he "saw something and wanted me to be seen by a specialist"; Pt states she is diabetic and there was a time about 3  years ago where Dm was uncontrolled x 1.5 years; Pt states CBG is now well controlled;   Referring physician: [name], MD [id number], [place] [address]  HISTORICAL INFORMATION:   Selected notes from the MEDICAL RECORD NUMBER Referred by Dr. Thurston Hole for concern of diabetic macular edema LEE: 05.03.19 ([name]) [BCVA: OD: 20/20 OS: 20/20 Ocular Hx-DME PMH-DM (Metformin), Rheumatoid Arthritis, Asthma, HTN,      CURRENT MEDICATIONS: No current outpatient medications on file. (Ophthalmic Drugs)   No current facility-administered medications for this visit.  (Ophthalmic Drugs)   Current Outpatient Medications (Other)  Medication Sig  . albuterol (PROAIR HFA) 108 (90 BASE) MCG/ACT inhaler Inhale 2 puffs into the lungs every 6 (six) hours as needed. Shortness of breath  . aspirin 81 MG tablet Take 1 tablet (81 mg total) by mouth daily.  . carvedilol (COREG) 12.5 MG tablet Take 12.5 mg by mouth daily.  . metFORMIN (GLUCOPHAGE) 500 MG tablet Take by mouth 2 (two) times daily with a meal.  . Multiple Vitamin (MULTI-VITAMINS) TABS TAKE 1 TABLET BY MOUTH ONCE DAILY  . nitroGLYCERIN (NITROSTAT) 0.4 MG SL tablet Place 1 tablet (0.4 mg total) under the tongue every 5 (five) minutes as needed. Chest pain  . simvastatin (ZOCOR) 40 MG tablet Take 1 tablet (40 mg total) by mouth at bedtime.  Marland Kitchen spironolactone (ALDACTONE) 50 MG tablet Take 50 mg by mouth daily.  Marland Kitchen amoxicillin (  AMOXIL) 500 MG capsule Take 1 capsule (500 mg total) by mouth 3 (three) times daily. (Patient not taking: Reported on 12/29/2017)  . benzonatate (TESSALON) 100 MG capsule Take 2 capsules (200 mg total) by mouth 3 (three) times daily as needed for cough. (Patient not taking: Reported on 12/29/2017)  . canagliflozin (INVOKANA) 100 MG TABS tablet Take 100 mg by mouth daily.  . ciprofloxacin-dexamethasone (CIPRODEX) otic suspension Place 4 drops into the right ear 2 (two) times daily. (Patient not taking:  Reported on 12/29/2017)  . oxyCODONE-acetaminophen (ROXICET) 5-325 MG per tablet Take 1 tablet by mouth every 6 (six) hours as needed for severe pain. (Patient not taking: Reported on 12/29/2017)   No current facility-administered medications for this visit.  (Other)      REVIEW OF SYSTEMS: ROS    Positive for: Skin, Genitourinary, Endocrine, Cardiovascular, Eyes, Respiratory   Negative for: Constitutional, Gastrointestinal, Neurological, Musculoskeletal, HENT, Psychiatric, Allergic/Imm, Heme/Lymph   Last edited by Zenovia Jordan, LPN on 8/52/7782  4:23 PM. (History)       ALLERGIES No Known Allergies  PAST MEDICAL HISTORY Past Medical History:  Diagnosis Date  . Asthma    inhaler use as needed rarely  . CAD (coronary artery disease)    NSTEMI 2/12: LAD 30%, D1 40%, oCFX 70-80%, mRCA 10-20%, ?occl. of apical LAD; treated medically  . CHF (congestive heart failure) ([place])   . Chronic kidney disease    stage 3 chronic kidney disease- pt states she is asymptomatic  . Complication of anesthesia    Lightheaded for several days  . Diabetes mellitus   . Dysrhythmia   . Hyperlipidemia   . Hypertension   . Myocardial infarction ([place])   . Neuromuscular disorder (HCC)    neuropathy due to DM  . NICM (nonischemic cardiomyopathy) ([place])    EF 20-25%, 45% 2012  . Obesity   . Peripheral vascular disease ([place])   . Systolic CHF [facility])    Past Surgical History:  Procedure Laterality Date  . COLONOSCOPY    . DILATATION & CURETTAGE/HYSTEROSCOPY WITH MYOSURE N/A 07/18/2014   Procedure: DILATATION & CURETTAGE/HYSTEROSCOPY WITH MYOSURE;  Surgeon: Woodroe Mode, MD;  Location: [facility];  Service: Gynecology;  Laterality: N/A;  . DILATATION & CURRETTAGE/HYSTEROSCOPY WITH RESECTOCOPE N/A 04/12/2015   Procedure:  DIAGNOSTIC HYSTEROSCOPY WITH RESECTION OF FIBROID AND POLYPS;  Surgeon: Servando Salina, MD;  Location: [facility];  Service: Gynecology;  Laterality: N/A;  . DILATION AND CURETTAGE OF  UTERUS N/A 04/12/2015   Procedure: DILATATION AND CURETTAGE;  Surgeon: Servando Salina, MD;  Location: [facility];  Service: Gynecology;  Laterality: N/A;  . NO PAST SURGERIES    . None    . POLYPECTOMY N/A 07/18/2014   Procedure: POLYPECTOMY;  Surgeon: Woodroe Mode, MD;  Location: [facility];  Service: Gynecology;  Laterality: N/A;  . WISDOM TOOTH EXTRACTION      FAMILY HISTORY Family History  Problem Relation Age of Onset  . Cancer Mother   . Diabetes Mother   . Coronary artery disease Mother   . Heart disease Mother   . Cirrhosis Father        Alcohol abuse  . Alcohol abuse Father   . Heart disease Father   . Cancer Sister        lung  . Lung disease Sister   . Pancreatic cancer Maternal Grandmother   . Breast cancer Cousin   . Colon cancer Neg Hx   . Rectal cancer Neg Hx   . Stomach  cancer Neg Hx     SOCIAL HISTORY Social History   Tobacco Use  . Smoking status: Never Smoker  . Smokeless tobacco: Never Used  Substance Use Topics  . Alcohol use: No  . Drug use: No         OPHTHALMIC EXAM:  Base Eye Exam    Visual Acuity (Snellen - Linear)      Right Left   Dist cc 20/20 20/70   Dist ph cc  20/25 -2   Correction:  Glasses       Tonometry (Tonopen, 2:09 PM)      Right Left   Pressure 17 15       Pupils      Dark Light Shape React APD   Right 3 1.5 Round Brisk None   Left 3 1.5 Round Brisk None       Visual Fields (Counting fingers)      Left Right    Full Full       Extraocular Movement      Right Left    Full, Ortho Full, Ortho       Neuro/Psych    Oriented x3:  Yes   Mood/Affect:  Normal       Dilation    Both eyes:  1.0% Mydriacyl, 2.5% Phenylephrine @ 2:09 PM        Slit Lamp and Fundus Exam    Slit Lamp Exam      Right Left   Lids/Lashes Dermatochalasis - upper lid Dermatochalasis - upper lid   Conjunctiva/Sclera White and quiet White and quiet   Cornea Arcus Arcus   Anterior Chamber Deep and quiet Deep and quiet   Iris  Round and dilated, No NVI Round and dilated, No NVI   Lens 2+ Nuclear sclerosis, 2+ Cortical cataract 2+ Nuclear sclerosis, 2+ Cortical cataract   Vitreous Vitreous syneresis Vitreous syneresis       Fundus Exam      Right Left   Disc Pink and Sharp Pink and Sharp   C/D Ratio 0.2 0.2   Macula Good foveal reflex, mild RPE mottling and clumping, trace Microaneurysms, temporal cystic change Good foveal reflex, No heme or edema   Vessels Normal, no NV Normal, no NV   Periphery Attached, patches of mild Inferior and infero-temporal Lattice degeneration - no holes or breaks Attached, mild Inferior Lattice degeneration -- no breaks        Refraction    Wearing Rx      Sphere Cylinder Axis Add   Right -2.50 +3.25 091 +2.00   Left -2.00 +3.50 118 +2.00       Manifest Refraction      Sphere Cylinder Axis Dist VA   Right       Left -1.00 +4.00 140 20/70          IMAGING AND PROCEDURES  Imaging and Procedures for @TODAY @  OCT, Retina - OU - Both Eyes       Right Eye Quality was good. Central Foveal Thickness: 216. Progression has no prior data. Findings include normal foveal contour, intraretinal fluid, no SRF (Mild cystic change temporal fovea, PVD).   Left Eye Quality was good. Central Foveal Thickness: 234. Progression has no prior data. Findings include normal foveal contour, no IRF, no SRF, vitreomacular adhesion .   Notes *Images captured and stored on drive  Diagnosis / Impression:  OD: mild temporal cystic change OS: NFP, No IRF/SRF  Clinical management:  See below  Abbreviations: NFP -  Normal foveal profile. CME - cystoid macular edema. PED - pigment epithelial detachment. IRF - intraretinal fluid. SRF - subretinal fluid. EZ - ellipsoid zone. ERM - epiretinal membrane. ORA - outer retinal atrophy. ORT - outer retinal tubulation. SRHM - subretinal hyper-reflective material         Fluorescein Angiography Optos (Transit OD)       Right Eye Progression has  no prior data. Early phase findings include microaneurysm. Mid/Late phase findings include microaneurysm, leakage.   Left Eye Progression has no prior data. Early phase findings include microaneurysm. Mid/Late phase findings include leakage, microaneurysm.   Notes Images stored on drive;   Impression: Mild non-proliferative diabetic retinopathy OU OD: few microaneurysm with minimal leakage -- none corresponding to cystic changes on OCT; focal late peripheral perivascular leakage inferotemporal quadrant OS: rare microaneurysm with minimal leakage; focal late peripheral perivascular leakage temporal quadrant                  ASSESSMENT/PLAN:    ICD-10-CM   1. Mild nonproliferative diabetic retinopathy of both eyes without macular edema associated with type 2 diabetes mellitus (HCC) B63.8937 Fluorescein Angiography Optos (Transit OD)  2. Retinal edema H35.81 OCT, Retina - OU - Both Eyes  3. 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 Essential hypertension I10   5. Hypertensive retinopathy of both eyes H35.033 Fluorescein Angiography Optos (Transit OD)  6. Combined forms of age-related cataract of both eyes H25.813     1,2. Mild nonproliferative diabetic retinopathy w/o DME, both eyes - The incidence, risk factors for progression, natural history and treatment options for diabetic retinopathy were discussed with patient.   - The need for close monitoring of blood glucose, blood pressure, and serum lipids, avoiding cigarette or any type of tobacco, and the need for long term follow up was also discussed with patient. - exam with rare MAs OU - FA with scattered MAS OU, no neovascularization - OCT with trace cystic changes OD, no macular edema OS - BCVA remains 20/20 OD, 20/25- OS - discussed findings and prognosis and treatment options including observation - recommend observation for now - f/u in 2-3 mos -- repeat DFE and OCT  3. Lattice Degeneration OU-  - mild  patches inferiorly OU - no holes or tears  - stable - monitor  4,5. Hypertensive retinopathy OU - discussed importance of tight BP control - monitor  6. Combined form age-related cataract OU-  - The symptoms of cataract, surgical options, and treatments and risks were discussed with patient. - discussed diagnosis and progression - not yet visually significant - monitor for now   Ophthalmic Meds Ordered this visit:  No orders of the defined types were placed in this encounter.      Return in about 3 months (around 03/31/2018) for F/U .  There are no Patient Instructions on file for this visit.   Explained the diagnoses, plan, and follow up with the patient and they expressed understanding.  Patient expressed understanding of the importance of proper follow up care.   This document serves as a record of services personally performed by Gardiner Sleeper, MD, PhD. It was created on their behalf by Ernest Mallick, OA, an ophthalmic assistant. The creation of this record is the provider's dictation and/or activities during the visit.    Electronically signed by: Ernest Mallick, OA  12/28/2017 10:44 PM   This document serves as a record of services personally performed by Gardiner Sleeper, MD, PhD. It was created on  their behalf by Catha Brow, [place], a certified ophthalmic assistant. The creation of this record is the provider's dictation and/or activities during the visit.  Electronically signed by: Catha Brow, COA  05.14.19 10:44 PM    Gardiner Sleeper, M.D., Ph.D. Diseases & Surgery of the Retina and [place] 05.14.19  I have reviewed the above documentation for accuracy and completeness, and I agree with the above. Gardiner Sleeper, M.D., Ph.D. 12/29/17 10:44 PM     Abbreviations: M myopia (nearsighted); A astigmatism; H hyperopia (farsighted); P presbyopia; Mrx spectacle prescription;  CTL contact lenses; OD right eye; OS left eye; OU  both eyes  XT exotropia; ET esotropia; PEK punctate epithelial keratitis; PEE punctate epithelial erosions; DES dry eye syndrome; MGD meibomian gland dysfunction; ATs artificial tears; PFAT's preservative free artificial tears; [place] nuclear sclerotic cataract; PSC posterior subcapsular cataract; ERM epi-retinal membrane; PVD posterior vitreous detachment; RD retinal detachment; DM diabetes mellitus; DR diabetic retinopathy; NPDR non-proliferative diabetic retinopathy; PDR proliferative diabetic retinopathy; CSME clinically significant macular edema; DME diabetic macular edema; dbh dot blot hemorrhages; CWS cotton wool spot; POAG primary open angle glaucoma; C/D cup-to-disc ratio; HVF humphrey visual field; GVF goldmann visual field; OCT optical coherence tomography; IOP intraocular pressure; BRVO Branch retinal vein occlusion; CRVO central retinal vein occlusion; CRAO central retinal artery occlusion; BRAO branch retinal artery occlusion; RT retinal tear; SB scleral buckle; PPV pars plana vitrectomy; VH Vitreous hemorrhage; PRP panretinal laser photocoagulation; IVK intravitreal kenalog; VMT vitreomacular traction; MH Macular hole;  NVD neovascularization of the disc; NVE neovascularization elsewhere; AREDS age related eye disease study; ARMD age related macular degeneration; POAG primary open angle glaucoma; EBMD epithelial/anterior basement membrane dystrophy; ACIOL anterior chamber intraocular lens; IOL intraocular lens; PCIOL posterior chamber intraocular lens; Phaco/IOL phacoemulsification with intraocular lens placement; [place] photorefractive keratectomy; LASIK laser assisted in situ keratomileusis; HTN hypertension; DM diabetes mellitus; COPD chronic obstructive pulmonary disease

## 2017-12-29 ENCOUNTER — Encounter (INDEPENDENT_AMBULATORY_CARE_PROVIDER_SITE_OTHER): Payer: Self-pay | Admitting: Ophthalmology

## 2017-12-29 ENCOUNTER — Ambulatory Visit (INDEPENDENT_AMBULATORY_CARE_PROVIDER_SITE_OTHER): Payer: Medicare HMO | Admitting: Ophthalmology

## 2017-12-29 DIAGNOSIS — I10 Essential (primary) hypertension: Secondary | ICD-10-CM | POA: Diagnosis not present

## 2017-12-29 DIAGNOSIS — H3581 Retinal edema: Secondary | ICD-10-CM

## 2017-12-29 DIAGNOSIS — H35033 Hypertensive retinopathy, bilateral: Secondary | ICD-10-CM | POA: Diagnosis not present

## 2017-12-29 DIAGNOSIS — E113293 Type 2 diabetes mellitus with mild nonproliferative diabetic retinopathy without macular edema, bilateral: Secondary | ICD-10-CM

## 2017-12-29 DIAGNOSIS — H25813 Combined forms of age-related cataract, bilateral: Secondary | ICD-10-CM | POA: Diagnosis not present

## 2017-12-29 DIAGNOSIS — H35413 Lattice degeneration of retina, bilateral: Secondary | ICD-10-CM | POA: Diagnosis not present

## 2018-01-04 ENCOUNTER — Ambulatory Visit
Admission: RE | Admit: 2018-01-04 | Discharge: 2018-01-04 | Disposition: A | Discharge status: Home / Self Care | Payer: Medicare HMO | Source: Ambulatory Visit | Attending: Specialist | Admitting: Specialist

## 2018-01-04 DIAGNOSIS — Z1231 Encounter for screening mammogram for malignant neoplasm of breast: Secondary | ICD-10-CM

## 2018-04-06 ENCOUNTER — Encounter (INDEPENDENT_AMBULATORY_CARE_PROVIDER_SITE_OTHER): Payer: Medicare HMO | Admitting: Ophthalmology

## 2018-10-20 ENCOUNTER — Encounter: Payer: Self-pay | Admitting: Cardiology

## 2018-10-20 ENCOUNTER — Ambulatory Visit (INDEPENDENT_AMBULATORY_CARE_PROVIDER_SITE_OTHER): Payer: Medicare Other | Admitting: Cardiology

## 2018-10-20 VITALS — BP 130/86 | HR 73 | Ht 64.0 in | Wt 220.1 lb

## 2018-10-20 DIAGNOSIS — E1122 Type 2 diabetes mellitus with diabetic chronic kidney disease: Secondary | ICD-10-CM | POA: Diagnosis not present

## 2018-10-20 DIAGNOSIS — I2510 Atherosclerotic heart disease of native coronary artery without angina pectoris: Secondary | ICD-10-CM | POA: Diagnosis not present

## 2018-10-20 DIAGNOSIS — N183 Chronic kidney disease, stage 3 unspecified: Secondary | ICD-10-CM

## 2018-10-20 DIAGNOSIS — I129 Hypertensive chronic kidney disease with stage 1 through stage 4 chronic kidney disease, or unspecified chronic kidney disease: Secondary | ICD-10-CM | POA: Diagnosis not present

## 2018-10-20 DIAGNOSIS — I10 Essential (primary) hypertension: Secondary | ICD-10-CM

## 2018-10-20 MED ORDER — METOPROLOL TARTRATE 25 MG PO TABS: 25.0000 mg | ORAL_TABLET | Freq: Two times a day (BID) | ORAL | 2 refills | Status: DC

## 2018-10-20 NOTE — Progress Notes (Signed)
Subjective:   Brittany Evans, female    DOB: [birth year], 63 y.o.   MRN: [id number]  Brittany Drafts, FNP:  Chief Complaint  Patient presents with  . Coronary Artery Disease  . Hypertension  . Follow-up    6wk    HPI: Brittany Evans  is a 63 y.o. female  with HTN, HLD, T2DM, non-ischemic cardiomyopathy, CKD stage 3, asthma, CAD with NSTEMI in Feb 2012 and was found to have mild disease that was treated medically. EF was previously 45-50% in 2014; however, last echo in Jan 2019 revealed normal LVEF of 58%.  She is here for 6 week follow up. She was started on Metoprolol at her last office visit as her Carvedilol was discontinued in view of asthma. She is tolerating medication well, but thinks that she should be taking full tablet of metoprolol. Reports since last seen by me, she had URI and also had reaction to trulicity after she was recently started on this by her PCP. She reports that she is just now starting to feel better.  She has left arm pain that appears to be musculoskeletal related as pain is only if she doesn't move her arm regularly and if her joints are stiff. Not associated with exertion. She has recently undergone echocardiogram in Jan 2019 that showed improvement in LVEF from 30% to 57%.   Walks 1 to 1.5 miles daily without difficulty. Denies tobacco history. Denies PND, orthopnea, palpitations, edema, syncopal episodes, or symptoms of claudication or TIA.    Past Medical History:  Diagnosis Date  . Asthma    inhaler use as needed rarely  . CAD (coronary artery disease)    NSTEMI 2/12: LAD 30%, D1 40%, oCFX 70-80%, mRCA 10-20%, ?occl. of apical LAD; treated medically  . CHF (congestive heart failure) ([place])   . Chronic kidney disease    stage 3 chronic kidney disease- pt states she is asymptomatic  . Complication of anesthesia    Lightheaded for several days  . Diabetes mellitus   . Dysrhythmia   . Hyperlipidemia   . Hypertension   . Myocardial  infarction ([place])   . Neuromuscular disorder (HCC)    neuropathy due to DM  . NICM (nonischemic cardiomyopathy) ([place])    EF 20-25%, 45% 2012  . Obesity   . Peripheral vascular disease ([place])   . Systolic CHF [facility])     Past Surgical History:  Procedure Laterality Date  . COLONOSCOPY    . DILATATION & CURETTAGE/HYSTEROSCOPY WITH MYOSURE N/A 07/18/2014   Procedure: DILATATION & CURETTAGE/HYSTEROSCOPY WITH MYOSURE;  Surgeon: Woodroe Mode, MD;  Location: [facility];  Service: Gynecology;  Laterality: N/A;  . DILATATION & CURRETTAGE/HYSTEROSCOPY WITH RESECTOCOPE N/A 04/12/2015   Procedure:  DIAGNOSTIC HYSTEROSCOPY WITH RESECTION OF FIBROID AND POLYPS;  Surgeon: Servando Salina, MD;  Location: [facility];  Service: Gynecology;  Laterality: N/A;  . DILATION AND CURETTAGE OF UTERUS N/A 04/12/2015   Procedure: DILATATION AND CURETTAGE;  Surgeon: Servando Salina, MD;  Location: [facility];  Service: Gynecology;  Laterality: N/A;  . NO PAST SURGERIES    . None    . POLYPECTOMY N/A 07/18/2014   Procedure: POLYPECTOMY;  Surgeon: Woodroe Mode, MD;  Location: [facility];  Service: Gynecology;  Laterality: N/A;  . WISDOM TOOTH EXTRACTION      Family History  Problem Relation Age of Onset  . Cancer Mother   . Diabetes Mother   . Coronary artery disease Mother   .  Heart disease Mother   . Cirrhosis Father        Alcohol abuse  . Alcohol abuse Father   . Heart disease Father   . Cancer Sister        lung  . Lung disease Sister   . Pancreatic cancer Maternal Grandmother   . Breast cancer Cousin   . Colon cancer Neg Hx   . Rectal cancer Neg Hx   . Stomach cancer Neg Hx     Social History   Socioeconomic History  . Marital status: Legally Separated    Spouse name: Not on file  . Number of children: 2  . Years of education: Not on file  . Highest education level: Not on file  Occupational History  . Occupation: Dispensing optician: [employer]    Comment: Part Time  Social  Needs  . Financial resource strain: Not on file  . Food insecurity:    Worry: Not on file    Inability: Not on file  . Transportation needs:    Medical: Not on file    Non-medical: Not on file  Tobacco Use  . Smoking status: Never Smoker  . Smokeless tobacco: Never Used  Substance and Sexual Activity  . Alcohol use: No  . Drug use: No  . Sexual activity: Not Currently    Birth control/protection: None, Post-menopausal  Lifestyle  . Physical activity:    Days per week: Not on file    Minutes per session: Not on file  . Stress: Not on file  Relationships  . Social connections:    Talks on phone: Not on file    Gets together: Not on file    Attends religious service: Not on file    Active member of club or organization: Not on file    Attends meetings of clubs or organizations: Not on file    Relationship status: Not on file  . Intimate partner violence:    Fear of current or ex partner: Not on file    Emotionally abused: Not on file    Physically abused: Not on file    Forced sexual activity: Not on file  Other Topics Concern  . Not on file  Social History Narrative   Tobacco use - NO          Current Meds  Medication Sig  . albuterol (PROAIR HFA) 108 (90 BASE) MCG/ACT inhaler Inhale 2 puffs into the lungs every 6 (six) hours as needed. Shortness of breath  . aspirin 81 MG tablet Take 1 tablet (81 mg total) by mouth daily.  . cyclobenzaprine (FLEXERIL) 10 MG tablet continuous as needed.  . metFORMIN (GLUCOPHAGE) 500 MG tablet Take by mouth 2 (two) times daily with a meal.  . metoprolol succinate (TOPROL-XL) 25 MG 24 hr tablet Take 12.5 mg by mouth daily.  . Multiple Vitamin (MULTI-VITAMINS) TABS TAKE 1 TABLET BY MOUTH ONCE DAILY  . nitroGLYCERIN (NITROSTAT) 0.4 MG SL tablet Place 1 tablet (0.4 mg total) under the tongue every 5 (five) minutes as needed. Chest pain  . ondansetron (ZOFRAN-ODT) 8 MG disintegrating tablet continuous as needed.  . simvastatin (ZOCOR) 40  MG tablet Take 1 tablet (40 mg total) by mouth at bedtime.  Marland Kitchen spironolactone (ALDACTONE) 50 MG tablet Take 50 mg by mouth daily.     Review of Systems  Constitution: Negative for decreased appetite, malaise/fatigue, weight gain and weight loss.  Eyes: Negative for visual disturbance.  Cardiovascular: Negative for  chest pain, claudication, dyspnea on exertion, leg swelling, orthopnea, palpitations and syncope.  Respiratory: Negative for hemoptysis and wheezing.   Endocrine: Negative for cold intolerance and heat intolerance.  Hematologic/Lymphatic: Does not bruise/bleed easily.  Skin: Negative for nail changes.  Musculoskeletal: Positive for joint pain (left arm if does not move regularly). Negative for muscle weakness and myalgias.  Gastrointestinal: Negative for abdominal pain, change in bowel habit, nausea and vomiting.  Neurological: Negative for difficulty with concentration, dizziness, focal weakness and headaches.  Psychiatric/Behavioral: Negative for altered mental status and suicidal ideas.  All other systems reviewed and are negative.      Objective:     Blood pressure 130/86, pulse 73, height 5\' 4"  (1.626 m), weight 220 lb 1.6 oz (99.8 kg), SpO2 93 %.  EKG 09/09/2018: Sinus bradycardia at 63 bpm, normal axis, normal interval, LVH. Compared to EKG 08/26/2017, T wave inversion in lateral leads is new.  Echocardiogram 09/02/2017: Left ventricle cavity is normal in size. Normal global wall motion. Doppler evidence of grade I (impaired) diastolic dysfunction, normal LAP. Calculated EF 57%. Mild (Grade I) mitral regurgitation. Mild tricuspid regurgitation. No evidence of pulmonary hypertension  Coronary angiogram 2/12: LAD 30%, D1 40%, oCFX 70-80%, mRCA 10-20%, ?occl. of apical LAD; Medical therapy. LVEF 30%  Physical Exam  Constitutional: She is oriented to person, place, and time. Vital signs are normal. She appears well-developed and well-nourished.  HENT:  Head:  Normocephalic and atraumatic.  Neck: Normal range of motion.  Cardiovascular: Normal rate, regular rhythm, normal heart sounds and intact distal pulses.  2+ pitting edema on left and 1+ pitting edema  Pulmonary/Chest: Effort normal and breath sounds normal. No accessory muscle usage. No respiratory distress.  Abdominal: Soft. Bowel sounds are normal.  Musculoskeletal: Normal range of motion.  Neurological: She is alert and oriented to person, place, and time.  Skin: Skin is warm and dry.  Vitals reviewed.          Assessment & Recommendations:   1. Essential hypertension Blood pressure slightly elevated recently likely related to illness, but fairly stable today. Will increase Metoprolol to 25mg  daily. She will continue to monitor at home and notify me if does not improve.   2. Atherosclerosis of native coronary artery of native heart without angina pectoris On appropriate medical therapy. No symptoms of angina. Her last EKG on 09/09/2018 showed slight changes with T wave inversion in lateral leads compared to previous EKG. Will continue to monitor. Asymptomatic. Continue with ASA and statin therapy. Will request recent labs from PCP office.  3. CKD stage 3 due to type 2 diabetes mellitus (HCC) Currently on Metformin. Had reaction to trulicity. She will continue to follow up with PCP for management of diabetes. As stated above, will request labs from PCP office to evaluate kidney function.  Plan: Patient is stable from cardiac standpoint, will see her back in 6 months, but encouraged her to contact me for any new or worsening problems or if blood pressure does not improve.       Jeri Lager, FNP-C [facility], [place] Office: [phone] Fax: [phone]

## 2018-11-29 ENCOUNTER — Other Ambulatory Visit: Payer: Self-pay | Admitting: Internal Medicine

## 2018-11-29 DIAGNOSIS — Z1231 Encounter for screening mammogram for malignant neoplasm of breast: Secondary | ICD-10-CM

## 2019-01-27 ENCOUNTER — Other Ambulatory Visit: Payer: Self-pay

## 2019-01-27 ENCOUNTER — Ambulatory Visit
Admission: RE | Admit: 2019-01-27 | Discharge: 2019-01-27 | Disposition: A | Discharge status: Home / Self Care | Payer: Medicare Other | Source: Ambulatory Visit | Attending: Internal Medicine | Admitting: Internal Medicine

## 2019-01-27 DIAGNOSIS — Z1231 Encounter for screening mammogram for malignant neoplasm of breast: Secondary | ICD-10-CM

## 2019-03-28 ENCOUNTER — Other Ambulatory Visit: Payer: Self-pay | Admitting: Family

## 2019-03-28 DIAGNOSIS — R600 Localized edema: Secondary | ICD-10-CM

## 2019-03-30 ENCOUNTER — Ambulatory Visit
Admission: RE | Admit: 2019-03-30 | Discharge: 2019-03-30 | Disposition: A | Discharge status: Home / Self Care | Payer: Medicare Other | Source: Ambulatory Visit | Attending: Family | Admitting: Family

## 2019-03-30 DIAGNOSIS — R600 Localized edema: Secondary | ICD-10-CM

## 2019-04-26 ENCOUNTER — Other Ambulatory Visit: Payer: Self-pay

## 2019-04-26 ENCOUNTER — Ambulatory Visit: Payer: Medicare Other | Admitting: Cardiology

## 2019-04-26 ENCOUNTER — Encounter: Payer: Self-pay | Admitting: Cardiology

## 2019-04-26 VITALS — BP 148/84 | HR 50 | Temp 96.8°F | Ht 64.0 in | Wt 230.0 lb

## 2019-04-26 DIAGNOSIS — I2510 Atherosclerotic heart disease of native coronary artery without angina pectoris: Secondary | ICD-10-CM

## 2019-04-26 DIAGNOSIS — N183 Chronic kidney disease, stage 3 unspecified: Secondary | ICD-10-CM

## 2019-04-26 DIAGNOSIS — E1122 Type 2 diabetes mellitus with diabetic chronic kidney disease: Secondary | ICD-10-CM

## 2019-04-26 DIAGNOSIS — I10 Essential (primary) hypertension: Secondary | ICD-10-CM

## 2019-04-26 DIAGNOSIS — I129 Hypertensive chronic kidney disease with stage 1 through stage 4 chronic kidney disease, or unspecified chronic kidney disease: Secondary | ICD-10-CM | POA: Diagnosis not present

## 2019-04-26 NOTE — Progress Notes (Signed)
Primary Physician:  [name], FNP   Patient ID: Brittany Evans, female    DOB: 1956-04-24, 63 y.o.   MRN: [id number]  Subjective:    Chief Complaint  Patient presents with  . Coronary Artery Disease  . Hypertension  . Follow-up    6 month    HPI: [name]  is a 63 y.o. female  with HTN, HLD, T2DM, non-ischemic cardiomyopathy, CKD stage 3, asthma, CAD with NSTEMI in Feb 2012 and was found to have mild disease that was treated medically. EF was previously 45-50% in 2014; however, last echo in Jan 2019 revealed normal LVEF of 58%.  She is here for 6 month follow up. She is doing well without any complaints of chest pain or shortness of breath.  Left arm pain has significantly improved since last seen by me 6 months ago and only occasionally has some tightness in her left arm that resolves with using heat or ice.  She does admit to weight gain over the last few months.  She has recently undergone echocardiogram in Jan 2019 that showed improvement in LVEF from 30% to 57%.   Walks 1 to 1.5 miles daily without difficulty. Denies tobacco history. Denies PND, orthopnea, palpitations, edema, syncopal episodes, or symptoms of claudication or TIA. She has been checking her BP regularly at home and generally systolic readings are in the 130's.  Past Medical History:  Diagnosis Date  . Asthma    inhaler use as needed rarely  . CAD (coronary artery disease)    NSTEMI 2/12: LAD 30%, D1 40%, oCFX 70-80%, mRCA 10-20%, ?occl. of apical LAD; treated medically  . CHF (congestive heart failure) ([place])   . Chronic kidney disease    stage 3 chronic kidney disease- pt states she is asymptomatic  . Complication of anesthesia    Lightheaded for several days  . Diabetes mellitus   . Dysrhythmia   . Hyperlipidemia   . Hypertension   . Myocardial infarction ([place])   . Neuromuscular disorder (HCC)    neuropathy due to DM  . NICM (nonischemic cardiomyopathy) ([place])    EF 20-25%,  45% 2012  . Obesity   . Peripheral vascular disease ([place])   . Systolic CHF [facility])     Past Surgical History:  Procedure Laterality Date  . COLONOSCOPY    . DILATATION & CURETTAGE/HYSTEROSCOPY WITH MYOSURE N/A 07/18/2014   Procedure: DILATATION & CURETTAGE/HYSTEROSCOPY WITH MYOSURE;  Surgeon: Woodroe Mode, MD;  Location: [facility];  Service: Gynecology;  Laterality: N/A;  . DILATATION & CURRETTAGE/HYSTEROSCOPY WITH RESECTOCOPE N/A 04/12/2015   Procedure:  DIAGNOSTIC HYSTEROSCOPY WITH RESECTION OF FIBROID AND POLYPS;  Surgeon: Servando Salina, MD;  Location: [facility];  Service: Gynecology;  Laterality: N/A;  . DILATION AND CURETTAGE OF UTERUS N/A 04/12/2015   Procedure: DILATATION AND CURETTAGE;  Surgeon: Servando Salina, MD;  Location: [facility];  Service: Gynecology;  Laterality: N/A;  . NO PAST SURGERIES    . None    . POLYPECTOMY N/A 07/18/2014   Procedure: POLYPECTOMY;  Surgeon: Woodroe Mode, MD;  Location: [facility];  Service: Gynecology;  Laterality: N/A;  . WISDOM TOOTH EXTRACTION      Social History   Socioeconomic History  . Marital status: Married    Spouse name: Not on file  . Number of children: 2  . Years of education: Not on file  . Highest education level: Not on file  Occupational History  . Occupation: Environmental education officer  Employer: [employer]    Comment: Part Time  Social Needs  . Financial resource strain: Not on file  . Food insecurity    Worry: Not on file    Inability: Not on file  . Transportation needs    Medical: Not on file    Non-medical: Not on file  Tobacco Use  . Smoking status: Never Smoker  . Smokeless tobacco: Never Used  Substance and Sexual Activity  . Alcohol use: No  . Drug use: No  . Sexual activity: Not Currently    Birth control/protection: None, Post-menopausal  Lifestyle  . Physical activity    Days per week: Not on file    Minutes per session: Not on file  . Stress: Not on file  Relationships  . Social Product manager on phone: Not on file    Gets together: Not on file    Attends religious service: Not on file    Active member of club or organization: Not on file    Attends meetings of clubs or organizations: Not on file    Relationship status: Not on file  . Intimate partner violence    Fear of current or ex partner: Not on file    Emotionally abused: Not on file    Physically abused: Not on file    Forced sexual activity: Not on file  Other Topics Concern  . Not on file  Social History Narrative   Tobacco use - NO          Review of Systems  Constitution: Negative for decreased appetite, malaise/fatigue, weight gain and weight loss.  Eyes: Negative for visual disturbance.  Cardiovascular: Negative for chest pain, claudication, dyspnea on exertion, leg swelling, orthopnea, palpitations and syncope.  Respiratory: Negative for hemoptysis and wheezing.   Endocrine: Negative for cold intolerance and heat intolerance.  Hematologic/Lymphatic: Does not bruise/bleed easily.  Skin: Negative for nail changes.  Musculoskeletal: Positive for joint pain (improved). Negative for muscle weakness and myalgias.  Gastrointestinal: Negative for abdominal pain, change in bowel habit, nausea and vomiting.  Neurological: Negative for difficulty with concentration, dizziness, focal weakness and headaches.  Psychiatric/Behavioral: Negative for altered mental status and suicidal ideas.  All other systems reviewed and are negative.     Objective:  Blood pressure (!) 148/84, pulse (!) 50, temperature (!) 96.8 F (36 C), height 5' 4" (1.626 m), weight 230 lb (104.3 kg), SpO2 97 %. Body mass index is 39.48 kg/m.    Physical Exam  Constitutional: She is oriented to person, place, and time. Vital signs are normal. She appears well-developed and well-nourished.  HENT:  Head: Normocephalic and atraumatic.  Neck: Normal range of motion.  Cardiovascular: Normal rate, regular rhythm, normal heart sounds and  intact distal pulses.  Trace edema  Pulmonary/Chest: Effort normal and breath sounds normal. No accessory muscle usage. No respiratory distress.  Abdominal: Soft. Bowel sounds are normal.  Musculoskeletal: Normal range of motion.  Neurological: She is alert and oriented to person, place, and time.  Skin: Skin is warm and dry.  Vitals reviewed.  Radiology: No results found.  Laboratory examination:   04/14/2017: Creatinine 1.46, EGFR 39/45, potassium 5.1, CMP normal.  CMP Latest Ref Rng & Units 04/11/2015 07/17/2014 04/29/2013  Glucose 65 - 99 mg/dL 112(H) 91 132(H)  BUN 6 - 20 mg/dL 22(H) 17 20  Creatinine 0.44 - 1.00 mg/dL 1.34(H) 1.33(H) 1.23(H)  Sodium 135 - 145 mmol/L 139 140 139  Potassium 3.5 - 5.1 mmol/L  4.0 4.2 4.1  Chloride 101 - 111 mmol/L 107 105 109  CO2 22 - 32 mmol/L _0 Calcium 8.9 - 10.3 mg/dL 9.8 9.6 9.4  Total Protein 6.0 - 8.3 g/dL - - -  Total Bilirubin 0.3 - 1.2 mg/dL - - -  Alkaline Phos 39 - 117 U/L - - -  AST 0 - 37 U/L - - -  ALT 0 - 35 U/L - - -   CBC Latest Ref Rng & Units 04/11/2015 07/17/2014 03/11/2013  WBC 4.0 - 10.5 K/uL 5.1 5.0 4.1  Hemoglobin 12.0 - 15.0 g/dL 13.8 13.4 15.4(H)  Hematocrit 36.0 - 46.0 % 40.8 39.1 45.9  Platelets 150 - 400 K/uL 237 214 252   Lipid Panel     Component Value Date/Time   CHOL 130 06/14/2013 1357   TRIG 108 06/14/2013 1357   HDL 50 06/14/2013 1357   CHOLHDL 2.6 06/14/2013 1357   VLDL 22 06/14/2013 1357   LDLCALC 58 06/14/2013 1357   HEMOGLOBIN A1C Lab Results  Component Value Date   HGBA1C 7.2 01/02/2014   MPG 169 (H) 09/30/2010   TSH No results for input(s): TSH in the last 8760 hours.  PRN Meds:. Medications Discontinued During This Encounter  Medication Reason  . cyclobenzaprine (FLEXERIL) 10 MG tablet   . ondansetron (ZOFRAN-ODT) 8 MG disintegrating tablet Error   Current Meds  Medication Sig  . albuterol (PROAIR HFA) 108 (90 BASE) MCG/ACT inhaler Inhale 2 puffs into the lungs every 6  (six) hours as needed. Shortness of breath  . aspirin 81 MG tablet Take 1 tablet (81 mg total) by mouth daily.  . metFORMIN (GLUCOPHAGE) 500 MG tablet Take by mouth 2 (two) times daily with a meal.  . metoprolol tartrate (LOPRESSOR) 25 MG tablet Take 1 tablet (25 mg total) by mouth 2 (two) times daily.  . montelukast (SINGULAIR) 10 MG tablet Take 1 tablet by mouth daily.  . Multiple Vitamin (MULTI-VITAMINS) TABS TAKE 1 TABLET BY MOUTH ONCE DAILY  . nitroGLYCERIN (NITROSTAT) 0.4 MG SL tablet Place 1 tablet (0.4 mg total) under the tongue every 5 (five) minutes as needed. Chest pain  . NYAMYC powder Apply 1 Units topically 2 (two) times daily.  . simvastatin (ZOCOR) 40 MG tablet Take 1 tablet (40 mg total) by mouth at bedtime.  Marland Kitchen spironolactone (ALDACTONE) 50 MG tablet Take 50 mg by mouth daily.    Cardiac Studies:   Echocardiogram 09/02/2017: Left ventricle cavity is normal in size. Normal global wall motion. Doppler evidence of grade I (impaired) diastolic dysfunction, normal LAP. Calculated EF 57%. Mild (Grade I) mitral regurgitation. Mild tricuspid regurgitation. No evidence of pulmonary hypertension  Coronary angiogram 2/12: LAD 30%, D1 40%, oCFX 70-80%, mRCA 10-20%, ?occl. of apical LAD; Medical therapy. LVEF 30%  Assessment:   Atherosclerosis of native coronary artery of native heart without angina pectoris - Plan: EKG 12-Lead  Essential hypertension - Plan: EKG 12-Lead  CKD stage 3 due to type 2 diabetes mellitus ([place])  EKG 04/26/2019: Marked sinus bradycardia at 48 bpm, normal axis, LVH. T wave inversion in lateral leads, cannot exclude ischemia. No changes compared to EKG 09/09/18  Recommendations:   Patient is presently doing well, no symptoms of angina or clinical evidence of heart failure.  Blood pressure was initially elevated in our office, on recheck it had improved but continues to be slightly elevated.  She has gained weight since last seen by me 6 months ago and I  suspect is contributing  to her blood pressure.  She has been monitoring and generally blood pressure is in the 657 range systolic.  She will continue monitoring at home.  I have encouraged her to work on weight loss and diet modifications and if her blood pressure continues to be elevated, will make medication changes at that time.  I have requested labs from PCP office for evaluation of her kidney function.  She is not currently on ACE inhibitor or ARB therapy in view of chronic kidney disease, but if kidney function has been stable, could consider challenging her on this.  She is set a goal of 10 pound weight loss before she comes back to see me.  I will see her back in 6 weeks for follow-up.  Miquel Dunn, MSN, APRN, FNP-C [facility]. [place] Office: [phone] Fax: [phone]

## 2019-06-01 ENCOUNTER — Other Ambulatory Visit: Payer: Self-pay

## 2019-06-01 ENCOUNTER — Other Ambulatory Visit (HOSPITAL_COMMUNITY): Payer: Self-pay | Admitting: Vascular Surgery

## 2019-06-01 ENCOUNTER — Ambulatory Visit (HOSPITAL_COMMUNITY)
Admission: RE | Admit: 2019-06-01 | Discharge: 2019-06-01 | Disposition: A | Discharge status: Home / Self Care | Payer: Medicare Other | Source: Ambulatory Visit | Attending: Family | Admitting: Family

## 2019-06-01 DIAGNOSIS — R600 Localized edema: Secondary | ICD-10-CM | POA: Diagnosis present

## 2019-06-02 ENCOUNTER — Encounter: Payer: Self-pay | Admitting: Vascular Surgery

## 2019-06-02 ENCOUNTER — Ambulatory Visit (INDEPENDENT_AMBULATORY_CARE_PROVIDER_SITE_OTHER): Payer: Medicare Other | Admitting: Vascular Surgery

## 2019-06-02 VITALS — BP 157/94 | HR 60 | Temp 96.6°F | Resp 16 | Ht 64.0 in | Wt 226.0 lb

## 2019-06-02 DIAGNOSIS — I872 Venous insufficiency (chronic) (peripheral): Secondary | ICD-10-CM

## 2019-06-02 NOTE — Progress Notes (Signed)
REASON FOR CONSULT:    Chronic venous insufficiency with left lower extremity swelling.  The consult is requested by Dr. Dustin Folks.  ASSESSMENT & PLAN:   CHRONIC VENOUS INSUFFICIENCY: She has CEAP C4a venous disease.  I have discussed with her the importance of intermittent leg elevation and the proper positioning for this.  I recommended that she try a thigh-high compression stocking with a gradient of 20 to 30 mmHg and have written her a prescription for this.  We discussed the importance of exercise especially walking.  I encouraged her to avoid prolonged sitting and standing.  We also discussed the importance of weight management.  I explained that central obesity especially increases lower extremity venous pressure.  If her symptoms progress that I think she would be a reasonable candidate for laser ablation of the proximal left great saphenous vein above the takeoff of the varicose veins in her left thigh which are fairly deep.  I think this would lower the venous pressure to help alleviate her symptoms.   Brittany Mayo, MD, [place] [phone] Office: [phone]   HPI:   Brittany Evans is a pleasant 63 y.o. female, who was referred with left lower extremity swelling.  I have reviewed the records from the referring office.  The patient was seen on 03/23/2019.  The patient was complaining of left leg pain and swelling.  This began over 10 years ago after an injury from playing softball.  She did have some noninvasive testing which showed no evidence of DVT but superficial thrombophlebitis involving some varicose veins of the left leg.  Patient also has a history of hypertension and diabetes.  Her hemoglobin A1c at that time was 8.7.  On my history the patient states that she has had left leg swelling for over 10 years.  This began after her softball injury.  The swelling is not been especially symptomatic.  However in August she began developing left calf pain in her medial  calf.  She does not remember any specific injury related to this.  She did have a duplex which showed no evidence of DVT but she does have superficial thrombophlebitis involving varicose veins in her medial left calf.  She experiences aching pain and heaviness in the left leg which is aggravated by sitting and standing and relieved with elevation.  She has been wearing knee-high compression stockings with a gradient of 15 to 20 mmHg.  She does not take ibuprofen as she does not like to take medications.  She is on aspirin.  She denies any history of claudication, rest pain, or nonhealing ulcers.  Her risk factors for peripheral vascular disease include diabetes, hypertension, and hypercholesterolemia.  She denies any family history of premature cardiovascular disease or tobacco use.  Past Medical History:  Diagnosis Date  . Asthma    inhaler use as needed rarely  . CAD (coronary artery disease)    NSTEMI 2/12: LAD 30%, D1 40%, oCFX 70-80%, mRCA 10-20%, ?occl. of apical LAD; treated medically  . CHF (congestive heart failure) ([place])   . Chronic kidney disease    stage 3 chronic kidney disease- pt states she is asymptomatic  . Complication of anesthesia    Lightheaded for several days  . Diabetes mellitus   . Dysrhythmia   . Hyperlipidemia   . Hypertension   . Myocardial infarction ([place])   . Neuromuscular disorder (HCC)    neuropathy due to DM  . NICM (nonischemic cardiomyopathy) (HCC)    EF 20-25%, 45%  2012  . Obesity   . Peripheral vascular disease ([place])   . Systolic CHF ([place])     Family History  Problem Relation Age of Onset  . Cancer Mother   . Diabetes Mother   . Coronary artery disease Mother   . Heart disease Mother   . Cirrhosis Father        Alcohol abuse  . Alcohol abuse Father   . Heart disease Father   . Cancer Sister        lung  . Lung disease Sister   . Pancreatic cancer Maternal Grandmother   . Breast cancer Cousin   . Colon cancer Neg Hx   . Rectal cancer  Neg Hx   . Stomach cancer Neg Hx     SOCIAL HISTORY: Social History   Socioeconomic History  . Marital status: Married    Spouse name: Not on file  . Number of children: 2  . Years of education: Not on file  . Highest education level: Not on file  Occupational History  . 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 Financial resource strain: Not on file  . Food insecurity    Worry: Not on file    Inability: Not on file  . Transportation needs    Medical: Not on file    Non-medical: Not on file  Tobacco Use  . Smoking status: Never Smoker  . Smokeless tobacco: Never Used  Substance and Sexual Activity  . Alcohol use: No  . Drug use: No  . Sexual activity: Not Currently    Birth control/protection: None, Post-menopausal  Lifestyle  . Physical activity    Days per week: Not on file    Minutes per session: Not on file  . Stress: Not on file  Relationships  . Social Herbalist on phone: Not on file    Gets together: Not on file    Attends religious service: Not on file    Active member of club or organization: Not on file    Attends meetings of clubs or organizations: Not on file    Relationship status: Not on file  . Intimate partner violence    Fear of current or ex partner: Not on file    Emotionally abused: Not on file    Physically abused: Not on file    Forced sexual activity: Not on file  Other Topics Concern  . Not on file  Social History Narrative   Tobacco use - NO          No Known Allergies  Current Outpatient Medications  Medication Sig Dispense Refill  . albuterol (PROAIR HFA) 108 (90 BASE) MCG/ACT inhaler Inhale 2 puffs into the lungs every 6 (six) hours as needed. Shortness of breath 1 Inhaler 3  . aspirin 81 MG tablet Take 1 tablet (81 mg total) by mouth daily. 30 tablet 3  . lisinopril (ZESTRIL) 10 MG tablet TK 1 T PO ATN    . metFORMIN (GLUCOPHAGE) 500 MG tablet Take by  mouth 2 (two) times daily with a meal.    . montelukast (SINGULAIR) 10 MG tablet Take 1 tablet by mouth daily.    . Multiple Vitamin (MULTI-VITAMINS) TABS TAKE 1 TABLET BY MOUTH ONCE DAILY 30 tablet 3  . nitroGLYCERIN (NITROSTAT) 0.4 MG SL tablet Place 1 tablet (0.4 mg total) under the tongue every 5 (five) minutes as needed. Chest pain  10 tablet 0  . NYAMYC powder Apply 1 Units topically 2 (two) times daily.    . simvastatin (ZOCOR) 40 MG tablet Take 1 tablet (40 mg total) by mouth at bedtime. 90 tablet 3  . spironolactone (ALDACTONE) 50 MG tablet Take 50 mg by mouth daily.    . metoprolol tartrate (LOPRESSOR) 25 MG tablet Take 1 tablet (25 mg total) by mouth 2 (two) times daily. 180 tablet 2   No current facility-administered medications for this visit.     REVIEW OF SYSTEMS:  [X]  denotes positive finding, [ ]  denotes negative finding Cardiac  Comments:  Chest pain or chest pressure:    Shortness of breath upon exertion:    Short of breath when lying flat:    Irregular heart rhythm:        Vascular    Pain in calf, thigh, or hip brought on by ambulation: x   Pain in feet at night that wakes you up from your sleep:  x   Blood clot in your veins: x   Leg swelling:  x       Pulmonary    Oxygen at home:    Productive cough:     Wheezing:         Neurologic    Sudden weakness in arms or legs:     Sudden numbness in arms or legs:     Sudden onset of difficulty speaking or slurred speech:    Temporary loss of vision in one eye:     Problems with dizziness:         Gastrointestinal    Blood in stool:     Vomited blood:         Genitourinary    Burning when urinating:     Blood in urine:        Psychiatric    Major depression:         Hematologic    Bleeding problems:    Problems with blood clotting too easily:        Skin    Rashes or ulcers:        Constitutional    Fever or chills:     PHYSICAL EXAM:   Vitals:   06/02/19 0922  BP: (!) 157/94  Pulse: 60   Resp: 16  Temp: (!) 96.6 F (35.9 C)  TempSrc: Temporal  SpO2: 98%  Weight: 226 lb (102.5 kg)  Height: 5\' 4"  (1.626 m)   Body mass index is 38.79 kg/m.  GENERAL: The patient is a well-nourished female, in no acute distress. The vital signs are documented above. CARDIAC: There is a regular rate and rhythm.  VASCULAR: I do not detect carotid bruits. On the right side she has a palpable dorsalis pedis pulse. On the left side she has a palpable dorsalis pedis and posterior tibial pulse. She has some varicose veins along her anterior medial left calf on exam.  The left leg is swollen compared to the right.  The right calf measures 17 inches in diameter.  The left calf measures 18 inches in diameter.   She does have some hyperpigmentation bilaterally.  I did look at the left great saphenous vein myself with the SonoSite.  The proximal vein is markedly dilated with reflux.  However in the proximal to mid thigh this feeds into a large cluster of varicose veins which are fairly deep and below that the saphenous vein is harder to follow.  Based on this assessment I think the proximal  segment is amenable to laser ablation.  PULMONARY: There is good air exchange bilaterally without wheezing or rales. ABDOMEN: Soft and non-tender with normal pitched bowel sounds.  MUSCULOSKELETAL: There are no major deformities or cyanosis. NEUROLOGIC: No focal weakness or paresthesias are detected. SKIN: There are no ulcers or rashes noted. PSYCHIATRIC: The patient has a normal affect.  DATA:    VENOUS DUPLEX: I have reviewed the venous duplex scan that was done yesterday.  This was of the left lower extremity.  There was no evidence of deep venous thrombosis.  There was some superficial phlebitis involving some varicosities in the left leg.  There was deep venous reflux involving the common femoral vein.  There was superficial venous reflux involving the left great saphenous vein which had reflux from the  saphenofemoral junction to the knee.  This vein was significantly dilated with diameters ranging from 0.86-1.28 cm.  I reviewed the venous duplex that was done on 03/30/2019.  This showed no evidence of DVT in either lower extremity.  There was superficial thrombophlebitis involving some varicose veins in the left calf.

## 2019-06-07 ENCOUNTER — Encounter: Payer: Self-pay | Admitting: Cardiology

## 2019-06-07 ENCOUNTER — Other Ambulatory Visit: Payer: Self-pay

## 2019-06-07 ENCOUNTER — Ambulatory Visit: Payer: Medicare Other | Admitting: Cardiology

## 2019-06-07 VITALS — BP 139/80 | HR 60 | Temp 96.0°F | Ht 64.0 in | Wt 226.0 lb

## 2019-06-07 DIAGNOSIS — I2510 Atherosclerotic heart disease of native coronary artery without angina pectoris: Secondary | ICD-10-CM

## 2019-06-07 DIAGNOSIS — I10 Essential (primary) hypertension: Secondary | ICD-10-CM

## 2019-06-07 DIAGNOSIS — E1122 Type 2 diabetes mellitus with diabetic chronic kidney disease: Secondary | ICD-10-CM | POA: Diagnosis not present

## 2019-06-07 DIAGNOSIS — E668 Other obesity: Secondary | ICD-10-CM | POA: Diagnosis not present

## 2019-06-07 DIAGNOSIS — I129 Hypertensive chronic kidney disease with stage 1 through stage 4 chronic kidney disease, or unspecified chronic kidney disease: Secondary | ICD-10-CM

## 2019-06-07 DIAGNOSIS — N183 Chronic kidney disease, stage 3 unspecified: Secondary | ICD-10-CM

## 2019-06-07 NOTE — Progress Notes (Signed)
Primary Physician:  [name], FNP   Patient ID: Brittany Evans, female    DOB: November 25, 1955, 63 y.o.   MRN: [id number]  Subjective:    Chief Complaint  Patient presents with  . Coronary Artery Disease    6 week follow up  . Hypertension    HPI: [name]  is a 63 y.o. female  with HTN, HLD, T2DM, non-ischemic cardiomyopathy, CKD stage 3, asthma, CAD with NSTEMI in Feb 2012 and was found to have mild disease that was treated medically. EF was previously 45-50% in 2014; however, last echo in Jan 2019 revealed normal LVEF of 58%.  She is here for 6 week follow up for hypertension and to improve compliance with weight loss. She is doing well without any complaints of chest pain or shortness of breath.  She reports being started on lisinopril by her nephrologist.  She has been monitoring her blood pressure and systolic readings have been in the 110-120 range.  She is tolerating medication well.  She has recently undergone echocardiogram in Jan 2019 that showed improvement in LVEF from 30% to 57%.   At her last visit, she had set a goal of losing 10 pounds by her appointment today.  She states that she has made diet changes, but has not yet started exercising.  Denies tobacco history. Denies PND, orthopnea, palpitations, edema, syncopal episodes, or symptoms of claudication or TIA.   Past Medical History:  Diagnosis Date  . Asthma    inhaler use as needed rarely  . CAD (coronary artery disease)    NSTEMI 2/12: LAD 30%, D1 40%, oCFX 70-80%, mRCA 10-20%, ?occl. of apical LAD; treated medically  . CHF (congestive heart failure) ([place])   . Chronic kidney disease    stage 3 chronic kidney disease- pt states she is asymptomatic  . Complication of anesthesia    Lightheaded for several days  . Diabetes mellitus   . Dysrhythmia   . Hyperlipidemia   . Hypertension   . Myocardial infarction ([place])   . Neuromuscular disorder (HCC)    neuropathy due to DM  . NICM  (nonischemic cardiomyopathy) ([place])    EF 20-25%, 45% 2012  . Obesity   . Peripheral vascular disease ([place])   . Systolic CHF [facility])     Past Surgical History:  Procedure Laterality Date  . COLONOSCOPY    . DILATATION & CURETTAGE/HYSTEROSCOPY WITH MYOSURE N/A 07/18/2014   Procedure: DILATATION & CURETTAGE/HYSTEROSCOPY WITH MYOSURE;  Surgeon: Woodroe Mode, MD;  Location: [facility];  Service: Gynecology;  Laterality: N/A;  . DILATATION & CURRETTAGE/HYSTEROSCOPY WITH RESECTOCOPE N/A 04/12/2015   Procedure:  DIAGNOSTIC HYSTEROSCOPY WITH RESECTION OF FIBROID AND POLYPS;  Surgeon: Servando Salina, MD;  Location: [facility];  Service: Gynecology;  Laterality: N/A;  . DILATION AND CURETTAGE OF UTERUS N/A 04/12/2015   Procedure: DILATATION AND CURETTAGE;  Surgeon: Servando Salina, MD;  Location: [facility];  Service: Gynecology;  Laterality: N/A;  . NO PAST SURGERIES    . None    . POLYPECTOMY N/A 07/18/2014   Procedure: POLYPECTOMY;  Surgeon: Woodroe Mode, MD;  Location: [facility];  Service: Gynecology;  Laterality: N/A;  . WISDOM TOOTH EXTRACTION      Social History   Socioeconomic History  . Marital status: Married    Spouse name: Not on file  . Number of children: 2  . Years of education: Not on file  . Highest education level: Not on file  Occupational History  .  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 Financial resource strain: Not on file  . Food insecurity    Worry: Not on file    Inability: Not on file  . Transportation needs    Medical: Not on file    Non-medical: Not on file  Tobacco Use  . Smoking status: Never Smoker  . Smokeless tobacco: Never Used  Substance and Sexual Activity  . Alcohol use: No  . Drug use: No  . Sexual activity: Not Currently    Birth control/protection: None, Post-menopausal  Lifestyle  . Physical activity    Days per week: Not on file    Minutes per session: Not on file  . Stress:  Not on file  Relationships  . Social Herbalist on phone: Not on file    Gets together: Not on file    Attends religious service: Not on file    Active member of club or organization: Not on file    Attends meetings of clubs or organizations: Not on file    Relationship status: Not on file  . Intimate partner violence    Fear of current or ex partner: Not on file    Emotionally abused: Not on file    Physically abused: Not on file    Forced sexual activity: Not on file  Other Topics Concern  . Not on file  Social History Narrative   Tobacco use - NO          Review of Systems  Constitution: Negative for decreased appetite, malaise/fatigue, weight gain and weight loss.  Eyes: Negative for visual disturbance.  Cardiovascular: Negative for chest pain, claudication, dyspnea on exertion, leg swelling, orthopnea, palpitations and syncope.  Respiratory: Negative for hemoptysis and wheezing.   Endocrine: Negative for cold intolerance and heat intolerance.  Hematologic/Lymphatic: Does not bruise/bleed easily.  Skin: Negative for nail changes.  Musculoskeletal: Positive for joint pain (improved). Negative for muscle weakness and myalgias.  Gastrointestinal: Negative for abdominal pain, change in bowel habit, nausea and vomiting.  Neurological: Negative for difficulty with concentration, dizziness, focal weakness and headaches.  Psychiatric/Behavioral: Negative for altered mental status and suicidal ideas.  All other systems reviewed and are negative.     Objective:  Blood pressure 139/80, pulse 60, temperature (!) 96 F (35.6 C), height '5\' 4"'  (1.626 m), weight 226 lb (102.5 kg), SpO2 97 %. Body mass index is 38.79 kg/m.    Physical Exam  Constitutional: She is oriented to person, place, and time. Vital signs are normal. She appears well-developed and well-nourished.  HENT:  Head: Normocephalic and atraumatic.  Neck: Normal range of motion.  Cardiovascular: Normal rate,  regular rhythm, normal heart sounds and intact distal pulses.  Trace edema  Pulmonary/Chest: Effort normal and breath sounds normal. No accessory muscle usage. No respiratory distress.  Abdominal: Soft. Bowel sounds are normal.  Musculoskeletal: Normal range of motion.  Neurological: She is alert and oriented to person, place, and time.  Skin: Skin is warm and dry.  Vitals reviewed.  Radiology: No results found.  Laboratory examination:   04/14/2017: Creatinine 1.46, EGFR 39/45, potassium 5.1, CMP normal.  CMP Latest Ref Rng & Units 04/11/2015 07/17/2014 04/29/2013  Glucose 65 - 99 mg/dL 112(H) 91 132(H)  BUN 6 - 20 mg/dL 22(H) 17 20  Creatinine 0.44 - 1.00 mg/dL 1.34(H) 1.33(H) 1.23(H)  Sodium 135 - 145 mmol/L 139 140 139  Potassium 3.5 - 5.1 mmol/L 4.0 4.2 4.1  Chloride 101 - 111 mmol/L 107 105 109  CO2 22 - 32 mmol/L '23 23 26  ' Calcium 8.9 - 10.3 mg/dL 9.8 9.6 9.4  Total Protein 6.0 - 8.3 g/dL - - -  Total Bilirubin 0.3 - 1.2 mg/dL - - -  Alkaline Phos 39 - 117 U/L - - -  AST 0 - 37 U/L - - -  ALT 0 - 35 U/L - - -   CBC Latest Ref Rng & Units 04/11/2015 07/17/2014 03/11/2013  WBC 4.0 - 10.5 K/uL 5.1 5.0 4.1  Hemoglobin 12.0 - 15.0 g/dL 13.8 13.4 15.4(H)  Hematocrit 36.0 - 46.0 % 40.8 39.1 45.9  Platelets 150 - 400 K/uL 237 214 252   Lipid Panel     Component Value Date/Time   CHOL 130 06/14/2013 1357   TRIG 108 06/14/2013 1357   HDL 50 06/14/2013 1357   CHOLHDL 2.6 06/14/2013 1357   VLDL 22 06/14/2013 1357   LDLCALC 58 06/14/2013 1357   HEMOGLOBIN A1C Lab Results  Component Value Date   HGBA1C 7.2 01/02/2014   MPG 169 (H) 09/30/2010   TSH No results for input(s): TSH in the last 8760 hours.  PRN Meds:. There are no discontinued medications. Current Meds  Medication Sig  . albuterol (PROAIR HFA) 108 (90 BASE) MCG/ACT inhaler Inhale 2 puffs into the lungs every 6 (six) hours as needed. Shortness of breath  . aspirin 81 MG tablet Take 1 tablet (81 mg total) by  mouth daily.  Marland Kitchen lisinopril (ZESTRIL) 10 MG tablet Take 10 mg by mouth daily.   . metFORMIN (GLUCOPHAGE) 500 MG tablet Take by mouth 2 (two) times daily with a meal.  . metoprolol tartrate (LOPRESSOR) 25 MG tablet Take 1 tablet (25 mg total) by mouth 2 (two) times daily.  . montelukast (SINGULAIR) 10 MG tablet Take 1 tablet by mouth as needed.   . Multiple Vitamin (MULTI-VITAMINS) TABS TAKE 1 TABLET BY MOUTH ONCE DAILY  . nitroGLYCERIN (NITROSTAT) 0.4 MG SL tablet Place 1 tablet (0.4 mg total) under the tongue every 5 (five) minutes as needed. Chest pain  . NYAMYC powder Apply 1 Units topically 2 (two) times daily.  . simvastatin (ZOCOR) 40 MG tablet Take 1 tablet (40 mg total) by mouth at bedtime.  Marland Kitchen spironolactone (ALDACTONE) 50 MG tablet Take 50 mg by mouth daily.    Cardiac Studies:   Echocardiogram 09/02/2017: Left ventricle cavity is normal in size. Normal global wall motion. Doppler evidence of grade I (impaired) diastolic dysfunction, normal LAP. Calculated EF 57%. Mild (Grade I) mitral regurgitation. Mild tricuspid regurgitation. No evidence of pulmonary hypertension  Coronary angiogram 2/12: LAD 30%, D1 40%, oCFX 70-80%, mRCA 10-20%, ?occl. of apical LAD; Medical therapy. LVEF 30%  Assessment:   Atherosclerosis of native coronary artery of native heart without angina pectoris  Essential hypertension  CKD stage 3 due to type 2 diabetes mellitus (HCC)  Moderate obesity  EKG 04/26/2019: Marked sinus bradycardia at 48 bpm, normal axis, LVH. T wave inversion in lateral leads, cannot exclude ischemia. No changes compared to EKG 09/09/18  Recommendations:   Patient is here for follow-up on hypertension and to improve compliance with weight loss.  She had originally set a goal of losing 10 pounds by this follow-up, she is only lost 4 pounds.  She has been making diet changes and is considering starting regular exercise which I have strongly encouraged her to do.  Her blood  pressure  has also improved.  She has been started on lisinopril by her nephrologist and is to see her in the next few months for follow-up.  She is also monitoring her blood pressure at home and it is well controlled.  We will continue with present medications.  No symptoms of angina or clinical evidence of heart failure.  Previous echocardiogram in January 2019 showed improvement in LVEF.  Overall, patient is stable from a cardiac standpoint.  She hopes to lose 20 pounds by the time she sees [place] next.  We will see her back in 6 months or sooner if problems.  Miquel Dunn, MSN, APRN, FNP-C [facility] [place] Office: [phone] Fax: [phone]

## 2019-07-21 ENCOUNTER — Other Ambulatory Visit: Payer: Self-pay

## 2019-07-21 MED ORDER — METOPROLOL TARTRATE 25 MG PO TABS: 25.0000 mg | ORAL_TABLET | Freq: Two times a day (BID) | ORAL | 1 refills | Status: DC

## 2019-07-26 ENCOUNTER — Other Ambulatory Visit: Payer: Self-pay

## 2019-07-26 MED ORDER — METOPROLOL TARTRATE 25 MG PO TABS: 25.0000 mg | ORAL_TABLET | Freq: Two times a day (BID) | ORAL | 1 refills | Status: DC

## 2019-08-05 ENCOUNTER — Other Ambulatory Visit: Payer: Self-pay | Admitting: Cardiology

## 2019-08-05 DIAGNOSIS — Z20822 Contact with and (suspected) exposure to covid-19: Secondary | ICD-10-CM

## 2019-08-06 LAB — NOVEL CORONAVIRUS, NAA: SARS-CoV-2, NAA: NOT DETECTED

## 2019-09-15 ENCOUNTER — Other Ambulatory Visit: Payer: Self-pay

## 2019-09-15 ENCOUNTER — Ambulatory Visit: Payer: Medicare HMO | Admitting: Vascular Surgery

## 2019-09-15 ENCOUNTER — Encounter: Payer: Self-pay | Admitting: Vascular Surgery

## 2019-09-15 VITALS — BP 159/83 | HR 50 | Temp 96.6°F | Resp 16 | Ht 64.0 in | Wt 232.0 lb

## 2019-09-15 DIAGNOSIS — I872 Venous insufficiency (chronic) (peripheral): Secondary | ICD-10-CM

## 2019-09-15 NOTE — Progress Notes (Signed)
Patient name: Brittany Evans MRN: [id number] DOB: 27-Nov-1955 Sex: female  REASON FOR VISIT:   Follow-up of chronic venous insufficiency.  HPI:   Brittany Evans is a pleasant 64 y.o. female who I saw in consultation on 06/02/2019 with chronic venous insufficiency and left lower extremity swelling.  She had evidence of CEAP C4a venous disease.  On exam, the left leg was swollen in comparison to the right.  She had some varicose veins along the anterior medial left calf on exam.  She had hyperpigmentation.  Her venous duplex scan at that time showed that on the left side there was no evidence of DVT or superficial venous thrombosis.  There was deep venous reflux involving the common femoral vein.  There was superficial venous reflux involving the left great saphenous vein which had reflux from the saphenofemoral junction to the knee.  The vein was markedly dilated.  I did look at the vein myself with the SonoSite and the proximal vein was markedly dilated.  However in the proximal to mid thigh there was a large tributary which fed a cluster of varicose veins.  Below that the saphenous vein was harder to follow.  I encouraged her to try thigh-high compression stockings with a gradient of 20 to 30 mmHg, elevate her legs, and take ibuprofen as needed for pain.  We also discussed the importance of weight management and exercise.  She comes in for 46-month follow-up visit.  Since I saw her last, she states that her symptoms in her legs have improved some.  She still gets some aching pain and heaviness which is aggravated by sitting and standing but she states that her pantyhose, 20 to 30 mmHg compression stockings do help her symptoms.  She also has been elevating her legs which helps.  She does not like to take pain medicine.   Past Medical History:  Diagnosis Date  . Asthma    inhaler use as needed rarely  . CAD (coronary artery disease)    NSTEMI 2/12: LAD 30%, D1 40%, oCFX 70-80%, mRCA  10-20%, ?occl. of apical LAD; treated medically  . CHF (congestive heart failure) ([place])   . Chronic kidney disease    stage 3 chronic kidney disease- pt states she is asymptomatic  . Complication of anesthesia    Lightheaded for several days  . Diabetes mellitus   . Dysrhythmia   . Hyperlipidemia   . Hypertension   . Myocardial infarction ([place])   . Neuromuscular disorder (HCC)    neuropathy due to DM  . NICM (nonischemic cardiomyopathy) ([place])    EF 20-25%, 45% 2012  . Obesity   . Peripheral vascular disease ([place])   . Systolic CHF ([place])     Family History  Problem Relation Age of Onset  . Cancer Mother   . Diabetes Mother   . Coronary artery disease Mother   . Heart disease Mother   . Cirrhosis Father        Alcohol abuse  . Alcohol abuse Father   . Heart disease Father   . Cancer Sister        lung  . Lung disease Sister   . Pancreatic cancer Maternal Grandmother   . Breast cancer Cousin   . Colon cancer Neg Hx   . Rectal cancer Neg Hx   . Stomach cancer Neg Hx     SOCIAL HISTORY: Social History   Tobacco Use  . Smoking status: Never Smoker  . Smokeless tobacco: Never Used  Substance Use Topics  . Alcohol use: No    No Known Allergies  Current Outpatient Medications  Medication Sig Dispense Refill  . albuterol (PROAIR HFA) 108 (90 BASE) MCG/ACT inhaler Inhale 2 puffs into the lungs every 6 (six) hours as needed. Shortness of breath 1 Inhaler 3  . aspirin 81 MG tablet Take 1 tablet (81 mg total) by mouth daily. 30 tablet 3  . lisinopril (ZESTRIL) 10 MG tablet Take 10 mg by mouth daily.     . metFORMIN (GLUCOPHAGE) 500 MG tablet Take by mouth 2 (two) times daily with a meal.    . metoprolol tartrate (LOPRESSOR) 25 MG tablet Take 1 tablet (25 mg total) by mouth 2 (two) times daily. 180 tablet 1  . montelukast (SINGULAIR) 10 MG tablet Take 1 tablet by mouth as needed.     . Multiple Vitamin (MULTI-VITAMINS) TABS TAKE 1 TABLET BY MOUTH ONCE DAILY 30 tablet 3  .  nitroGLYCERIN (NITROSTAT) 0.4 MG SL tablet Place 1 tablet (0.4 mg total) under the tongue every 5 (five) minutes as needed. Chest pain 10 tablet 0  . NYAMYC powder Apply 1 Units topically 2 (two) times daily.    . simvastatin (ZOCOR) 40 MG tablet Take 1 tablet (40 mg total) by mouth at bedtime. 90 tablet 3  . spironolactone (ALDACTONE) 50 MG tablet Take 50 mg by mouth daily.     No current facility-administered medications for this visit.    REVIEW OF SYSTEMS:  [X]  denotes positive finding, [ ]  denotes negative finding Cardiac  Comments:  Chest pain or chest pressure:    Shortness of breath upon exertion:    Short of breath when lying flat:    Irregular heart rhythm:        Vascular    Pain in calf, thigh, or hip brought on by ambulation:    Pain in feet at night that wakes you up from your sleep:     Blood clot in your veins:    Leg swelling:  x       Pulmonary    Oxygen at home:    Productive cough:     Wheezing:         Neurologic    Sudden weakness in arms or legs:     Sudden numbness in arms or legs:     Sudden onset of difficulty speaking or slurred speech:    Temporary loss of vision in one eye:     Problems with dizziness:         Gastrointestinal    Blood in stool:     Vomited blood:         Genitourinary    Burning when urinating:     Blood in urine:        Psychiatric    Major depression:         Hematologic    Bleeding problems:    Problems with blood clotting too easily:        Skin    Rashes or ulcers:        Constitutional    Fever or chills:     PHYSICAL EXAM:   Vitals:   09/15/19 1553  BP: (!) 159/83  Pulse: (!) 50  Resp: 16  Temp: (!) 96.6 F (35.9 C)  TempSrc: Temporal  SpO2: 98%  Weight: 232 lb (105.2 kg)  Height: 5\' 4"  (1.626 m)    GENERAL: The patient is a well-nourished female, in no acute distress. The vital signs  are documented above. CARDIAC: There is a regular rate and rhythm.  VASCULAR: I do not detect carotid  bruits. She has palpable pedal pulses. She does have some hyperpigmentation in the left leg. I looked at her left great saphenous vein myself with the SonoSite.  She has significant reflux in the proximal vein.  The vein is markedly dilated.  In the proximal to mid thigh this feeds a large cluster of varicose veins which are deeper and cannot be visualized on physical exam.  Below the takeoff of this large tributary it is more difficult to follow the great saphenous vein. PULMONARY: There is good air exchange bilaterally without wheezing or rales. ABDOMEN: Soft and non-tender with normal pitched bowel sounds.  MUSCULOSKELETAL: There are no major deformities or cyanosis. NEUROLOGIC: No focal weakness or paresthesias are detected. SKIN: There are no ulcers or rashes noted. PSYCHIATRIC: The patient has a normal affect.  DATA:    VENOUS DUPLEX: I reviewed her previous venous duplex scan.  On the left side she had no evidence of DVT.  She had deep venous reflux involving the common femoral vein.  She had superficial venous reflux in the left great saphenous vein from the saphenofemoral junction to the knee.  MEDICAL ISSUES:   CHRONIC VENOUS INSUFFICIENCY: This patient has superficial and deep venous reflux on the left. Her pantyhose style 20-30 compression stockings have helped her symptoms significantly.  She does continue to have some aching pain and heaviness however her symptoms have improved.  In addition she continues conservative treatment including leg elevation and exercise.  Given that her symptoms have improved I think we can hold off on laser ablation of the left great saphenous vein.  If her symptoms progress I think we could perform laser ablation of the proximal left great saphenous vein before it feeds this large tributary.  She will call if her symptoms progress.  In the meantime she will continue with her compression stockings, leg elevation, exercise, and weight  management.  Deitra Mayo Beeper [phone]

## 2019-12-01 ENCOUNTER — Telehealth: Payer: Self-pay

## 2019-12-01 MED ORDER — METOPROLOL TARTRATE 25 MG PO TABS: 25.0000 mg | ORAL_TABLET | Freq: Two times a day (BID) | ORAL | 1 refills | Status: DC

## 2019-12-01 NOTE — Telephone Encounter (Signed)
Patient has an upcoming appointment on 12/14/19 and need a refill of her metoprolol 45mg 

## 2019-12-06 ENCOUNTER — Ambulatory Visit: Payer: Medicare Other | Admitting: Cardiology

## 2019-12-14 ENCOUNTER — Ambulatory Visit: Payer: Medicare Other | Admitting: Cardiology

## 2019-12-14 ENCOUNTER — Other Ambulatory Visit: Payer: Self-pay

## 2019-12-14 ENCOUNTER — Encounter: Payer: Self-pay | Admitting: Cardiology

## 2019-12-14 VITALS — BP 144/85 | HR 59 | Temp 97.4°F | Ht 64.0 in | Wt 230.0 lb

## 2019-12-14 DIAGNOSIS — I2510 Atherosclerotic heart disease of native coronary artery without angina pectoris: Secondary | ICD-10-CM

## 2019-12-14 DIAGNOSIS — I10 Essential (primary) hypertension: Secondary | ICD-10-CM

## 2019-12-14 DIAGNOSIS — I429 Cardiomyopathy, unspecified: Secondary | ICD-10-CM

## 2019-12-14 DIAGNOSIS — Z6839 Body mass index (BMI) 39.0-39.9, adult: Secondary | ICD-10-CM

## 2019-12-14 DIAGNOSIS — E782 Mixed hyperlipidemia: Secondary | ICD-10-CM

## 2019-12-14 DIAGNOSIS — E119 Type 2 diabetes mellitus without complications: Secondary | ICD-10-CM

## 2019-12-14 MED ORDER — NITROGLYCERIN 0.4 MG SL SUBL: 0.4000 mg | SUBLINGUAL_TABLET | SUBLINGUAL | 0 refills | Status: AC | PRN

## 2019-12-14 NOTE — Progress Notes (Signed)
[name] Date of Birth: Mar 07, 1956 MRN: [id number] Primary Care Provider:Smith, Malva Limes., FNP Former Cardiology Providers: Jeri Lager, APRN, FNP-C  Primary Cardiologist: Rex Kras, DO, [facility] (established care 12/14/2019)  Date: 12/14/19 Last Office Visit: 06/07/2019  Chief Complaint  Patient presents with  . Coronary Artery Disease    6 month follow up, need nitro refill    HPI  Brittany Evans is a 64 y.o.  female who presents to the office with a chief complaint of " coronary disease follow-up." Patient's past medical history and cardiovascular risk factors include: Established coronary artery disease, recovered cardiomyopathy, non-insulin-dependent diabetes mellitus type 2, Hypertension, hyperlipidemia, obesity due to excess calories, postmenopausal female.  Patient was last seen in the office back in October 2020 by Jeri Lager, APRN, FNP-C at that time patient was doing well from a cardiovascular standpoint she was recommended to work on lifestyle modifications to improve her modifiable cardiovascular risk factors.  Since last office visit patient states that she is doing well.  She had intentions to lose about 20 pounds until today's office visit but instead has gained 4 pounds.  She states that she has decreased her physical activity due to COVID-19 pandemic.  She denies any chest pain or shortness of breath at rest or with effort related activities.  She is compliant with her medications.  Patient is requesting a refill on nitro.  FUNCTIONAL STATUS: Walks one mile per week.    ALLERGIES: No Known Allergies   MEDICATION LIST PRIOR TO VISIT: Current Outpatient Medications on File Prior to Visit  Medication Sig Dispense Refill  . aspirin 81 MG tablet Take 1 tablet (81 mg total) by mouth daily. 30 tablet 3  . glipiZIDE (GLUCOTROL) 10 MG tablet Take 10 mg by mouth daily before breakfast.    . lisinopril (ZESTRIL) 10 MG tablet Take 10 mg by mouth daily.      . metFORMIN (GLUCOPHAGE) 500 MG tablet Take 1,000 mg by mouth 2 (two) times daily with a meal.     . metoprolol tartrate (LOPRESSOR) 25 MG tablet Take 1 tablet (25 mg total) by mouth 2 (two) times daily. 180 tablet 1  . montelukast (SINGULAIR) 10 MG tablet Take 1 tablet by mouth as needed.     . Multiple Vitamin (MULTI-VITAMINS) TABS TAKE 1 TABLET BY MOUTH ONCE DAILY 30 tablet 3  . NYAMYC powder Apply 1 Units topically 2 (two) times daily.    . simvastatin (ZOCOR) 40 MG tablet Take 1 tablet (40 mg total) by mouth at bedtime. 90 tablet 3  . spironolactone (ALDACTONE) 50 MG tablet Take 50 mg by mouth daily.     No current facility-administered medications on file prior to visit.    PAST MEDICAL HISTORY: Past Medical History:  Diagnosis Date  . Asthma    inhaler use as needed rarely  . CAD (coronary artery disease)    NSTEMI 2/12: LAD 30%, D1 40%, oCFX 70-80%, mRCA 10-20%, ?occl. of apical LAD; treated medically  . CHF (congestive heart failure) ([place])   . Chronic kidney disease    stage 3 chronic kidney disease- pt states she is asymptomatic  . Complication of anesthesia    Lightheaded for several days  . Diabetes mellitus   . Dysrhythmia   . Hyperlipidemia   . Hypertension   . Myocardial infarction ([place])   . Neuromuscular disorder (HCC)    neuropathy due to DM  . NICM (nonischemic cardiomyopathy) ([place])    EF 20-25%, 45% 2012  .  Obesity   . Peripheral vascular disease ([place])   . Systolic CHF ([place])     PAST SURGICAL HISTORY: Past Surgical History:  Procedure Laterality Date  . COLONOSCOPY    . DILATATION & CURETTAGE/HYSTEROSCOPY WITH MYOSURE N/A 07/18/2014   Procedure: DILATATION & CURETTAGE/HYSTEROSCOPY WITH MYOSURE;  Surgeon: Woodroe Mode, MD;  Location: [facility];  Service: Gynecology;  Laterality: N/A;  . DILATATION & CURRETTAGE/HYSTEROSCOPY WITH RESECTOCOPE N/A 04/12/2015   Procedure:  DIAGNOSTIC HYSTEROSCOPY WITH RESECTION OF FIBROID AND POLYPS;  Surgeon: Servando Salina, MD;  Location: [facility];  Service: Gynecology;  Laterality: N/A;  . DILATION AND CURETTAGE OF UTERUS N/A 04/12/2015   Procedure: DILATATION AND CURETTAGE;  Surgeon: Servando Salina, MD;  Location: [facility];  Service: Gynecology;  Laterality: N/A;  . NO PAST SURGERIES    . None    . POLYPECTOMY N/A 07/18/2014   Procedure: POLYPECTOMY;  Surgeon: Woodroe Mode, MD;  Location: [facility];  Service: Gynecology;  Laterality: N/A;  . WISDOM TOOTH EXTRACTION      FAMILY HISTORY: The patient's family history includes Alcohol abuse in her father; Breast cancer in her cousin; Cancer in her mother and sister; Cirrhosis in her father; Coronary artery disease in her mother; Diabetes in her mother; Heart disease in her father and mother; Lung disease in her sister; Pancreatic cancer in her maternal grandmother.   SOCIAL HISTORY:  The patient  reports that she has never smoked. She has never used smokeless tobacco. She reports that she does not drink alcohol or use drugs.  Review of Systems  Constitution: Negative for chills and fever.  HENT: Negative for hoarse voice and nosebleeds.   Eyes: Negative for discharge, double vision and pain.  Cardiovascular: Negative for chest pain, claudication, dyspnea on exertion, leg swelling, near-syncope, orthopnea, palpitations, paroxysmal nocturnal dyspnea and syncope.  Respiratory: Negative for hemoptysis and shortness of breath.   Musculoskeletal: Negative for muscle cramps and myalgias.  Gastrointestinal: Negative for abdominal pain, constipation, diarrhea, hematemesis, hematochezia, melena, nausea and vomiting.  Neurological: Negative for dizziness and light-headedness.    PHYSICAL EXAM: Vitals with BMI 12/14/2019 09/15/2019 06/07/2019  Height 5\' 4"  5\' 4"  5\' 4"   Weight 230 lbs 232 lbs 226 lbs  BMI 39.46 [id number]  Systolic 123456 [id number]  Diastolic 85 83 80  Pulse 59 50 60   CONSTITUTIONAL: Well-developed and well-nourished. No acute distress.  SKIN:  Skin is warm and dry. No rash noted. No cyanosis. No pallor. No jaundice HEAD: Normocephalic and atraumatic.  EYES: No scleral icterus MOUTH/THROAT: Moist oral membranes.  NECK: No JVD present. No thyromegaly noted. No carotid bruits  LYMPHATIC: No visible cervical adenopathy.  CHEST Normal respiratory effort. No intercostal retractions  LUNGS: Clear to auscultation bilaterally.  No stridor. No wheezes. No rales.  CARDIOVASCULAR: Regular rate and rhythm, positive S1-S2, no murmurs rubs or gallops appreciated. ABDOMINAL: Obese, soft, nontender, nondistended, positive bowel sounds all 4 quadrants.  No apparent ascites.  EXTREMITIES: No peripheral edema  HEMATOLOGIC: No significant bruising NEUROLOGIC: Oriented to person, place, and time. Nonfocal. Normal muscle tone.  PSYCHIATRIC: Normal mood and affect. Normal behavior. Cooperative  CARDIAC DATABASE: EKG: 04/26/2019: Marked sinus bradycardia at 48 bpm, normal axis, LVH. T wave inversion in lateral leads, cannot exclude ischemia. 12/14/2019: Sinus bradycardia, 59 bpm, normal axis deviation, nonspecific T wave abnormality, baseline artifact in the lateral precordial leads.  Echocardiogram: 09/02/2017: LVEF 57%, normal global wall motion, grade 1 diastolic impairment, mild MR, mild TR.  Stress Testing:  None  Heart Catheterization: Coronary angiogram 2/12: LAD 30%, D1 40%, oCFX 70-80%, mRCA 10-20%, ?occl. of apical LAD; Medical therapy. LVEF 30%  LABORATORY DATA: CBC Latest Ref Rng & Units 04/11/2015 07/17/2014 03/11/2013  WBC 4.0 - 10.5 K/uL 5.1 5.0 4.1  Hemoglobin 12.0 - 15.0 g/dL 13.8 13.4 15.4(H)  Hematocrit 36.0 - 46.0 % 40.8 39.1 45.9  Platelets 150 - 400 K/uL 237 214 252    CMP Latest Ref Rng & Units 04/11/2015 07/17/2014 04/29/2013  Glucose 65 - 99 mg/dL 112(H) 91 132(H)  BUN 6 - 20 mg/dL 22(H) 17 20  Creatinine 0.44 - 1.00 mg/dL 1.34(H) 1.33(H) 1.23(H)  Sodium 135 - 145 mmol/L 139 140 139  Potassium 3.5 - 5.1 mmol/L 4.0 4.2  4.1  Chloride 101 - 111 mmol/L 107 105 109  CO2 22 - 32 mmol/L 23 23 26   Calcium 8.9 - 10.3 mg/dL 9.8 9.6 9.4  Total Protein 6.0 - 8.3 g/dL - - -  Total Bilirubin 0.3 - 1.2 mg/dL - - -  Alkaline Phos 39 - 117 U/L - - -  AST 0 - 37 U/L - - -  ALT 0 - 35 U/L - - -    Lipid Panel     Component Value Date/Time   CHOL 130 06/14/2013 1357   TRIG 108 06/14/2013 1357   HDL 50 06/14/2013 1357   CHOLHDL 2.6 06/14/2013 1357   VLDL 22 06/14/2013 1357   LDLCALC 58 06/14/2013 1357    Lab Results  Component Value Date   HGBA1C 7.2 01/02/2014   HGBA1C 7.4 09/29/2013   HGBA1C 7.4 06/14/2013   No components found for: NTPROBNP Lab Results  Component Value Date   TSH 1.991 03/11/2013   TSH 1.602 05/03/2011   TSH 1.962 10/02/2010    Cardiac Panel (last 3 results) No results for input(s): CKTOTAL, CKMB, TROPONINIHS, RELINDX in the last 72 hours.  FINAL MEDICATION LIST END OF ENCOUNTER: Meds ordered this encounter  Medications  . nitroGLYCERIN (NITROSTAT) 0.4 MG SL tablet    Sig: Place 1 tablet (0.4 mg total) under the tongue every 5 (five) minutes as needed. If you require more than two tablets five minutes apart call 911 and go to nearest ER via EMS.    Dispense:  10 tablet    Refill:  0      Current Outpatient Medications:  .  aspirin 81 MG tablet, Take 1 tablet (81 mg total) by mouth daily., Disp: 30 tablet, Rfl: 3 .  glipiZIDE (GLUCOTROL) 10 MG tablet, Take 10 mg by mouth daily before breakfast., Disp: , Rfl:  .  lisinopril (ZESTRIL) 10 MG tablet, Take 10 mg by mouth daily. , Disp: , Rfl:  .  metFORMIN (GLUCOPHAGE) 500 MG tablet, Take 1,000 mg by mouth 2 (two) times daily with a meal. , Disp: , Rfl:  .  metoprolol tartrate (LOPRESSOR) 25 MG tablet, Take 1 tablet (25 mg total) by mouth 2 (two) times daily., Disp: 180 tablet, Rfl: 1 .  montelukast (SINGULAIR) 10 MG tablet, Take 1 tablet by mouth as needed. , Disp: , Rfl:  .  Multiple Vitamin (MULTI-VITAMINS) TABS, TAKE 1 TABLET BY  MOUTH ONCE DAILY, Disp: 30 tablet, Rfl: 3 .  nitroGLYCERIN (NITROSTAT) 0.4 MG SL tablet, Place 1 tablet (0.4 mg total) under the tongue every 5 (five) minutes as needed. If you require more than two tablets five minutes apart call 911 and go to nearest ER via EMS., Disp: 10 tablet, Rfl: 0 .  [place]  powder, Apply 1 Units topically 2 (two) times daily., Disp: , Rfl:  .  simvastatin (ZOCOR) 40 MG tablet, Take 1 tablet (40 mg total) by mouth at bedtime., Disp: 90 tablet, Rfl: 3 .  spironolactone (ALDACTONE) 50 MG tablet, Take 50 mg by mouth daily., Disp: , Rfl:   IMPRESSION:    ICD-10-CM   1. Atherosclerosis of native coronary artery of native heart without angina pectoris  I25.10 EKG 12-Lead    nitroGLYCERIN (NITROSTAT) 0.4 MG SL tablet  2. Recovered cardiomyopathy  I42.9   3. Benign hypertension  I10   4. Mixed hyperlipidemia  E78.2   5. Non-insulin dependent type 2 diabetes mellitus ([place])  E11.9   6. Class 2 severe obesity due to excess calories with serious comorbidity and body mass index (BMI) of 39.0 to 39.9 in adult [facility])  E66.01    Z68.39      RECOMMENDATIONS: Brittany Evans is a 64 y.o. female whose past medical history and cardiovascular risk factors include: Established coronary artery disease, recovered cardiomyopathy, non-insulin-dependent diabetes mellitus type 2, Hypertension, hyperlipidemia, obesity due to excess calories, postmenopausal female.  Established coronary artery disease without angina pectoris:  Since last office visit patient has been doing well from a cardiovascular standpoint.  She does not have any active chest pain or shortness of breath at rest or with effort related activities.  Patient remains compliant with medical therapy.  Most recent echocardiogram from January 2019 reviewed with the patient.  Reeducated on the importance of working on her modifiable cardiovascular risk factors which include weight loss, glycemic control, and lipid  management.  Patient's blood pressure is currently being managed by her nephrologist.  She has an upcoming appointment.  I do not have the most recent lipid profile for review.  She states that her lipids are being followed by her primary care provider.  Would recommend an LDL level of less than 70 mg/dL given her history of non-insulin-dependent diabetes mellitus type 2.  For now would recommend increasing physical activity as tolerated with a goal of 30 minutes a day 5 days a week.  Patient requesting a refill on sublingual nitroglycerin tablets.  At the next office visit plan to order echo and stress test.  Follow-up in 6 months  Recovered cardiomyopathy: Continue guideline directed medical therapy.  Benign essential hypertension: Currently managed by nephrology.  Antihypertensive medications reconciled.  Non-insulin-dependent diabetes mellitus type 2: Currently managed by primary team.  Currently on oral antiglycemic agents.  Educated on importance of glycemic control given her history of CAD.  Obesity, due to excess calories: . Body mass index is 39.48 kg/m. . I reviewed with the patient the importance of diet, regular physical activity/exercise, weight loss.   . Patient is educated on increasing physical activity gradually as tolerated.  With the goal of moderate intensity exercise for 30 minutes a day 5 days a week.  Orders Placed This Encounter  Procedures  . EKG 12-Lead   --Continue cardiac medications as reconciled in final medication list. --Return in about 6 months (around 06/14/2020) for CAD and CMP re-evaluation. . Or sooner if needed. --Continue follow-up with your primary care physician regarding the management of your other chronic comorbid conditions.  Patient's questions and concerns were addressed to her satisfaction. She voices understanding of the instructions provided during this encounter.   During this visit I reviewed and updated: Tobacco history   allergies medication reconciliation  medical history  surgical history  family history  social history.  This note  was created using a voice recognition software as a result there may be grammatical errors inadvertently enclosed that do not reflect the nature of this encounter. Every attempt is made to correct such errors.  Rex Kras, [place], [facility]  Pager: [phone] Office: [phone]

## 2019-12-22 ENCOUNTER — Other Ambulatory Visit: Payer: Self-pay | Admitting: Family

## 2019-12-22 DIAGNOSIS — Z1231 Encounter for screening mammogram for malignant neoplasm of breast: Secondary | ICD-10-CM

## 2019-12-30 ENCOUNTER — Ambulatory Visit (HOSPITAL_COMMUNITY)
Admission: EM | Admit: 2019-12-30 | Discharge: 2019-12-30 | Disposition: A | Discharge status: Home / Self Care | Payer: Medicare HMO | Attending: Emergency Medicine | Admitting: Emergency Medicine

## 2019-12-30 ENCOUNTER — Other Ambulatory Visit: Payer: Self-pay

## 2019-12-30 ENCOUNTER — Ambulatory Visit (INDEPENDENT_AMBULATORY_CARE_PROVIDER_SITE_OTHER): Payer: Medicare HMO

## 2019-12-30 ENCOUNTER — Encounter (HOSPITAL_COMMUNITY): Payer: Self-pay

## 2019-12-30 DIAGNOSIS — S76012A Strain of muscle, fascia and tendon of left hip, initial encounter: Secondary | ICD-10-CM | POA: Diagnosis not present

## 2019-12-30 DIAGNOSIS — M25552 Pain in left hip: Secondary | ICD-10-CM

## 2019-12-30 DIAGNOSIS — I10 Essential (primary) hypertension: Secondary | ICD-10-CM

## 2019-12-30 DIAGNOSIS — X503XXA Overexertion from repetitive movements, initial encounter: Secondary | ICD-10-CM | POA: Diagnosis not present

## 2019-12-30 MED ORDER — TIZANIDINE HCL 2 MG PO TABS
2.0000 mg | ORAL_TABLET | Freq: Three times a day (TID) | ORAL | 0 refills | Status: DC | PRN
Start: 2019-12-30 — End: 2020-06-14

## 2019-12-30 NOTE — ED Provider Notes (Signed)
HPI  SUBJECTIVE:  Brittany Evans is a 64 y.o. female who presents with 2 weeks of anterior left hip pain located primarily in the groin with radiation down her anterior leg to her knee starting this morning.  Is intermittent, lasting hours.  She states that she started walking 30 minutes a day daily recently in an effort to lose weight.  She denies fevers, body aches, fall, trauma.  No back pain.  No rash.  No color or temperature changes.  No swelling in her groin.  She states that her knee is fine.  She has never had symptoms like this before.  She has tried massage, Voltaren cream, elevation and an unknown muscle relaxant.  Symptoms are better with massage, elevation, Voltaren cream, sitting in a cushioned chair, and having her hip flexed.  The pain is not associated with weightbearing.  No aggravating factors.  She has a past medical history of hypertension, coronary disease, chronic kidney disease stage III, CHF, hypercholesterolemia, diabetes, MI, peripheral vascular disease.  No history of cancer, prolonged steroid use, osteoporosis, AVN.  PMD: [name], FNP   Past Medical History:  Diagnosis Date  . Asthma    inhaler use as needed rarely  . CAD (coronary artery disease)    NSTEMI 2/12: LAD 30%, D1 40%, oCFX 70-80%, mRCA 10-20%, ?occl. of apical LAD; treated medically  . CHF (congestive heart failure) ([place])   . Chronic kidney disease    stage 3 chronic kidney disease- pt states she is asymptomatic  . Complication of anesthesia    Lightheaded for several days  . Diabetes mellitus   . Dysrhythmia   . Hyperlipidemia   . Hypertension   . Myocardial infarction ([place])   . Neuromuscular disorder ([facility])    neuropathy due to DM  . NICM (nonischemic cardiomyopathy) ([place])    EF 20-25%, 45% 2012  . Obesity   . Peripheral vascular disease ([place])   . Systolic CHF [facility])     Past Surgical History:  Procedure Laterality Date  . COLONOSCOPY    . DILATATION & CURETTAGE/HYSTEROSCOPY  WITH MYOSURE N/A 07/18/2014   Procedure: DILATATION & CURETTAGE/HYSTEROSCOPY WITH MYOSURE;  Surgeon: Woodroe Mode, MD;  Location: [facility];  Service: Gynecology;  Laterality: N/A;  . DILATATION & CURRETTAGE/HYSTEROSCOPY WITH RESECTOCOPE N/A 04/12/2015   Procedure:  DIAGNOSTIC HYSTEROSCOPY WITH RESECTION OF FIBROID AND POLYPS;  Surgeon: Servando Salina, MD;  Location: [facility];  Service: Gynecology;  Laterality: N/A;  . DILATION AND CURETTAGE OF UTERUS N/A 04/12/2015   Procedure: DILATATION AND CURETTAGE;  Surgeon: Servando Salina, MD;  Location: [facility];  Service: Gynecology;  Laterality: N/A;  . NO PAST SURGERIES    . None    . POLYPECTOMY N/A 07/18/2014   Procedure: POLYPECTOMY;  Surgeon: Woodroe Mode, MD;  Location: [facility];  Service: Gynecology;  Laterality: N/A;  . WISDOM TOOTH EXTRACTION      Family History  Problem Relation Age of Onset  . Cancer Mother   . Diabetes Mother   . Coronary artery disease Mother   . Heart disease Mother   . Cirrhosis Father        Alcohol abuse  . Alcohol abuse Father   . Heart disease Father   . Cancer Sister        lung  . Lung disease Sister   . Pancreatic cancer Maternal Grandmother   . Breast cancer Cousin   . Colon cancer Neg Hx   . Rectal cancer Neg Hx   .  Stomach cancer Neg Hx     Social History   Tobacco Use  . Smoking status: Never Smoker  . Smokeless tobacco: Never Used  Substance Use Topics  . Alcohol use: No  . Drug use: No    No current facility-administered medications for this encounter.  Current Outpatient Medications:  .  aspirin 81 MG tablet, Take 1 tablet (81 mg total) by mouth daily., Disp: 30 tablet, Rfl: 3 .  glipiZIDE (GLUCOTROL) 10 MG tablet, Take 10 mg by mouth daily before breakfast., Disp: , Rfl:  .  lisinopril (ZESTRIL) 10 MG tablet, Take 10 mg by mouth daily. , Disp: , Rfl:  .  metFORMIN (GLUCOPHAGE) 500 MG tablet, Take 1,000 mg by mouth 2 (two) times daily with a meal. , Disp: , Rfl:  .  metoprolol  tartrate (LOPRESSOR) 25 MG tablet, Take 1 tablet (25 mg total) by mouth 2 (two) times daily., Disp: 180 tablet, Rfl: 1 .  montelukast (SINGULAIR) 10 MG tablet, Take 1 tablet by mouth as needed. , Disp: , Rfl:  .  simvastatin (ZOCOR) 40 MG tablet, Take 1 tablet (40 mg total) by mouth at bedtime., Disp: 90 tablet, Rfl: 3 .  spironolactone (ALDACTONE) 50 MG tablet, Take 50 mg by mouth daily., Disp: , Rfl:  .  Multiple Vitamin (MULTI-VITAMINS) TABS, TAKE 1 TABLET BY MOUTH ONCE DAILY, Disp: 30 tablet, Rfl: 3 .  nitroGLYCERIN (NITROSTAT) 0.4 MG SL tablet, Place 1 tablet (0.4 mg total) under the tongue every 5 (five) minutes as needed. If you require more than two tablets five minutes apart call 911 and go to nearest ER via EMS., Disp: 10 tablet, Rfl: 0 .  NYAMYC powder, Apply 1 Units topically 2 (two) times daily., Disp: , Rfl:  .  tiZANidine (ZANAFLEX) 2 MG tablet, Take 1 tablet (2 mg total) by mouth every 8 (eight) hours as needed for muscle spasms., Disp: 30 tablet, Rfl: 0  No Known Allergies   ROS  As noted in HPI.   Physical Exam  BP (!) 161/101 (BP Location: Left Wrist)   Pulse 67   Temp 97.9 F (36.6 C) (Oral)   Resp 12   LMP  (LMP Unknown)   SpO2 98%   Constitutional: Well developed, well nourished, no acute distress Eyes:  EOMI, conjunctiva normal bilaterally HENT: Normocephalic, atraumatic,mucus membranes moist Respiratory: Normal inspiratory effort Cardiovascular: Normal rate GI: nondistended GU: No left-sided inguinal hernia. skin: No rash, bruising over left hip or proximal leg, skin intact Back: No L-spine tenderness. Musculoskeletal: No signs of trauma L hip. No bruising, erythema, rash.  Tenderness over the medial thigh and quadriceps.  No tenderness over gluteal muscles, down IT band,  No pain/pain with passive abduction/adduction of leg. No pain/pain with int/ext rotation hip.  No pain with active hip flexion against resistance.  No tenderness at sciatic notch.   Flexion/extension knee WNL.  Motor strength flexion/ext hip 5/5. Sensation to LT intact.  PT 2+ Neurologic: Alert & oriented x 3, no focal neuro deficits Psychiatric: Speech and behavior appropriate   ED Course   Medications - No data to display  Orders Placed This Encounter  Procedures  . DG Hip Unilat With Pelvis 2-3 Views Left    Standing Status:   Standing    Number of Occurrences:   1    Order Specific Question:   Symptom/Reason for Exam    Answer:   Left hip pain [id number]    Order Specific Question:   Radiology Contrast Protocol -  do NOT remove file path    Answer:   [email or web address]    No results found for this or any previous visit (from the past 24 hour(s)). DG Hip Unilat With Pelvis 2-3 Views Left  Result Date: 12/30/2019 CLINICAL DATA:  Left hip pain for 2 weeks with acute worsening today EXAM: DG HIP (WITH OR WITHOUT PELVIS) 2-3V LEFT COMPARISON:  None. FINDINGS: Bones of the pelvis are intact and congruent. No abnormal diastatic widening SI joints or symphysis pubis. Proximal femora are normally located. There are moderate degenerative changes about both acetabuli with some mild over coverage which could suggest a mild pincer deformity which may predispose the patient to femoroacetabular impingement. Enthesopathic changes are noted about the pelvis and bilateral femora. Additional degenerative changes of the lower lumbar spine and SI joints. No acute bony abnormality. Specifically, no fracture, subluxation, or dislocation. No suspicious osseous lesions. Vascular calcium in the pelvis. Remaining soft tissues are unremarkable. IMPRESSION: 1. No acute osseous abnormality. 2. Moderate degenerative changes about both acetabuli with some mild over coverage which could suggest a mild pincer deformity which may predispose the patient to femoroacetabular impingement. Correlate with clinical symptoms. Electronically Signed   By: Lovena Le M.D.    On: 12/30/2019 15:04    ED Clinical Impression  1. Left hip pain   2. Repetitive strain injury of left hip, initial encounter   3. Essential hypertension      ED Assessment/Plan  X-ray hip to rule out fracture, AVN.  She does not have any evidence of a hernia.  She has muscular tenderness over the abductors and rectus femoris.  Given that she has had increased activity, suspect overuse.  Does not seem to be a infectious problem, bursitis, vascular issue.  Reviewed imaging independently.  Mild degenerative changes in both acetabuli with some mild over coverage which may predispose the patient to for femoroacetabular impingement..  See radiology report for full details.  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 She may have some impingement in her hip.  Will send home with Tylenol 1000 mg 3-4 times a day as needed for pain, back off on the exercise for now, will try some Zanaflex.  Start with 2 mg, may increase to 4 mg 3 times daily.  We discussed steroids, but as she is diabetic, so we opted to defer this today.  Check in with PMD about using Voltaren gel.  Follow-up with orthopedics or sports medicine if not better in a week.  Blood pressure noted.  Did not take blood pressure medication today.  She has no symptoms of hypertensive emergency.  She is in mild amount of pain here.  Will advised her to keep an eye on this.  Follow-up with PMD if it remains elevated.  Discussed imaging, MDM, treatment plan, and plan for follow-up with patient. Discussed sn/sx that should prompt return to the ED. patient agrees with plan.   Meds ordered this encounter  Medications  . tiZANidine (ZANAFLEX) 2 MG tablet    Sig: Take 1 tablet (2 mg total) by mouth every 8 (eight) hours as needed for muscle spasms.    Dispense:  30 tablet    Refill:  0    *This clinic note was created using [employer]. Therefore, there may be occasional mistakes despite careful proofreading.   ?    Melynda Ripple,  MD 12/30/19 1621

## 2019-12-30 NOTE — Discharge Instructions (Addendum)
1000 mg of Tylenol 3-4 times a day as needed for pain.  Try the Zanaflex.  Start with 2 mg 3 times a day and if that does not work, you may increase to 4 mg 3 times a day.  Back off on your exercise for now.  Check in with your doctor about using the Voltaren gel.   Keep an eye on your blood pressure.  Follow-up with your doctor if it remains consistently elevated.

## 2019-12-30 NOTE — ED Triage Notes (Signed)
Patient c/o left hip pain that radiates down the left leg into the knee for 2 weeks. Patient has not taken her blood pressure medication today.

## 2020-06-14 ENCOUNTER — Encounter: Payer: Self-pay | Admitting: Cardiology

## 2020-06-14 ENCOUNTER — Ambulatory Visit: Payer: Medicare Other | Admitting: Cardiology

## 2020-06-14 ENCOUNTER — Other Ambulatory Visit: Payer: Self-pay

## 2020-06-14 VITALS — BP 139/80 | HR 61 | Ht 64.0 in | Wt 227.0 lb

## 2020-06-14 DIAGNOSIS — I2510 Atherosclerotic heart disease of native coronary artery without angina pectoris: Secondary | ICD-10-CM

## 2020-06-14 DIAGNOSIS — I10 Essential (primary) hypertension: Secondary | ICD-10-CM

## 2020-06-14 DIAGNOSIS — E119 Type 2 diabetes mellitus without complications: Secondary | ICD-10-CM

## 2020-06-14 DIAGNOSIS — E782 Mixed hyperlipidemia: Secondary | ICD-10-CM

## 2020-06-14 DIAGNOSIS — R9431 Abnormal electrocardiogram [ECG] [EKG]: Secondary | ICD-10-CM

## 2020-06-14 DIAGNOSIS — I429 Cardiomyopathy, unspecified: Secondary | ICD-10-CM

## 2020-06-14 DIAGNOSIS — E66812 Obesity, class 2: Secondary | ICD-10-CM

## 2020-06-14 NOTE — Progress Notes (Signed)
Brittany Evans Date of Birth: 19-Dec-1955 MRN: [id number] Primary Care Provider:Smith, Malva Limes., FNP Former Cardiology Providers: Brittany Lager, APRN, FNP-C  Primary Cardiologist: Brittany Kras, DO, [facility] (established care 12/14/2019)  Date: 06/14/2020 Last Office Visit: 12/14/2019  Chief Complaint  Patient presents with  . Coronary Artery Disease  . Follow-up    HPI  Brittany Evans is a 64 y.o.  female who presents to the office with a chief complaint of " coronary disease follow-up." Patient's past medical history and cardiovascular risk factors include: Established coronary artery disease, recovered cardiomyopathy, non-insulin-dependent diabetes mellitus type 2, Hypertension, hyperlipidemia, obesity due to excess calories, postmenopausal female.  Patient is being followed for the management of her underlying coronary artery disease and recovered cardiomyopathy.  She is here for 42-month follow-up for her underlying CAD and history of cardiomyopathy management.  Since last office visit she is doing well from a cardiovascular standpoint.  No hospitalizations or urgent care visits for cardiovascular symptoms since last office encounter.  No use of sublingual nitroglycerin tablets.  Patient states that she is compliant with her medical therapy.  Her overall functional status remains stable without any change in overall endurance.  FUNCTIONAL STATUS: Walks one mile per week.    ALLERGIES: No Known Allergies   MEDICATION LIST PRIOR TO VISIT: Current Outpatient Medications on File Prior to Visit  Medication Sig Dispense Refill  . Albuterol Sulfate (PROAIR RESPICLICK) 297 (90 Base) MCG/ACT AEPB Inhale into the lungs.    Marland Kitchen aspirin 81 MG tablet Take 1 tablet (81 mg total) by mouth daily. 30 tablet 3  . budesonide-formoterol (SYMBICORT) 80-4.5 MCG/ACT inhaler Inhale 2 puffs into the lungs 2 (two) times daily.    . cyclobenzaprine (FLEXERIL) 10 MG tablet Take 10 mg by mouth 3  (three) times daily as needed for muscle spasms.    Marland Kitchen glipiZIDE (GLUCOTROL) 10 MG tablet Take 10 mg by mouth daily before breakfast.    . lisinopril (ZESTRIL) 10 MG tablet Take 10 mg by mouth daily.     . metFORMIN (GLUCOPHAGE) 500 MG tablet Take 1,000 mg by mouth 2 (two) times daily with a meal.     . montelukast (SINGULAIR) 10 MG tablet Take 1 tablet by mouth as needed.     . Multiple Vitamin (MULTI-VITAMINS) TABS TAKE 1 TABLET BY MOUTH ONCE DAILY 30 tablet 3  . nitroGLYCERIN (NITROSTAT) 0.4 MG SL tablet Place 1 tablet (0.4 mg total) under the tongue every 5 (five) minutes as needed. If you require more than two tablets five minutes apart call 911 and go to nearest ER via EMS. 10 tablet 0  . NYAMYC powder Apply 1 Units topically 2 (two) times daily.    Marland Kitchen rOPINIRole (REQUIP) 0.5 MG tablet Take 0.5 mg by mouth 3 (three) times daily.    . simvastatin (ZOCOR) 40 MG tablet Take 1 tablet (40 mg total) by mouth at bedtime. 90 tablet 3  . spironolactone (ALDACTONE) 50 MG tablet Take 50 mg by mouth daily.    . metoprolol tartrate (LOPRESSOR) 25 MG tablet Take 1 tablet (25 mg total) by mouth 2 (two) times daily. 180 tablet 1   No current facility-administered medications on file prior to visit.    PAST MEDICAL HISTORY: Past Medical History:  Diagnosis Date  . Asthma    inhaler use as needed rarely  . CAD (coronary artery disease)    NSTEMI 2/12: LAD 30%, D1 40%, oCFX 70-80%, mRCA 10-20%, ?occl. of apical LAD; treated medically  .  CHF (congestive heart failure) ([place])   . Chronic kidney disease    stage 3 chronic kidney disease- pt states she is asymptomatic  . Complication of anesthesia    Lightheaded for several days  . Diabetes mellitus   . Dysrhythmia   . Hyperlipidemia   . Hypertension   . Myocardial infarction ([place])   . Neuromuscular disorder (HCC)    neuropathy due to DM  . NICM (nonischemic cardiomyopathy) ([place])    EF 20-25%, 45% 2012  . Obesity   . Peripheral vascular disease  ([place])   . Systolic CHF ([place])     PAST SURGICAL HISTORY: Past Surgical History:  Procedure Laterality Date  . COLONOSCOPY    . DILATATION & CURETTAGE/HYSTEROSCOPY WITH MYOSURE N/A 07/18/2014   Procedure: DILATATION & CURETTAGE/HYSTEROSCOPY WITH MYOSURE;  Surgeon: Woodroe Mode, MD;  Location: [facility];  Service: Gynecology;  Laterality: N/A;  . DILATATION & CURRETTAGE/HYSTEROSCOPY WITH RESECTOCOPE N/A 04/12/2015   Procedure:  DIAGNOSTIC HYSTEROSCOPY WITH RESECTION OF FIBROID AND POLYPS;  Surgeon: Servando Salina, MD;  Location: [facility];  Service: Gynecology;  Laterality: N/A;  . DILATION AND CURETTAGE OF UTERUS N/A 04/12/2015   Procedure: DILATATION AND CURETTAGE;  Surgeon: Servando Salina, MD;  Location: [facility];  Service: Gynecology;  Laterality: N/A;  . NO PAST SURGERIES    . None    . POLYPECTOMY N/A 07/18/2014   Procedure: POLYPECTOMY;  Surgeon: Woodroe Mode, MD;  Location: [facility];  Service: Gynecology;  Laterality: N/A;  . WISDOM TOOTH EXTRACTION      FAMILY HISTORY: The patient's family history includes Alcohol abuse in her father; Breast cancer in her cousin; Cancer in her mother and sister; Cirrhosis in her father; Coronary artery disease in her mother; Diabetes in her mother; Heart disease in her father and mother; Lung disease in her sister; Pancreatic cancer in her maternal grandmother.   SOCIAL HISTORY:  The patient  reports that she has never smoked. She has never used smokeless tobacco. She reports that she does not drink alcohol and does not use drugs.  Review of Systems  Constitutional: Negative for chills and fever.  HENT: Negative for hoarse voice and nosebleeds.   Eyes: Negative for discharge, double vision and pain.  Cardiovascular: Negative for chest pain, claudication, dyspnea on exertion, leg swelling, near-syncope, orthopnea, palpitations, paroxysmal nocturnal dyspnea and syncope.  Respiratory: Negative for hemoptysis and shortness of breath.    Musculoskeletal: Negative for muscle cramps and myalgias.  Gastrointestinal: Negative for abdominal pain, constipation, diarrhea, hematemesis, hematochezia, melena, nausea and vomiting.  Neurological: Negative for dizziness and light-headedness.    PHYSICAL EXAM: Vitals with BMI 06/14/2020 12/30/2019 12/14/2019  Height 5\' 4"  - 5\' 4"   Weight 227 lbs - 230 lbs  BMI 62.22 - 97.98  Systolic 921 194 174  Diastolic 80 081 85  Pulse 61 67 59   CONSTITUTIONAL: Well-developed and well-nourished. No acute distress.  SKIN: Skin is warm and dry. No rash noted. No cyanosis. No pallor. No jaundice HEAD: Normocephalic and atraumatic.  EYES: No scleral icterus MOUTH/THROAT: Moist oral membranes.  NECK: No JVD present. No thyromegaly noted. No carotid bruits  LYMPHATIC: No visible cervical adenopathy.  CHEST Normal respiratory effort. No intercostal retractions  LUNGS: Clear to auscultation bilaterally.  No stridor. No wheezes. No rales.  CARDIOVASCULAR: Regular rate and rhythm, positive S1-S2, no murmurs rubs or gallops appreciated. ABDOMINAL: Obese, soft, nontender, nondistended, positive bowel sounds all 4 quadrants.  No apparent ascites.  EXTREMITIES: No peripheral edema  HEMATOLOGIC:  No significant bruising NEUROLOGIC: Oriented to person, place, and time. Nonfocal. Normal muscle tone.  PSYCHIATRIC: Normal mood and affect. Normal behavior. Cooperative  CARDIAC DATABASE: EKG: 06/14/2020: Normal sinus rhythm, 61 bpm, T wave inversions in the high lateral and lateral leads suggestive of possible anterolateral ischemia, without underlying injury pattern.  No significant change compared to EKG from 04/26/2019.  Echocardiogram: 09/02/2017: LVEF 57%, normal global wall motion, grade 1 diastolic impairment, mild MR, mild TR.  Stress Testing:  None  Heart Catheterization: Coronary angiogram 2/12: LAD 30%, D1 40%, oCFX 70-80%, mRCA 10-20%, ?occl. of apical LAD; Medical therapy. LVEF 30%  LABORATORY  DATA: CBC Latest Ref Rng & Units 04/11/2015 07/17/2014 03/11/2013  WBC 4.0 - 10.5 K/uL 5.1 5.0 4.1  Hemoglobin 12.0 - 15.0 g/dL 13.8 13.4 15.4(H)  Hematocrit 36 - 46 % 40.8 39.1 45.9  Platelets 150 - 400 K/uL 237 214 252    CMP Latest Ref Rng & Units 04/11/2015 07/17/2014 04/29/2013  Glucose 65 - 99 mg/dL 112(H) 91 132(H)  BUN 6 - 20 mg/dL 22(H) 17 20  Creatinine 0.44 - 1.00 mg/dL 1.34(H) 1.33(H) 1.23(H)  Sodium 135 - 145 mmol/L 139 140 139  Potassium 3.5 - 5.1 mmol/L 4.0 4.2 4.1  Chloride 101 - 111 mmol/L 107 105 109  CO2 22 - 32 mmol/L 23 23 26   Calcium 8.9 - 10.3 mg/dL 9.8 9.6 9.4  Total Protein 6.0 - 8.3 g/dL - - -  Total Bilirubin 0.3 - 1.2 mg/dL - - -  Alkaline Phos 39 - 117 U/L - - -  AST 0 - 37 U/L - - -  ALT 0 - 35 U/L - - -    Lipid Panel     Component Value Date/Time   CHOL 130 06/14/2013 1357   TRIG 108 06/14/2013 1357   HDL 50 06/14/2013 1357   CHOLHDL 2.6 06/14/2013 1357   VLDL 22 06/14/2013 1357   LDLCALC 58 06/14/2013 1357    Lab Results  Component Value Date   HGBA1C 7.2 01/02/2014   HGBA1C 7.4 09/29/2013   HGBA1C 7.4 06/14/2013   No components found for: NTPROBNP Lab Results  Component Value Date   TSH 1.991 03/11/2013   TSH 1.602 05/03/2011   TSH 1.962 10/02/2010    Cardiac Panel (last 3 results) No results for input(s): CKTOTAL, CKMB, TROPONINIHS, RELINDX in the last 72 hours.  FINAL MEDICATION LIST END OF ENCOUNTER: No orders of the defined types were placed in this encounter.     Current Outpatient Medications:  .  Albuterol Sulfate (PROAIR RESPICLICK) 277 (90 Base) MCG/ACT AEPB, Inhale into the lungs., Disp: , Rfl:  .  aspirin 81 MG tablet, Take 1 tablet (81 mg total) by mouth daily., Disp: 30 tablet, Rfl: 3 .  budesonide-formoterol (SYMBICORT) 80-4.5 MCG/ACT inhaler, Inhale 2 puffs into the lungs 2 (two) times daily., Disp: , Rfl:  .  cyclobenzaprine (FLEXERIL) 10 MG tablet, Take 10 mg by mouth 3 (three) times daily as needed for muscle  spasms., Disp: , Rfl:  .  glipiZIDE (GLUCOTROL) 10 MG tablet, Take 10 mg by mouth daily before breakfast., Disp: , Rfl:  .  lisinopril (ZESTRIL) 10 MG tablet, Take 10 mg by mouth daily. , Disp: , Rfl:  .  metFORMIN (GLUCOPHAGE) 500 MG tablet, Take 1,000 mg by mouth 2 (two) times daily with a meal. , Disp: , Rfl:  .  montelukast (SINGULAIR) 10 MG tablet, Take 1 tablet by mouth as needed. , Disp: , Rfl:  .  Multiple Vitamin (MULTI-VITAMINS) TABS, TAKE 1 TABLET BY MOUTH ONCE DAILY, Disp: 30 tablet, Rfl: 3 .  nitroGLYCERIN (NITROSTAT) 0.4 MG SL tablet, Place 1 tablet (0.4 mg total) under the tongue every 5 (five) minutes as needed. If you require more than two tablets five minutes apart call 911 and go to nearest ER via EMS., Disp: 10 tablet, Rfl: 0 .  NYAMYC powder, Apply 1 Units topically 2 (two) times daily., Disp: , Rfl:  .  rOPINIRole (REQUIP) 0.5 MG tablet, Take 0.5 mg by mouth 3 (three) times daily., Disp: , Rfl:  .  simvastatin (ZOCOR) 40 MG tablet, Take 1 tablet (40 mg total) by mouth at bedtime., Disp: 90 tablet, Rfl: 3 .  spironolactone (ALDACTONE) 50 MG tablet, Take 50 mg by mouth daily., Disp: , Rfl:  .  metoprolol tartrate (LOPRESSOR) 25 MG tablet, Take 1 tablet (25 mg total) by mouth 2 (two) times daily., Disp: 180 tablet, Rfl: 1  IMPRESSION:    ICD-10-CM   1. Atherosclerosis of native coronary artery of native heart without angina pectoris  I25.10 EKG 12-Lead    PCV MYOCARDIAL PERFUSION WITH LEXISCAN  2. Recovered cardiomyopathy  I42.9 PCV MYOCARDIAL PERFUSION WITH LEXISCAN  3. Benign hypertension  I10   4. Mixed hyperlipidemia  E78.2   5. Non-insulin dependent type 2 diabetes mellitus ([place])  E11.9   6. Class 2 severe obesity due to excess calories with serious comorbidity and body mass index (BMI) of 39.0 to 39.9 in adult (HCC)  E66.01    Z68.39   7. Nonspecific abnormal electrocardiogram (ECG) (EKG)  R94.31 PCV MYOCARDIAL PERFUSION WITH LEXISCAN     RECOMMENDATIONS: LASHANTE FRYBERGER is a 64 y.o. female whose past medical history and cardiovascular risk factors include: Established coronary artery disease, recovered cardiomyopathy, non-insulin-dependent diabetes mellitus type 2, Hypertension, hyperlipidemia, obesity due to excess calories, postmenopausal female.  Established coronary artery disease without angina pectoris: Stable  Since last office visit patient has been stable from a cardiovascular standpoint.  She has not required the use of sublingual nitroglycerin tablets.  And her overall functional status remains stable as well.    Medications reconciled.  EKG today shows normal sinus rhythm with ST depressions in the high lateral and lateral leads suggestive of possible anterolateral ischemia.  Shared decision was to proceed with stress test to evaluate for reversible ischemia given her underlying history of coronary artery disease and multiple cardiovascular risk factors including diabetes.  Reeducated on the importance of working on her modifiable cardiovascular risk factors which include weight loss, glycemic control, and lipid management.  I do not have the most recent lipid profile for review.  She states that her lipids are being followed by her primary care provider.  Would recommend an LDL level of less than 70 mg/dL given her history of non-insulin-dependent diabetes mellitus type 2.  For now would recommend increasing physical activity as tolerated with a goal of 30 minutes a day 5 days a week.  Recovered cardiomyopathy: Continue guideline directed medical therapy.  Benign essential hypertension: Currently managed by nephrology.  Antihypertensive medications reconciled.  Non-insulin-dependent diabetes mellitus type 2: Currently managed by primary team.  Currently on oral antiglycemic agents.  Educated on importance of glycemic control given her history of CAD.  Obesity, due to excess calories: Body mass index is 38.96 kg/m. . I reviewed with  the patient the importance of diet, regular physical activity/exercise, weight loss.   . Patient is educated on increasing physical activity gradually as tolerated.  With the goal of moderate intensity exercise for 30 minutes a day 5 days a week.  I like to see her back in 46-month follow-up for management of CAD or sooner if her stress test is abnormal.  Also requested records from her PCPs office to get the most recent lab work for reference.  Orders Placed This Encounter  Procedures  . PCV MYOCARDIAL PERFUSION WITH LEXISCAN  . EKG 12-Lead   --Continue cardiac medications as reconciled in final medication list. --Return in about 6 months (around 12/13/2020) for Reevaluation of, CAD. Or sooner if needed. --Continue follow-up with your primary care physician regarding the management of your other chronic comorbid conditions.  Patient's questions and concerns were addressed to her satisfaction. She voices understanding of the instructions provided during this encounter.   This note was created using a voice recognition software as a result there may be grammatical errors inadvertently enclosed that do not reflect the nature of this encounter. Every attempt is made to correct such errors.  Total time spent 33 minutes.  Brittany Evans, [place], [facility]  Pager: [phone] Office: [phone]

## 2020-06-25 ENCOUNTER — Ambulatory Visit: Payer: Medicare Other

## 2020-06-25 ENCOUNTER — Other Ambulatory Visit: Payer: Self-pay

## 2020-06-25 DIAGNOSIS — R9431 Abnormal electrocardiogram [ECG] [EKG]: Secondary | ICD-10-CM

## 2020-06-25 DIAGNOSIS — I429 Cardiomyopathy, unspecified: Secondary | ICD-10-CM

## 2020-06-25 DIAGNOSIS — I2510 Atherosclerotic heart disease of native coronary artery without angina pectoris: Secondary | ICD-10-CM

## 2020-06-27 ENCOUNTER — Other Ambulatory Visit: Payer: Self-pay | Admitting: Cardiology

## 2020-06-27 ENCOUNTER — Other Ambulatory Visit: Payer: Self-pay

## 2020-06-27 DIAGNOSIS — I2510 Atherosclerotic heart disease of native coronary artery without angina pectoris: Secondary | ICD-10-CM

## 2020-06-27 DIAGNOSIS — I429 Cardiomyopathy, unspecified: Secondary | ICD-10-CM

## 2020-06-27 MED ORDER — METOPROLOL TARTRATE 25 MG PO TABS: 25.0000 mg | ORAL_TABLET | Freq: Two times a day (BID) | ORAL | 1 refills | Status: DC

## 2020-06-29 NOTE — Progress Notes (Signed)
Called pt no answer, left a vm

## 2020-07-06 ENCOUNTER — Telehealth: Payer: Self-pay

## 2020-07-06 NOTE — Progress Notes (Signed)
Called patient, female answered the phone, he said she would call back to the ofiice for the results.

## 2020-07-06 NOTE — Telephone Encounter (Signed)
Please call patient to schedule FU appt.

## 2020-07-06 NOTE — Telephone Encounter (Signed)
I see that an echo appt is made on 07/24/2020 have her follow up a week later. Thanks.

## 2020-07-06 NOTE — Telephone Encounter (Signed)
Patient called back, I explained that Dr. Terri Skains wanted her to reschedule the her echocardiogram. Call was transferred to the front staff to schedule that appointment.

## 2020-07-23 NOTE — Progress Notes (Signed)
Appointment is already scheduled for tomorrow if you want to leave it at that. AD/S

## 2020-07-24 ENCOUNTER — Ambulatory Visit: Payer: Medicare Other

## 2020-07-24 ENCOUNTER — Other Ambulatory Visit: Payer: Self-pay

## 2020-07-24 DIAGNOSIS — I429 Cardiomyopathy, unspecified: Secondary | ICD-10-CM

## 2020-07-24 DIAGNOSIS — I2510 Atherosclerotic heart disease of native coronary artery without angina pectoris: Secondary | ICD-10-CM

## 2020-08-07 ENCOUNTER — Encounter (HOSPITAL_COMMUNITY): Payer: Self-pay

## 2020-08-07 ENCOUNTER — Other Ambulatory Visit: Payer: Self-pay

## 2020-08-07 ENCOUNTER — Emergency Department (HOSPITAL_COMMUNITY)
Admission: EM | Admit: 2020-08-07 | Discharge: 2020-08-07 | Disposition: A | Discharge status: Home / Self Care | Payer: Medicare Other | Attending: Emergency Medicine | Admitting: Emergency Medicine

## 2020-08-07 ENCOUNTER — Telehealth: Payer: Self-pay | Admitting: Internal Medicine

## 2020-08-07 DIAGNOSIS — K922 Gastrointestinal hemorrhage, unspecified: Secondary | ICD-10-CM | POA: Insufficient documentation

## 2020-08-07 DIAGNOSIS — I130 Hypertensive heart and chronic kidney disease with heart failure and stage 1 through stage 4 chronic kidney disease, or unspecified chronic kidney disease: Secondary | ICD-10-CM | POA: Diagnosis not present

## 2020-08-07 DIAGNOSIS — Z79899 Other long term (current) drug therapy: Secondary | ICD-10-CM | POA: Diagnosis not present

## 2020-08-07 DIAGNOSIS — K921 Melena: Secondary | ICD-10-CM | POA: Diagnosis present

## 2020-08-07 DIAGNOSIS — J45909 Unspecified asthma, uncomplicated: Secondary | ICD-10-CM | POA: Insufficient documentation

## 2020-08-07 DIAGNOSIS — N183 Chronic kidney disease, stage 3 unspecified: Secondary | ICD-10-CM | POA: Diagnosis not present

## 2020-08-07 DIAGNOSIS — I2510 Atherosclerotic heart disease of native coronary artery without angina pectoris: Secondary | ICD-10-CM | POA: Insufficient documentation

## 2020-08-07 DIAGNOSIS — I5022 Chronic systolic (congestive) heart failure: Secondary | ICD-10-CM | POA: Insufficient documentation

## 2020-08-07 DIAGNOSIS — Z7984 Long term (current) use of oral hypoglycemic drugs: Secondary | ICD-10-CM | POA: Insufficient documentation

## 2020-08-07 DIAGNOSIS — Z7982 Long term (current) use of aspirin: Secondary | ICD-10-CM | POA: Diagnosis not present

## 2020-08-07 DIAGNOSIS — E1122 Type 2 diabetes mellitus with diabetic chronic kidney disease: Secondary | ICD-10-CM | POA: Diagnosis not present

## 2020-08-07 DIAGNOSIS — R55 Syncope and collapse: Secondary | ICD-10-CM | POA: Diagnosis not present

## 2020-08-07 LAB — CBC WITH DIFFERENTIAL/PLATELET
Abs Immature Granulocytes: 0.02 10*3/uL (ref 0.00–0.07)
Basophils Absolute: 0.1 10*3/uL (ref 0.0–0.1)
Basophils Relative: 1 %
Eosinophils Absolute: 0 10*3/uL (ref 0.0–0.5)
Eosinophils Relative: 1 %
HCT: 34.4 % — ABNORMAL LOW (ref 36.0–46.0)
Hemoglobin: 11.2 g/dL — ABNORMAL LOW (ref 12.0–15.0)
Immature Granulocytes: 0 %
Lymphocytes Relative: 23 %
Lymphs Abs: 1.4 10*3/uL (ref 0.7–4.0)
MCH: 29.2 pg (ref 26.0–34.0)
MCHC: 32.6 g/dL (ref 30.0–36.0)
MCV: 89.6 fL (ref 80.0–100.0)
Monocytes Absolute: 0.4 10*3/uL (ref 0.1–1.0)
Monocytes Relative: 7 %
Neutro Abs: 4.1 10*3/uL (ref 1.7–7.7)
Neutrophils Relative %: 68 %
Platelets: 207 10*3/uL (ref 150–400)
RBC: 3.84 MIL/uL — ABNORMAL LOW (ref 3.87–5.11)
RDW: 12.9 % (ref 11.5–15.5)
WBC: 6 10*3/uL (ref 4.0–10.5)
nRBC: 0 % (ref 0.0–0.2)

## 2020-08-07 LAB — BASIC METABOLIC PANEL
Anion gap: 10 (ref 5–15)
BUN: 29 mg/dL — ABNORMAL HIGH (ref 8–23)
CO2: 23 mmol/L (ref 22–32)
Calcium: 9.5 mg/dL (ref 8.9–10.3)
Chloride: 105 mmol/L (ref 98–111)
Creatinine, Ser: 1.47 mg/dL — ABNORMAL HIGH (ref 0.44–1.00)
GFR, Estimated: 40 mL/min — ABNORMAL LOW (ref >60)
Glucose, Bld: 157 mg/dL — ABNORMAL HIGH (ref 70–99)
Potassium: 4.2 mmol/L (ref 3.5–5.1)
Sodium: 138 mmol/L (ref 135–145)

## 2020-08-07 LAB — TYPE AND SCREEN
ABO/RH(D): B POS
Antibody Screen: NEGATIVE

## 2020-08-07 LAB — PROTIME-INR
INR: 1.1 (ref 0.8–1.2)
Prothrombin Time: 13.3 seconds (ref 11.4–15.2)

## 2020-08-07 LAB — HEPATIC FUNCTION PANEL
ALT: 22 U/L (ref 0–44)
AST: 18 U/L (ref 15–41)
Albumin: 4.1 g/dL (ref 3.5–5.0)
Alkaline Phosphatase: 54 U/L (ref 38–126)
Bilirubin, Direct: 0.1 mg/dL (ref 0.0–0.2)
Indirect Bilirubin: 0.6 mg/dL (ref 0.3–0.9)
Total Bilirubin: 0.7 mg/dL (ref 0.3–1.2)
Total Protein: 7.1 g/dL (ref 6.5–8.1)

## 2020-08-07 NOTE — Telephone Encounter (Signed)
GI ON CALL  EDP Dr. Roslynn Amble called regarding Dr. Hilarie Fredrickson patient who had 2 episodes of rectal bleeding yesterday (maroonish) with lightheadedness. No BM's since. No BM's today. VSS. Hg 11.2. Wants to go home with GI follow up. Had colon in 2015 with JMP showing diverticulosis. Told EDP to have patient call our office in am and we would reach out as well regarding follow up in the clinic. Patient was to be advised to return to hospital for significant recant bleeding.  Vaughan Basta, please call patient for clinical follow up and to arrange outpatient clinic follow up. Thanks   Docia Chuck. Geri [place]in[place]., M.D. [facility] Division of Gastroenterology

## 2020-08-07 NOTE — ED Triage Notes (Addendum)
Bib ems from PCP. Had bowel movement yesterday at which time she experienced dizziness and syncopal episode. When she came to she had thrown up. Pt had 2 HA's in 2012. Denies any episodes like this in her past. Also noted blood in stool. PCP sent her here because, "it was too much blood."  EMS vitals:  172/100 HR 90 100% ra RR 16 [id number] 97.1f

## 2020-08-07 NOTE — ED Provider Notes (Signed)
[facility] DEPT Provider Note   CSN: [id number] Arrival date & time: 08/07/20  1438     History Chief Complaint  Patient presents with  . Rectal Bleeding  . Loss of Consciousness    Brittany Evans is a 64 y.o. female.  Presents here with concern for syncopal episode and blood in stool.  Reports yesterday she had a bowel movement, felt somewhat flushed and lightheaded while getting to the bathroom.  Then thinks that she passed out.  Noted that she had blood in her stool, dark red.  No pain at the time.  Later in the evening had another episode of blood in stools, stool somewhat loose.  No further episodes of lightheadedness or syncope.  Today states that she was having no ongoing symptoms, no episodes of blood in stool.  Went to her primary care doctor, reports her primary doctor completed a rectal exam and was told that her stool tested positive for blood, appeared dark red but no other abnormality was noted.  HPI     Past Medical History:  Diagnosis Date  . Asthma    inhaler use as needed rarely  . CAD (coronary artery disease)    NSTEMI 2/12: LAD 30%, D1 40%, oCFX 70-80%, mRCA 10-20%, ?occl. of apical LAD; treated medically  . CHF (congestive heart failure) ([place])   . Chronic kidney disease    stage 3 chronic kidney disease- pt states she is asymptomatic  . Complication of anesthesia    Lightheaded for several days  . Diabetes mellitus   . Dysrhythmia   . Hyperlipidemia   . Hypertension   . Myocardial infarction ([place])   . Neuromuscular disorder (HCC)    neuropathy due to DM  . NICM (nonischemic cardiomyopathy) ([place])    EF 20-25%, 45% 2012  . Obesity   . Peripheral vascular disease ([place])   . Systolic CHF [facility])     Patient Active Problem List   Diagnosis Date Noted  . Plantar fasciitis of left foot 08/03/2014  . Fibroids 05/22/2014  . Postmenopausal bleeding 01/19/2014  . Diabetes ([place]) 01/02/2014  . HTN (hypertension) 01/02/2014   . Colon cancer screening 01/02/2014  . Type II or unspecified type diabetes mellitus with unspecified complication, uncontrolled 04/29/2013  . CKD (chronic kidney disease) stage 3, GFR 30-59 ml/min (HCC) 03/18/2013  . Dyslipidemia 03/11/2013  . SYSTOLIC HEART FAILURE, CHRONIC 10/21/2010  . CAD, NATIVE VESSEL 10/18/2010  . HYPERTENSION 01/18/2007    Past Surgical History:  Procedure Laterality Date  . COLONOSCOPY    . DILATATION & CURETTAGE/HYSTEROSCOPY WITH MYOSURE N/A 07/18/2014   Procedure: DILATATION & CURETTAGE/HYSTEROSCOPY WITH MYOSURE;  Surgeon: Woodroe Mode, MD;  Location: [facility];  Service: Gynecology;  Laterality: N/A;  . DILATATION & CURRETTAGE/HYSTEROSCOPY WITH RESECTOCOPE N/A 04/12/2015   Procedure:  DIAGNOSTIC HYSTEROSCOPY WITH RESECTION OF FIBROID AND POLYPS;  Surgeon: Servando Salina, MD;  Location: [facility];  Service: Gynecology;  Laterality: N/A;  . DILATION AND CURETTAGE OF UTERUS N/A 04/12/2015   Procedure: DILATATION AND CURETTAGE;  Surgeon: Servando Salina, MD;  Location: [facility];  Service: Gynecology;  Laterality: N/A;  . NO PAST SURGERIES    . None    . POLYPECTOMY N/A 07/18/2014   Procedure: POLYPECTOMY;  Surgeon: Woodroe Mode, MD;  Location: [facility];  Service: Gynecology;  Laterality: N/A;  . WISDOM TOOTH EXTRACTION       OB History    Gravida  5   Para  2   Term  1   Preterm  1   AB  3   Living  2     SAB      IAB  3   Ectopic      Multiple      Live Births  2           Family History  Problem Relation Age of Onset  . Cancer Mother   . Diabetes Mother   . Coronary artery disease Mother   . Heart disease Mother   . Cirrhosis Father        Alcohol abuse  . Alcohol abuse Father   . Heart disease Father   . Cancer Sister        lung  . Lung disease Sister   . Pancreatic cancer Maternal Grandmother   . Breast cancer Cousin   . Colon cancer Neg Hx   . Rectal cancer Neg Hx   . Stomach cancer Neg Hx     Social History    Tobacco Use  . Smoking status: Never Smoker  . Smokeless tobacco: Never Used  Vaping Use  . Vaping Use: Never used  Substance Use Topics  . Alcohol use: No  . Drug use: No    Home Medications Prior to Admission medications   Medication Sig Start Date End Date Taking? Authorizing Provider  ADVAIR HFA 115-21 MCG/ACT inhaler Inhale 2 puffs into the lungs 2 (two) times daily as needed (sob and wheezing). 07/20/20  Yes [provider]  aspirin 81 MG tablet Take 1 tablet (81 mg total) by mouth daily. 04/29/13  Yes Robbie Lis, MD  glipiZIDE (GLUCOTROL) 10 MG tablet Take 10 mg by mouth daily before breakfast.   Yes [provider]  lisinopril (ZESTRIL) 10 MG tablet Take 10 mg by mouth daily.  05/05/19  Yes [provider]  metFORMIN (GLUCOPHAGE) 500 MG tablet Take 500 mg by mouth 2 (two) times daily with a meal.   Yes [provider]  metoprolol tartrate (LOPRESSOR) 25 MG tablet Take 1 tablet (25 mg total) by mouth 2 (two) times daily. 06/27/20 09/25/20 Yes Adrian Prows, MD  montelukast (SINGULAIR) 10 MG tablet Take 1 tablet by mouth at bedtime. 01/14/19  Yes [provider]  Multiple Vitamin (MULTI-VITAMINS) TABS TAKE 1 TABLET BY MOUTH ONCE DAILY 09/06/13  Yes Robbie Lis, MD  nitroGLYCERIN (NITROSTAT) 0.4 MG SL tablet Place 1 tablet (0.4 mg total) under the tongue every 5 (five) minutes as needed. If you require more than two tablets five minutes apart call 911 and go to nearest ER via EMS. 12/14/19  Yes Tolia, Sunit, DO  NYAMYC powder Apply 1 Units topically 2 (two) times daily. 01/14/19  Yes [provider]  simvastatin (ZOCOR) 40 MG tablet Take 1 tablet (40 mg total) by mouth at bedtime. 01/04/14  Yes Tresa Garter, MD  spironolactone (ALDACTONE) 50 MG tablet Take 50 mg by mouth daily.   Yes [provider]  VENTOLIN HFA 108 (90 Base) MCG/ACT inhaler Inhale 1-2 puffs into the lungs every 6 (six) hours as needed for wheezing or  shortness of breath. 06/19/20  Yes [provider]    Allergies    Trulicity [dulaglutide]  Review of Systems   Review of Systems  Constitutional: Negative for chills and fever.  HENT: Negative for ear pain and sore throat.   Eyes: Negative for pain and visual disturbance.  Respiratory: Negative for cough and shortness of breath.   Cardiovascular: Negative for chest  pain and palpitations.  Gastrointestinal: Positive for blood in stool. Negative for abdominal pain and vomiting.  Genitourinary: Negative for dysuria and hematuria.  Musculoskeletal: Negative for arthralgias and back pain.  Skin: Negative for color change and rash.  Neurological: Positive for syncope and light-headedness. Negative for seizures.  All other systems reviewed and are negative.   Physical Exam Updated Vital Signs BP (!) 143/83   Pulse 62   Temp 98.7 F (37.1 C) (Oral)   Resp 18   LMP  (LMP Unknown)   SpO2 100%   Physical Exam Vitals and nursing note reviewed.  Constitutional:      General: She is not in acute distress.    Appearance: She is well-developed and well-nourished.  HENT:     Head: Normocephalic and atraumatic.  Eyes:     Conjunctiva/sclera: Conjunctivae normal.  Cardiovascular:     Rate and Rhythm: Normal rate and regular rhythm.     Heart sounds: No murmur heard.   Pulmonary:     Effort: Pulmonary effort is normal. No respiratory distress.     Breath sounds: Normal breath sounds.  Abdominal:     Palpations: Abdomen is soft.     Tenderness: There is no abdominal tenderness.  Musculoskeletal:        General: No deformity, signs of injury or edema.     Cervical back: Neck supple.  Skin:    General: Skin is warm and dry.  Neurological:     General: No focal deficit present.     Mental Status: She is alert and oriented to person, place, and time.  Psychiatric:        Mood and Affect: Mood and affect and mood normal.     ED Results / Procedures / Treatments    Labs (all labs ordered are listed, but only abnormal results are displayed) Labs Reviewed  CBC WITH DIFFERENTIAL/PLATELET - Abnormal; Notable for the following components:      Result Value   RBC 3.84 (*)    Hemoglobin 11.2 (*)    HCT 34.4 (*)    All other components within normal limits  BASIC METABOLIC PANEL - Abnormal; Notable for the following components:   Glucose, Bld 157 (*)    BUN 29 (*)    Creatinine, Ser 1.47 (*)    GFR, Estimated 40 (*)    All other components within normal limits  RESP PANEL BY RT-PCR (FLU A&B, COVID) ARPGX2  PROTIME-INR  HEPATIC FUNCTION PANEL  TYPE AND SCREEN    EKG EKG Interpretation  Date/Time:  Tuesday August 07 2020 15:43:21 EST Ventricular Rate:  55 PR Interval:    QRS Duration: 96 QT Interval:  419 QTC Calculation: 401 R Axis:   18 Text Interpretation: Sinus rhythm Abnormal T, consider ischemia, lateral leads Baseline wander in lead(s) V3 No significant change since last tracing Confirmed by Madalyn Rob [phone]) on 08/07/2020 4:46:59 PM   Radiology No results found.  Procedures Procedures (including critical care time)  Medications Ordered in ED Medications - No data to display  ED Course  I have reviewed the triage vital signs and the nursing notes.  Pertinent labs & imaging results that were available during my care of the patient were reviewed by me and considered in my medical decision making (see chart for details).  Clinical Course as of 08/08/20 0020  Tue Aug 07, 2020  1727 Hemoglobin(!): 11.2 Slightly worse than baseline from a few  years ago [RD]  1727 Creatinine(!): 1.47 [RD]  6160  BUN(!): 29 [RD]  1727 Will review with GI on call [RD]    Clinical Course User Index [RD] Lucrezia Starch, MD   MDM Rules/Calculators/A&P                         64 year old lady presenting to ER with concern for blood in stool, syncope.  2 episodes yesterday, none today, well-appearing with stable vital signs.  Blood  work noted for slight increase in creatinine, BUN and slight drop in hemoglobin.  Abdomen soft, nontender.  Reviewed case with gastroenterology, Dr. Henrene Pastor on-call for [facility].  Suspect mild diverticular bleed.  He recommends close follow-up in clinic.  Should patient have any additional episodes, then would recommend admission.  Regarding syncopal episode, suspect vasovagal.  No acute ischemic change on EKG, no ongoing symptoms here, no events on telemetry monitoring.  Patient agreeable, discharged home.   After the discussed management above, the patient was determined to be safe for discharge.  The patient was in agreement with this plan and all questions regarding their care were answered.  ED return precautions were discussed and the patient will return to the ED with any significant worsening of condition.  Final Clinical Impression(s) / ED Diagnoses Final diagnoses:  Lower GI bleed    Rx / DC Orders ED Discharge Orders    None       Lucrezia Starch, MD 08/08/20 0020

## 2020-08-07 NOTE — ED Notes (Signed)
Pt does not believe in getting transfusions

## 2020-08-07 NOTE — Discharge Instructions (Addendum)
Please call the gastroenterology office tomorrow morning.  You need a close follow-up appointment with them.  Please return to ER if you have any further episodes of blood in stool, dark, tarry stool, passing out or lightheadedness.

## 2020-08-08 NOTE — Telephone Encounter (Signed)
Pt scheduled to see Esmond Plants NP 08/14/20 at 1:30pm. Pt aware of appt. She knows to go back to the ER if the bleeding worsens or she has symptomatic anemia.

## 2020-08-08 NOTE — Telephone Encounter (Signed)
Thanks Dr. Inocencio Homes, agree with followup with me or APP as recommended by Dr. Henrene Pastor Thanks JMP

## 2020-08-14 ENCOUNTER — Ambulatory Visit: Payer: Medicare Other | Admitting: Nurse Practitioner

## 2020-09-18 ENCOUNTER — Other Ambulatory Visit: Payer: Self-pay | Admitting: Cardiology

## 2020-10-11 ENCOUNTER — Other Ambulatory Visit: Payer: Self-pay

## 2020-10-11 ENCOUNTER — Other Ambulatory Visit (INDEPENDENT_AMBULATORY_CARE_PROVIDER_SITE_OTHER): Payer: Self-pay

## 2020-10-11 ENCOUNTER — Encounter: Payer: Self-pay | Admitting: Gastroenterology

## 2020-10-11 ENCOUNTER — Ambulatory Visit (INDEPENDENT_AMBULATORY_CARE_PROVIDER_SITE_OTHER): Payer: Self-pay | Admitting: Gastroenterology

## 2020-10-11 VITALS — BP 130/80 | HR 85 | Ht 65.0 in | Wt 226.0 lb

## 2020-10-11 DIAGNOSIS — K625 Hemorrhage of anus and rectum: Secondary | ICD-10-CM

## 2020-10-11 DIAGNOSIS — R5383 Other fatigue: Secondary | ICD-10-CM

## 2020-10-11 LAB — CBC
HCT: 25.2 % — ABNORMAL LOW (ref 36.0–46.0)
Hemoglobin: 8 g/dL — CL (ref 12.0–15.0)
MCHC: 31.5 g/dL (ref 30.0–36.0)
MCV: 73.4 fl — ABNORMAL LOW (ref 78.0–100.0)
Platelets: 219 10*3/uL (ref 150.0–400.0)
RBC: 3.44 Mil/uL — ABNORMAL LOW (ref 3.87–5.11)
RDW: 20.7 % — ABNORMAL HIGH (ref 11.5–15.5)
WBC: 4.8 10*3/uL (ref 4.0–10.5)

## 2020-10-11 LAB — IBC + FERRITIN
Ferritin: 4.8 ng/mL — ABNORMAL LOW (ref 10.0–291.0)
Iron: 15 ug/dL — ABNORMAL LOW (ref 42–145)
Saturation Ratios: 2.9 % — ABNORMAL LOW (ref 20.0–50.0)
Transferrin: 374 mg/dL — ABNORMAL HIGH (ref 212.0–360.0)

## 2020-10-11 MED ORDER — PLENVU 140 G PO SOLR: 1.0000 | Freq: Once | ORAL | 0 refills | Status: AC

## 2020-10-11 NOTE — Patient Instructions (Addendum)
If you are age 64 or older, your body mass index should be between 23-30. Your Body mass index is 37.61 kg/m. If this is out of the aforementioned range listed, please consider follow up with your Primary Care Provider.  If you are age 38 or younger, your body mass index should be between 19-25. Your Body mass index is 37.61 kg/m. If this is out of the aformentioned range listed, please consider follow up with your Primary Care Provider.   Your provider has requested that you go to the basement level for lab work before leaving today. Press "B" on the elevator. The lab is located at the first door on the left as you exit the elevator.  Start daily probiotic such as Clinical cytogeneticist.   You have been scheduled for a colonoscopy. Please follow written instructions given to you at your visit today.  Please pick up your prep supplies at the pharmacy within the next 1-3 days. If you use inhalers (even only as needed), please bring them with you on the day of your procedure.  Due to recent changes in healthcare laws, you may see the results of your imaging and laboratory studies on MyChart before your provider has had a chance to review them.  We understand that in some cases there may be results that are confusing or concerning to you. Not all laboratory results come back in the same time frame and the provider may be waiting for multiple results in order to interpret others.  Please give [place] 48 hours in order for your provider to thoroughly review all the results before contacting the office for clarification of your results.

## 2020-10-11 NOTE — Progress Notes (Signed)
10/11/2020 Brittany Evans [id number] Mar 10, 1956   HISTORY OF PRESENT ILLNESS: This is a pleasant 65 year old female who is a patient of Dr. Vena Rua.  She has not been seen here since her colonoscopy in July 2015.  She presents here today for ER follow-up of rectal bleeding.  Back on December 21 she presented to the ER after having sudden onset of moderate to large volume rectal bleeding followed by an episode of syncope.  Her hemoglobin at that point was 11.2 g with nothing recent for comparison.  She had not passed any further blood while in the ER so was discharged home and told to follow-up here.  She tells me that that the bleeding tapered off and resolved, but that she has had a couple of recurrent episodes since then although not such large amounts.  I did receive some labs from her PCPs office and in June 2021 her hemoglobin was 13.4 g.  She says that overall she just feels fatigued and dizzy at times.  Says that her blood pressure runs low at times at home.  She reports generalized abdominal discomfort with gas and bloating.  She says that things just do not feel right.  Her last colonoscopy was in July 2015 at which time she was found to have moderate diverticulosis in the descending and sigmoid colon only.  Repeat was recommended a 10-year interval.  Referred here by Dustin Folks, NP, for rectal bleeding.   Past Medical History:  Diagnosis Date  . Asthma    inhaler use as needed rarely  . CAD (coronary artery disease)    NSTEMI 2/12: LAD 30%, D1 40%, oCFX 70-80%, mRCA 10-20%, ?occl. of apical LAD; treated medically  . CHF (congestive heart failure) ([place])   . Chronic kidney disease    stage 3 chronic kidney disease- pt states she is asymptomatic  . Complication of anesthesia    Lightheaded for several days  . Diabetes mellitus   . Dysrhythmia   . Hyperlipidemia   . Hypertension   . Myocardial infarction ([place])   . Neuromuscular disorder (HCC)    neuropathy due to DM  .  NICM (nonischemic cardiomyopathy) ([place])    EF 20-25%, 45% 2012  . Obesity   . Peripheral vascular disease ([place])   . Systolic CHF [facility])    Past Surgical History:  Procedure Laterality Date  . COLONOSCOPY    . DILATATION & CURETTAGE/HYSTEROSCOPY WITH MYOSURE N/A 07/18/2014   Procedure: DILATATION & CURETTAGE/HYSTEROSCOPY WITH MYOSURE;  Surgeon: Woodroe Mode, MD;  Location: [facility];  Service: Gynecology;  Laterality: N/A;  . DILATATION & CURRETTAGE/HYSTEROSCOPY WITH RESECTOCOPE N/A 04/12/2015   Procedure:  DIAGNOSTIC HYSTEROSCOPY WITH RESECTION OF FIBROID AND POLYPS;  Surgeon: Servando Salina, MD;  Location: [facility];  Service: Gynecology;  Laterality: N/A;  . DILATION AND CURETTAGE OF UTERUS N/A 04/12/2015   Procedure: DILATATION AND CURETTAGE;  Surgeon: Servando Salina, MD;  Location: [facility];  Service: Gynecology;  Laterality: N/A;  . NO PAST SURGERIES    . None    . POLYPECTOMY N/A 07/18/2014   Procedure: POLYPECTOMY;  Surgeon: Woodroe Mode, MD;  Location: [facility];  Service: Gynecology;  Laterality: N/A;  . WISDOM TOOTH EXTRACTION      reports that she has never smoked. She has never used smokeless tobacco. She reports that she does not drink alcohol and does not use drugs. family history includes Alcohol abuse in her father; Breast cancer in her cousin; Cancer in her mother and  sister; Cirrhosis in her father; Coronary artery disease in her mother; Diabetes in her mother; Heart disease in her father and mother; Lung disease in her sister; Pancreatic cancer in her maternal grandmother. Allergies  Allergen Reactions  . Trulicity [Dulaglutide] Nausea And Vomiting      Outpatient Encounter Medications as of 10/11/2020  Medication Sig  . ADVAIR HFA 115-21 MCG/ACT inhaler Inhale 2 puffs into the lungs 2 (two) times daily as needed (sob and wheezing).  Marland Kitchen aspirin 81 MG tablet Take 1 tablet (81 mg total) by mouth daily.  Marland Kitchen glipiZIDE (GLUCOTROL) 10 MG tablet Take 10 mg by mouth daily before  breakfast.  . lisinopril (ZESTRIL) 10 MG tablet Take 10 mg by mouth daily.   . metFORMIN (GLUCOPHAGE) 500 MG tablet Take 500 mg by mouth 2 (two) times daily with a meal.  . metoprolol tartrate (LOPRESSOR) 25 MG tablet TAKE 1 TABLET BY MOUTH TWICE DAILY  . montelukast (SINGULAIR) 10 MG tablet Take 1 tablet by mouth at bedtime.  . Multiple Vitamin (MULTI-VITAMINS) TABS TAKE 1 TABLET BY MOUTH ONCE DAILY  . nitroGLYCERIN (NITROSTAT) 0.4 MG SL tablet Place 1 tablet (0.4 mg total) under the tongue every 5 (five) minutes as needed. If you require more than two tablets five minutes apart call 911 and go to nearest ER via EMS.  Lou Cal powder Apply 1 Units topically 2 (two) times daily.  . simvastatin (ZOCOR) 40 MG tablet Take 1 tablet (40 mg total) by mouth at bedtime.  Marland Kitchen spironolactone (ALDACTONE) 50 MG tablet Take 50 mg by mouth daily.  . VENTOLIN HFA 108 (90 Base) MCG/ACT inhaler Inhale 1-2 puffs into the lungs every 6 (six) hours as needed for wheezing or shortness of breath.   No facility-administered encounter medications on file as of 10/11/2020.     REVIEW OF SYSTEMS  : All other systems reviewed and negative except where noted in the History of Present Illness.   PHYSICAL EXAM: BP 130/80   Pulse 85   Ht 5\' 5"  (1.651 m)   Wt 226 lb (102.5 kg)   LMP  (LMP Unknown)   BMI 37.61 kg/m  General: Well developed AA female in no acute distress Head: Normocephalic and atraumatic Eyes:  Sclerae anicteric, conjunctiva pink. Ears: Normal auditory acuity Lungs: Clear throughout to auscultation; no W/R/R. Heart: Regular rate and rhythm; no M/R/G. Abdomen: Soft, non-distended.  BS present.  Non-tender. Rectal:  No external abnormalities noted.  DRE did not reveal any masses.  There was a tiny amount of light brown stool on the exam glove that may have been trace Hemoccult positive, but it was hard to tell since it was such a small amount. Musculoskeletal: Symmetrical with no gross deformities   Skin: No lesions on visible extremities Extremities: No edema  Neurological: Alert oriented x 4, grossly non-focal Psychological:  Alert and cooperative. Normal mood and affect  ASSESSMENT AND PLAN: *Rectal bleeding and fatigue: Reports large volume episode of rectal bleeding back in December.  Since then she has had some recurrence of the bleeding, although not as severe.  Reports extreme fatigue and general unwell feeling with some dizziness.  Reports some bloating abdominal discomfort.  Rectal exam today did not reveal any cause of her bleeding.  Very tiny amount of light brown stool was possibly trace hemoccult positive, but hard to tell due to such small amount.  ? If the bleeding was diverticular in origin.  I am going to repeat a CBC today and check iron levels.  I think she needs a repeat colonoscopy since her last was almost 7 years ago.  We will schedule that with Dr. Hilarie Fredrickson.  I recommended that she begin taking a daily probiotic such as align or Florastor in the interim to see if that will help with any the gas and bloating.  The risks, benefits, and alternatives to colonoscopy were discussed with the patient and she consents to proceed.   CC:  [name], FNP

## 2020-10-12 ENCOUNTER — Encounter: Payer: Self-pay | Admitting: *Deleted

## 2020-10-12 ENCOUNTER — Other Ambulatory Visit: Payer: Self-pay | Admitting: *Deleted

## 2020-10-12 DIAGNOSIS — K625 Hemorrhage of anus and rectum: Secondary | ICD-10-CM

## 2020-10-12 DIAGNOSIS — R5383 Other fatigue: Secondary | ICD-10-CM

## 2020-10-12 NOTE — Addendum Note (Signed)
Addended by: Horris Latino on: 10/12/2020 08:38 AM   Modules accepted: Orders

## 2020-10-12 NOTE — Progress Notes (Signed)
am

## 2020-10-12 NOTE — Progress Notes (Signed)
Addendum: Reviewed and agree with assessment and management plan for colonoscopy Pyrtle, Lajuan Lines, MD

## 2020-10-15 ENCOUNTER — Telehealth: Payer: Self-pay | Admitting: Hematology and Oncology

## 2020-10-15 ENCOUNTER — Telehealth: Payer: Self-pay | Admitting: Gastroenterology

## 2020-10-15 NOTE — Telephone Encounter (Signed)
Spoke with patient and placed Plenvu sample up front for her to pick up.

## 2020-10-15 NOTE — Telephone Encounter (Signed)
Received a new hem referral from [facility] for anemia. Brittany Evans has been cld and scheduled to see Dr. Alvy Bimler on 3/7 at 2pm. Pt aware to arrive 30 minutes early.

## 2020-10-17 ENCOUNTER — Encounter: Payer: Self-pay | Admitting: Internal Medicine

## 2020-10-17 ENCOUNTER — Ambulatory Visit (AMBULATORY_SURGERY_CENTER): Payer: Medicare HMO | Admitting: Internal Medicine

## 2020-10-17 ENCOUNTER — Other Ambulatory Visit: Payer: Self-pay

## 2020-10-17 VITALS — BP 141/78 | HR 74 | Temp 95.6°F | Resp 17 | Ht 65.0 in | Wt 226.0 lb

## 2020-10-17 DIAGNOSIS — K5730 Diverticulosis of large intestine without perforation or abscess without bleeding: Secondary | ICD-10-CM | POA: Diagnosis not present

## 2020-10-17 DIAGNOSIS — B9681 Helicobacter pylori [H. pylori] as the cause of diseases classified elsewhere: Secondary | ICD-10-CM

## 2020-10-17 DIAGNOSIS — K2970 Gastritis, unspecified, without bleeding: Secondary | ICD-10-CM | POA: Diagnosis not present

## 2020-10-17 DIAGNOSIS — K625 Hemorrhage of anus and rectum: Secondary | ICD-10-CM

## 2020-10-17 DIAGNOSIS — K317 Polyp of stomach and duodenum: Secondary | ICD-10-CM | POA: Diagnosis not present

## 2020-10-17 DIAGNOSIS — K2950 Unspecified chronic gastritis without bleeding: Secondary | ICD-10-CM

## 2020-10-17 DIAGNOSIS — D509 Iron deficiency anemia, unspecified: Secondary | ICD-10-CM

## 2020-10-17 MED ORDER — SODIUM CHLORIDE 0.9 % IV SOLN: 500.0000 mL | Freq: Once | INTRAVENOUS | Status: DC

## 2020-10-17 NOTE — Op Note (Signed)
[facility] Patient Name: Brittany Evans Procedure Date: 10/17/2020 10:00 AM MRN: [id number] Endoscopist: Jerene Bears , MD Age: 64 Referring MD:  Date of Birth: Dec 23, 1955 Gender: Female Account #: [id number] Procedure:                Colonoscopy Indications:              Hematochezia, Iron deficiency anemia Medicines:                Monitored Anesthesia Care Procedure:                Pre-Anesthesia Assessment:                           - Prior to the procedure, a History and Physical                            was performed, and patient medications and                            allergies were reviewed. The patient's tolerance of                            previous anesthesia was also reviewed. The risks                            and benefits of the procedure and the sedation                            options and risks were discussed with the patient.                            All questions were answered, and informed consent                            was obtained. Prior Anticoagulants: The patient has                            taken no previous anticoagulant or antiplatelet                            agents. ASA Grade Assessment: III - A patient with                            severe systemic disease. After reviewing the risks                            and benefits, the patient was deemed in                            satisfactory condition to undergo the procedure.                           After obtaining informed consent, the colonoscope  was passed under direct vision. Throughout the                            procedure, the patient's blood pressure, pulse, and                            oxygen saturations were monitored continuously. The                            Olympus CF-HQ190L ([id number]) Colonoscope was                            introduced through the anus and advanced to the                            cecum, identified by  appendiceal orifice and                            ileocecal valve. The colonoscopy was performed                            without difficulty. The patient tolerated the                            procedure well. The quality of the bowel                            preparation was good. The ileocecal valve,                            appendiceal orifice, and rectum were photographed. Scope In: 11:48:02 AM Scope Out: 12:04:13 PM Scope Withdrawal Time: 0 hours 11 minutes 16 seconds  Total Procedure Duration: 0 hours 16 minutes 11 seconds  Findings:                 The digital rectal exam was normal.                           Multiple small and large-mouthed diverticula were                            found in the sigmoid colon, descending colon and                            transverse colon.                           The exam was otherwise without abnormality on                            direct and retroflexion views. Complications:            No immediate complications. Estimated Blood Loss:     Estimated blood loss: none. Impression:               - Diverticulosis in the sigmoid colon, in  the                            descending colon and in the transverse colon. This                            may explain 2 episodes of hematochezia, but not                            iron deficiency.                           - The examination was otherwise normal on direct                            and retroflexion views.                           - No specimens collected. Recommendation:           - Patient has a contact number available for                            emergencies. The signs and symptoms of potential                            delayed complications were discussed with the                            patient. Return to normal activities tomorrow.                            Written discharge instructions were provided to the                            patient.                            - Resume previous diet.                           - Continue present medications.                           - Repeat colonoscopy in 10 years for screening                            purposes.                           - See the other procedure note for documentation of                            additional recommendations. Jerene Bears, MD 10/17/2020 12:12:04 PM This report has been signed electronically.

## 2020-10-17 NOTE — Patient Instructions (Signed)
Handout on diverticulosis given.  Resume oral iron replacements.     YOU HAD AN ENDOSCOPIC PROCEDURE TODAY AT [facility]:   Refer to the procedure report that was given to you for any specific questions about what was found during the examination.  If the procedure report does not answer your questions, please call your gastroenterologist to clarify.  If you requested that your care partner not be given the details of your procedure findings, then the procedure report has been included in a sealed envelope for you to review at your convenience later.  YOU SHOULD EXPECT: Some feelings of bloating in the abdomen. Passage of more gas than usual.  Walking can help get rid of the air that was put into your GI tract during the procedure and reduce the bloating. If you had a lower endoscopy (such as a colonoscopy or flexible sigmoidoscopy) you may notice spotting of blood in your stool or on the toilet paper. If you underwent a bowel prep for your procedure, you may not have a normal bowel movement for a few days.  Please Note:  You might notice some irritation and congestion in your nose or some drainage.  This is from the oxygen used during your procedure.  There is no need for concern and it should clear up in a day or so.  SYMPTOMS TO REPORT IMMEDIATELY:   Following lower endoscopy (colonoscopy or flexible sigmoidoscopy):  Excessive amounts of blood in the stool  Significant tenderness or worsening of abdominal pains  Swelling of the abdomen that is new, acute  Fever of 100F or higher   Following upper endoscopy (EGD)  Vomiting of blood or coffee ground material  New chest pain or pain under the shoulder blades  Painful or persistently difficult swallowing  New shortness of breath  Fever of 100F or higher  Black, tarry-looking stools  For urgent or emergent issues, a gastroenterologist can be reached at any hour by calling [phone]. Do not use MyChart messaging for  urgent concerns.    DIET:  We do recommend a small meal at first, but then you may proceed to your regular diet.  Drink plenty of fluids but you should avoid alcoholic beverages for 24 hours.  ACTIVITY:  You should plan to take it easy for the rest of today and you should NOT DRIVE or use heavy machinery until tomorrow (because of the sedation medicines used during the test).    FOLLOW UP: Our staff will call the number listed on your records 48-72 hours following your procedure to check on you and address any questions or concerns that you may have regarding the information given to you following your procedure. If we do not reach you, we will leave a message.  We will attempt to reach you two times.  During this call, we will ask if you have developed any symptoms of COVID 19. If you develop any symptoms (ie: fever, flu-like symptoms, shortness of breath, cough etc.) before then, please call [phone].  If you test positive for Covid 19 in the 2 weeks post procedure, please call and report this information to [place].    If any biopsies were taken you will be contacted by phone or by letter within the next 1-3 weeks.  Please call us at [phone] if you have not heard about the biopsies in 3 weeks.    SIGNATURES/CONFIDENTIALITY: You and/or your care partner have signed paperwork which will be entered into your electronic medical record.  These signatures attest to the fact that that the information above on your After Visit Summary has been reviewed and is understood.  Full responsibility of the confidentiality of this discharge information lies with you and/or your care-partner.

## 2020-10-17 NOTE — Op Note (Signed)
[facility] Patient Name: Brittany Evans Procedure Date: 10/17/2020 10:00 AM MRN: [id number] Endoscopist: Jerene Bears , MD Age: 65 Referring MD:  Date of Birth: 1956-08-18 Gender: Female Account #: [id number] Procedure:                Upper GI endoscopy Indications:              Iron deficiency anemia Medicines:                Monitored Anesthesia Care Procedure:                Pre-Anesthesia Assessment:                           - Prior to the procedure, a History and Physical                            was performed, and patient medications and                            allergies were reviewed. The patient's tolerance of                            previous anesthesia was also reviewed. The risks                            and benefits of the procedure and the sedation                            options and risks were discussed with the patient.                            All questions were answered, and informed consent                            was obtained. Prior Anticoagulants: The patient has                            taken no previous anticoagulant or antiplatelet                            agents. ASA Grade Assessment: III - A patient with                            severe systemic disease. After reviewing the risks                            and benefits, the patient was deemed in                            satisfactory condition to undergo the procedure.                           After obtaining informed consent, the endoscope was  passed under direct vision. Throughout the                            procedure, the patient's blood pressure, pulse, and                            oxygen saturations were monitored continuously. The                            Endoscope was introduced through the mouth, and                            advanced to the second part of duodenum. The upper                            GI endoscopy was accomplished  without difficulty.                            The patient tolerated the procedure well. Scope In: Scope Out: Findings:                 The examined esophagus was normal.                           A single 6 mm sessile polyp with no bleeding was                            found on the greater curvature of the gastric                            antrum. The polyp was removed with a cold biopsy                            forceps. Resection and retrieval were complete.                           The exam of the stomach was otherwise normal.                           Biopsies were taken with a cold forceps in the                            gastric body, at the incisura and in the gastric                            antrum for histology and Helicobacter pylori                            testing.                           The examined duodenum was normal. Biopsies for  histology were taken with a cold forceps for                            evaluation of celiac disease. Complications:            No immediate complications. Estimated Blood Loss:     Estimated blood loss was minimal. Impression:               - Normal esophagus.                           - A single gastric polyp. Resected and retrieved.                           - Normal examined duodenum. Biopsied.                           - Biopsies were taken with a cold forceps for                            histology and Helicobacter pylori testing. Recommendation:           - Patient has a contact number available for                            emergencies. The signs and symptoms of potential                            delayed complications were discussed with the                            patient. Return to normal activities tomorrow.                            Written discharge instructions were provided to the                            patient.                           - Resume previous diet.                            - Continue present medications.                           - Await pathology results.                           - Resume oral iron replacement. Repeat CBC, IBC                            panel and ferritin in 6 weeks.                           - Video capsule endoscopy recommended to complete  evaluation of IDA.                           - If further hematochezia (blood in stools) please                            contact my office. Jerene Bears, MD 10/17/2020 12:23:59 PM This report has been signed electronically.

## 2020-10-17 NOTE — Progress Notes (Signed)
Called to room to assist during endoscopic procedure.  Patient ID and intended procedure confirmed with present staff. Received instructions for my participation in the procedure from the performing physician.  

## 2020-10-17 NOTE — Progress Notes (Signed)
To Pacu, vss. Report to Rn.tb ?

## 2020-10-17 NOTE — Progress Notes (Signed)
[facility]- [place]  Pt has chipped left front tooth. CRNA made aware.

## 2020-10-18 ENCOUNTER — Telehealth: Payer: Self-pay

## 2020-10-18 ENCOUNTER — Other Ambulatory Visit: Payer: Self-pay

## 2020-10-18 DIAGNOSIS — D509 Iron deficiency anemia, unspecified: Secondary | ICD-10-CM

## 2020-10-18 NOTE — Telephone Encounter (Signed)
Left message for pt to call back to schedule capsule endo. 

## 2020-10-18 NOTE — Telephone Encounter (Signed)
Pt scheduled for capsule endo 10/23/20@8 :30am. Paper filled out and placed in Amy Esterwood's office. Pt sent prep instructions via mychart and she is aware. Amb referral in epic.

## 2020-10-19 ENCOUNTER — Other Ambulatory Visit: Payer: Self-pay

## 2020-10-19 ENCOUNTER — Encounter (HOSPITAL_COMMUNITY): Payer: Self-pay

## 2020-10-19 ENCOUNTER — Ambulatory Visit (HOSPITAL_COMMUNITY)
Admission: EM | Admit: 2020-10-19 | Discharge: 2020-10-19 | Disposition: A | Discharge status: Home / Self Care | Payer: Medicare HMO

## 2020-10-19 ENCOUNTER — Telehealth: Payer: Self-pay | Admitting: *Deleted

## 2020-10-19 DIAGNOSIS — S93401A Sprain of unspecified ligament of right ankle, initial encounter: Secondary | ICD-10-CM

## 2020-10-19 NOTE — ED Triage Notes (Signed)
Pt in with c/o right ankle injury that occurred yesterday when she was in the shower and got light headed and fell  Pt wore ankle brace for relief from swelling  Denies any numbness or tingling

## 2020-10-19 NOTE — Telephone Encounter (Signed)
  Follow up Call-  Call back number 10/17/2020  Post procedure Call Back phone  # [phone]  Permission to leave phone message Yes  Some recent data might be hidden     Patient questions:  Do you have a fever, pain , or abdominal swelling? No. Pain Score  0 *  Have you tolerated food without any problems? Yes.    Have you been able to return to your normal activities? Yes.    Do you have any questions about your discharge instructions: Diet   No. Medications  No. Follow up visit  No.  Do you have questions or concerns about your Care? No.  Actions: * If pain score is 4 or above: No action needed, pain <4.  1. Have you developed a fever since your procedure? no  2.   Have you had an respiratory symptoms (SOB or cough) since your procedure? no  3.   Have you tested positive for COVID 19 since your procedure no  4.   Have you had any family members/close contacts diagnosed with the COVID 19 since your procedure? no   If yes to any of these questions please route to Joylene John, RN and Joella Prince, RN

## 2020-10-19 NOTE — ED Provider Notes (Signed)
[facility]    CSN: [id number] Arrival date & time: 10/19/20  1007      History   Chief Complaint Chief Complaint  Patient presents with  . Fall  . Ankle Pain    HPI Brittany Evans is a 65 y.o. female.   HPI   Ankle Pain: Pt reports that yesterday she got lightheaded while in the shower. She states that she managed to get out of the shower but fainted soon after. She estimates that she had LOC for only a few minutes to second as the water was still warm. She does not believe that she hit her head.  She believes that she may have fainted as she is newly anemic as of this week.  She is currently being worked up by an Materials engineer, hematologist, gastroenterologist and her primary care provider. She had a colonoscopy which she believes was normal yesterday. She is currently taking iron supplement and fluids and has been feeling better.  No recent blood loss.  She states that she twisted her right ankle trying to catch herself during the fall. Pain rated 4/10 in nature and only occurs when she walks. She has used an ankle brace with improvement. She reports no other injury during this encounter.  She is not having any palpitations, chest pain, shortness of breath, headache, visual changes, weakness. Of note she states that she has an appointment with her cardiologist today to discuss the syncope episodes.   Past Medical History:  Diagnosis Date  . Allergy   . Anemia    low iron- has appt to see MD  . Asthma    inhaler use as needed rarely  . CAD (coronary artery disease)    NSTEMI 2/12: LAD 30%, D1 40%, oCFX 70-80%, mRCA 10-20%, ?occl. of apical LAD; treated medically  . Cataract   . CHF (congestive heart failure) ([place])   . Chronic kidney disease    stage 3 chronic kidney disease- pt states she is asymptomatic  . Complication of anesthesia    Lightheaded for several days  . Diabetes mellitus   . DVT (deep venous thrombosis) (H[facility])    left leg x2  . Dysrhythmia   .  Hyperlipidemia   . Hypertension   . Myocardial infarction ([place])   . Neuromuscular disorder ([facility])    neuropathy due to DM  . NICM (nonischemic cardiomyopathy) ([place])    EF 20-25%, 45% 2012  . Obesity   . Peripheral vascular disease ([place])   . Systolic CHF [facility])     Patient Active Problem List   Diagnosis Date Noted  . Rectal bleeding 10/11/2020  . Fatigue 10/11/2020  . Plantar fasciitis of left foot 08/03/2014  . Fibroids 05/22/2014  . Postmenopausal bleeding 01/19/2014  . Diabetes ([place]) 01/02/2014  . HTN (hypertension) 01/02/2014  . Colon cancer screening 01/02/2014  . Type II or unspecified type diabetes mellitus with unspecified complication, uncontrolled 04/29/2013  . CKD (chronic kidney disease) stage 3, GFR 30-59 ml/min (HCC) 03/18/2013  . Dyslipidemia 03/11/2013  . SYSTOLIC HEART FAILURE, CHRONIC 10/21/2010  . CAD, NATIVE VESSEL 10/18/2010  . HYPERTENSION 01/18/2007    Past Surgical History:  Procedure Laterality Date  . COLONOSCOPY    . DILATATION & CURETTAGE/HYSTEROSCOPY WITH MYOSURE N/A 07/18/2014   Procedure: DILATATION & CURETTAGE/HYSTEROSCOPY WITH MYOSURE;  Surgeon: Woodroe Mode, MD;  Location: [facility];  Service: Gynecology;  Laterality: N/A;  . DILATATION & CURRETTAGE/HYSTEROSCOPY WITH RESECTOCOPE N/A 04/12/2015   Procedure:  DIAGNOSTIC HYSTEROSCOPY WITH RESECTION OF  FIBROID AND POLYPS;  Surgeon: Servando Salina, MD;  Location: [facility];  Service: Gynecology;  Laterality: N/A;  . DILATION AND CURETTAGE OF UTERUS N/A 04/12/2015   Procedure: DILATATION AND CURETTAGE;  Surgeon: Servando Salina, MD;  Location: [facility];  Service: Gynecology;  Laterality: N/A;  . None    . POLYPECTOMY N/A 07/18/2014   Procedure: Vaginal POLYPECTOMY;  Surgeon: Woodroe Mode, MD;  Location: [facility];  Service: Gynecology;  Laterality: N/A;  . WISDOM TOOTH EXTRACTION      OB History    Gravida  5   Para  2   Term  1   Preterm  1   AB  3   Living  2     SAB      IAB  3    Ectopic      Multiple      Live Births  2            Home Medications    Prior to Admission medications   Medication Sig Start Date End Date Taking? Authorizing Provider  ADVAIR HFA 115-21 MCG/ACT inhaler Inhale 2 puffs into the lungs 2 (two) times daily as needed (sob and wheezing). 07/20/20   [provider]  aspirin 81 MG tablet Take 1 tablet (81 mg total) by mouth daily. 04/29/13   Robbie Lis, MD  glipiZIDE (GLUCOTROL) 10 MG tablet Take 10 mg by mouth daily before breakfast.    [provider]  lisinopril (ZESTRIL) 10 MG tablet Take 10 mg by mouth daily.  05/05/19   [provider]  metFORMIN (GLUCOPHAGE-XR) 500 MG 24 hr tablet Take 500 mg by mouth daily. 09/20/20   [provider]  metoprolol tartrate (LOPRESSOR) 25 MG tablet TAKE 1 TABLET BY MOUTH TWICE DAILY 09/18/20   Tolia, Sunit, DO  montelukast (SINGULAIR) 10 MG tablet Take 1 tablet by mouth at bedtime. 01/14/19   [provider]  Multiple Vitamin (MULTI-VITAMINS) TABS TAKE 1 TABLET BY MOUTH ONCE DAILY 09/06/13   Robbie Lis, MD  nitroGLYCERIN (NITROSTAT) 0.4 MG SL tablet Place 1 tablet (0.4 mg total) under the tongue every 5 (five) minutes as needed. If you require more than two tablets five minutes apart call 911 and go to nearest ER via EMS. 12/14/19   Tolia, Sunit, DO  NYAMYC powder Apply 1 Units topically 2 (two) times daily. 01/14/19   [provider]  simvastatin (ZOCOR) 40 MG tablet Take 1 tablet (40 mg total) by mouth at bedtime. 01/04/14   Tresa Garter, MD  spironolactone (ALDACTONE) 50 MG tablet Take 50 mg by mouth daily.    [provider]  VENTOLIN HFA 108 (90 Base) MCG/ACT inhaler Inhale 1-2 puffs into the lungs every 6 (six) hours as needed for wheezing or shortness of breath. 06/19/20   [provider]    Family History Family History  Problem Relation Age of Onset  . Cancer Mother   . Diabetes Mother   . Coronary artery disease  Mother   . Heart disease Mother   . Rectal cancer Mother   . Cirrhosis Father        Alcohol abuse  . Alcohol abuse Father   . Heart disease Father   . Cancer Sister        lung  . Lung disease Sister   . Pancreatic cancer Maternal Grandmother   . Breast cancer Cousin   . Colon cancer Neg Hx   . Stomach cancer Neg Hx   .  Esophageal cancer Neg Hx     Social History Social History   Tobacco Use  . Smoking status: Never Smoker  . Smokeless tobacco: Never Used  Vaping Use  . Vaping Use: Never used  Substance Use Topics  . Alcohol use: No  . Drug use: No     Allergies   Trulicity [dulaglutide]   Review of Systems Review of Systems  As stated above in HPI  Physical Exam Triage Vital Signs ED Triage Vitals  Enc Vitals Group     BP 10/19/20 1029 131/72     Pulse Rate 10/19/20 1029 62     Resp 10/19/20 1029 17     Temp --      Temp src --      SpO2 10/19/20 1029 100 %     Weight --      Height --      Head Circumference --      Peak Flow --      Pain Score 10/19/20 1027 5     Pain Loc --      Pain Edu? --      Excl. in [place]? --    No data found.  Updated Vital Signs BP 131/72   Pulse 62   Resp 17   LMP  (LMP Unknown)   SpO2 100%   Physical Exam Vitals and nursing note reviewed.  Constitutional:      General: She is not in acute distress.    Appearance: Normal appearance. She is not ill-appearing, toxic-appearing or diaphoretic.  HENT:     Head: Normocephalic.     Mouth/Throat:     Mouth: Mucous membranes are moist.  Eyes:     Comments: Mild pallor  Neck:     Vascular: No carotid bruit.  Cardiovascular:     Rate and Rhythm: Normal rate and regular rhythm.     Pulses: Normal pulses.     Heart sounds: Normal heart sounds.     Comments: No peripheral edema Pulmonary:     Effort: Pulmonary effort is normal.     Breath sounds: Normal breath sounds.  Musculoskeletal:     Comments: She is able to ambulate well without limp. Slightly decreased ROM  of the right ankle due to pain. Mild edema of the lateral right ankle with mild ecchymosis. Mild tenderness to palpation of the lateral ankle. Negative fracture testing of the right leg, ankle and foot. No evidence of skin breakdown   Skin:    Capillary Refill: Capillary refill takes less than 2 seconds.  Neurological:     General: No focal deficit present.     Mental Status: She is alert and oriented to person, place, and time.     Motor: No weakness.     Gait: Gait normal.      UC Treatments / Results  Labs (all labs ordered are listed, but only abnormal results are displayed) Labs Reviewed - No data to display  EKG   Radiology No results found.  Procedures Procedures (including critical care time)  Medications Ordered in UC Medications - No data to display  Initial Impression / Assessment and Plan / UC Course  I have reviewed the triage vital signs and the nursing notes.  Pertinent labs & imaging results that were available during my care of the patient were reviewed by me and considered in my medical decision making (see chart for details).     New.  Patient appears stable but I agree that she should  have follow-up with her cardiologist.  Comforted to hear from patient that she had stable labs and EKG tracing yesterday during her colonoscopy.  I have encouraged her to stay hydrated with water, continue her iron supplementation and avoid events that may trigger syncope such as hot showers, standing quickly, excessive exercise.  We discussed that she likely has an ankle sprain.  I offered to obtain a x-ray image if requested by patient however she would like to hold off at this time which I think is reasonable.  She will continue to use an ankle brace which has offered relief already and we discussed RICE.  She needs to avoid NSAIDs given her history.  She can use Tylenol as needed.  We also discussed red flag signs and symptoms in terms of her general symptoms. Final Clinical  Impressions(s) / UC Diagnoses   Final diagnoses:  None   Discharge Instructions   None    ED Prescriptions    None     PDMP not reviewed this encounter.   Hughie Closs, [place] 10/19/20 1128

## 2020-10-22 ENCOUNTER — Telehealth: Payer: Self-pay | Admitting: Hematology and Oncology

## 2020-10-22 ENCOUNTER — Inpatient Hospital Stay: Payer: Medicare HMO | Attending: Hematology and Oncology | Admitting: Hematology and Oncology

## 2020-10-22 ENCOUNTER — Encounter: Payer: Self-pay | Admitting: Hematology and Oncology

## 2020-10-22 ENCOUNTER — Other Ambulatory Visit: Payer: Self-pay

## 2020-10-22 DIAGNOSIS — Z79899 Other long term (current) drug therapy: Secondary | ICD-10-CM | POA: Insufficient documentation

## 2020-10-22 DIAGNOSIS — Z803 Family history of malignant neoplasm of breast: Secondary | ICD-10-CM | POA: Insufficient documentation

## 2020-10-22 DIAGNOSIS — I252 Old myocardial infarction: Secondary | ICD-10-CM | POA: Diagnosis not present

## 2020-10-22 DIAGNOSIS — I130 Hypertensive heart and chronic kidney disease with heart failure and stage 1 through stage 4 chronic kidney disease, or unspecified chronic kidney disease: Secondary | ICD-10-CM | POA: Diagnosis not present

## 2020-10-22 DIAGNOSIS — N183 Chronic kidney disease, stage 3 unspecified: Secondary | ICD-10-CM | POA: Insufficient documentation

## 2020-10-22 DIAGNOSIS — Z833 Family history of diabetes mellitus: Secondary | ICD-10-CM | POA: Diagnosis not present

## 2020-10-22 DIAGNOSIS — E785 Hyperlipidemia, unspecified: Secondary | ICD-10-CM | POA: Diagnosis not present

## 2020-10-22 DIAGNOSIS — D500 Iron deficiency anemia secondary to blood loss (chronic): Secondary | ICD-10-CM | POA: Diagnosis present

## 2020-10-22 DIAGNOSIS — Z801 Family history of malignant neoplasm of trachea, bronchus and lung: Secondary | ICD-10-CM | POA: Insufficient documentation

## 2020-10-22 DIAGNOSIS — Z7982 Long term (current) use of aspirin: Secondary | ICD-10-CM | POA: Diagnosis not present

## 2020-10-22 DIAGNOSIS — Z8379 Family history of other diseases of the digestive system: Secondary | ICD-10-CM | POA: Insufficient documentation

## 2020-10-22 DIAGNOSIS — E1122 Type 2 diabetes mellitus with diabetic chronic kidney disease: Secondary | ICD-10-CM | POA: Diagnosis not present

## 2020-10-22 DIAGNOSIS — I2510 Atherosclerotic heart disease of native coronary artery without angina pectoris: Secondary | ICD-10-CM | POA: Insufficient documentation

## 2020-10-22 DIAGNOSIS — J45909 Unspecified asthma, uncomplicated: Secondary | ICD-10-CM | POA: Insufficient documentation

## 2020-10-22 DIAGNOSIS — I509 Heart failure, unspecified: Secondary | ICD-10-CM | POA: Insufficient documentation

## 2020-10-22 DIAGNOSIS — Z8249 Family history of ischemic heart disease and other diseases of the circulatory system: Secondary | ICD-10-CM | POA: Diagnosis not present

## 2020-10-22 DIAGNOSIS — Z86718 Personal history of other venous thrombosis and embolism: Secondary | ICD-10-CM | POA: Insufficient documentation

## 2020-10-22 DIAGNOSIS — D539 Nutritional anemia, unspecified: Secondary | ICD-10-CM | POA: Insufficient documentation

## 2020-10-22 DIAGNOSIS — Z7984 Long term (current) use of oral hypoglycemic drugs: Secondary | ICD-10-CM | POA: Insufficient documentation

## 2020-10-22 DIAGNOSIS — Z7951 Long term (current) use of inhaled steroids: Secondary | ICD-10-CM | POA: Diagnosis not present

## 2020-10-22 DIAGNOSIS — Z800 Family history of malignant neoplasm of digestive organs: Secondary | ICD-10-CM | POA: Insufficient documentation

## 2020-10-22 NOTE — Assessment & Plan Note (Signed)
She has history of coronary artery disease and history of CHF Due to her religious belief, she will not accept blood transfusion I am hopeful that the IV iron will help bring her blood count up quickly

## 2020-10-22 NOTE — Assessment & Plan Note (Signed)
I suspect there is a component of anemia chronic renal disease We will recheck her iron studies and kidney function in May

## 2020-10-22 NOTE — Telephone Encounter (Signed)
Scheduled appts per 3/7 sch msg. Gave pt a print out of AVS.

## 2020-10-22 NOTE — Progress Notes (Signed)
[facility] CONSULT NOTE  Patient Care Team: [name], FNP as PCP - General (Family Medicine) Romana Juniper, Dayton Bailiff, RD (Inactive) as Dietitian (Dietician)  CHIEF COMPLAINTS/PURPOSE OF CONSULTATION:  Severe iron deficiency anemia  HISTORY OF PRESENTING ILLNESS:  Brittany Evans 65 y.o. female is here because of severe iron deficiency anemia  She was found to have abnormal CBC from recent blood work I have the opportunity to review her CBC dated back to 12/09/2007 Her prior hemoglobin has been normal or as high as 15.4 noted on 03/11/2013 Starting on 08/07/2020, she was noted to be anemic with hemoglobin of 12.2.  She presented to the emergency room with rectal bleeding and was referred to GI service for evaluation Her most recent hemoglobin on 10/11/2020 showed hemoglobin of 8, MCV of 73.4  She denies recent chest pain on exertion but had recent shortness of breath on minimal exertion, several syncopal episodes leading to sprained ankle and palpitations. She had not noticed any recent bleeding such as epistaxis, hematuria or hemoptysis.  She had her last menstruation was 4 years ago.  She has no further postmenopausal bleeding after D&C.  Result of her D&C was normal  The patient denies over the counter NSAID ingestion. She is on antiplatelets agent with aspirin for secondary prevention. Her last colonoscopy was on 10/17/2020. Report showed  - Diverticulosis in the sigmoid colon, in the descending colon and in the transverse colon. This may explain 2 episodes of hematochezia, but not iron deficiency. - The examination was otherwise normal on direct and retroflexion views. - No specimens collected.  EGD on 10/17/2020 showed  - Normal esophagus. - A single gastric polyp. Resected and retrieved. - Normal examined duodenum. Biopsied. - Biopsies were taken with a cold forceps for histology and Helicobacter pylori testing.  She had no prior history or diagnosis of  cancer. Her age appropriate screening programs are up-to-date. She has pica with ice craving and eats a variety of diet. She never donated blood or received blood transfusion.  This is due to her religious belief being Jehovah witness The patient was prescribed oral iron supplements and she takes daily without benefit so far  MEDICAL HISTORY:  Past Medical History:  Diagnosis Date  . Allergy   . Anemia    low iron- has appt to see MD  . Asthma    inhaler use as needed rarely  . CAD (coronary artery disease)    NSTEMI 2/12: LAD 30%, D1 40%, oCFX 70-80%, mRCA 10-20%, ?occl. of apical LAD; treated medically  . Cataract   . CHF (congestive heart failure) ([place])   . Chronic kidney disease    stage 3 chronic kidney disease- pt states she is asymptomatic  . Complication of anesthesia    Lightheaded for several days  . Diabetes mellitus   . DVT (deep venous thrombosis) (HCC)    left leg x2  . Dysrhythmia   . Hyperlipidemia   . Hypertension   . Myocardial infarction ([place])   . Neuromuscular disorder (HCC)    neuropathy due to DM  . NICM (nonischemic cardiomyopathy) ([place])    EF 20-25%, 45% 2012  . Obesity   . Peripheral vascular disease ([place])   . Systolic CHF [facility])     SURGICAL HISTORY: Past Surgical History:  Procedure Laterality Date  . COLONOSCOPY    . DILATATION & CURETTAGE/HYSTEROSCOPY WITH MYOSURE N/A 07/18/2014   Procedure: DILATATION & CURETTAGE/HYSTEROSCOPY WITH MYOSURE;  Surgeon: Woodroe Mode, MD;  Location:  [facility];  Service: Gynecology;  Laterality: N/A;  . DILATATION & CURRETTAGE/HYSTEROSCOPY WITH RESECTOCOPE N/A 04/12/2015   Procedure:  DIAGNOSTIC HYSTEROSCOPY WITH RESECTION OF FIBROID AND POLYPS;  Surgeon: Servando Salina, MD;  Location: [facility];  Service: Gynecology;  Laterality: N/A;  . DILATION AND CURETTAGE OF UTERUS N/A 04/12/2015   Procedure: DILATATION AND CURETTAGE;  Surgeon: Servando Salina, MD;  Location: [facility];  Service: Gynecology;  Laterality: N/A;   . None    . POLYPECTOMY N/A 07/18/2014   Procedure: Vaginal POLYPECTOMY;  Surgeon: Woodroe Mode, MD;  Location: [facility];  Service: Gynecology;  Laterality: N/A;  . WISDOM TOOTH EXTRACTION      SOCIAL HISTORY: Social History   Socioeconomic History  . Marital status: Widowed    Spouse name: Not on file  . Number of children: 2  . Years of education: Not on file  . Highest education level: Not on file  Occupational History  . Occupation: disabled    Employer: [employer]    Comment: Part Time  Tobacco Use  . Smoking status: Never Smoker  . Smokeless tobacco: Never Used  Vaping Use  . Vaping Use: Never used  Substance and Sexual Activity  . Alcohol use: No  . Drug use: No  . Sexual activity: Not Currently    Birth control/protection: None, Post-menopausal  Other Topics Concern  . Not on file  Social History Narrative   Lives with daughter and brother      Social Determinants of Health   Financial Resource Strain: Not on file  Food Insecurity: Not on file  Transportation Needs: Not on file  Physical Activity: Not on file  Stress: Not on file  Social Connections: Not on file  Intimate Partner Violence: Not on file    FAMILY HISTORY: Family History  Problem Relation Age of Onset  . Cancer Mother   . Diabetes Mother   . Coronary artery disease Mother   . Heart disease Mother   . Rectal cancer Mother   . Cirrhosis Father        Alcohol abuse  . Alcohol abuse Father   . Heart disease Father   . Cancer Sister        lung  . Lung disease Sister   . Pancreatic cancer Maternal Grandmother   . Breast cancer Cousin   . Colon cancer Neg Hx   . Stomach cancer Neg Hx   . Esophageal cancer Neg Hx     ALLERGIES:  is allergic to trulicity [dulaglutide].  MEDICATIONS:  Current Outpatient Medications  Medication Sig Dispense Refill  . ADVAIR HFA 115-21 MCG/ACT inhaler Inhale 2 puffs into the lungs 2 (two) times daily as needed (sob and wheezing).    Marland Kitchen  aspirin 81 MG tablet Take 1 tablet (81 mg total) by mouth daily. 30 tablet 3  . glipiZIDE (GLUCOTROL) 10 MG tablet Take 10 mg by mouth daily before breakfast.    . lisinopril (ZESTRIL) 10 MG tablet Take 10 mg by mouth daily.     . metFORMIN (GLUCOPHAGE-XR) 500 MG 24 hr tablet Take 500 mg by mouth daily.    . metoprolol tartrate (LOPRESSOR) 25 MG tablet TAKE 1 TABLET BY MOUTH TWICE DAILY 60 tablet 10  . montelukast (SINGULAIR) 10 MG tablet Take 1 tablet by mouth at bedtime.    . Multiple Vitamin (MULTI-VITAMINS) TABS TAKE 1 TABLET BY MOUTH ONCE DAILY 30 tablet 3  . nitroGLYCERIN (NITROSTAT) 0.4 MG SL tablet Place 1 tablet (0.4 mg  total) under the tongue every 5 (five) minutes as needed. If you require more than two tablets five minutes apart call 911 and go to nearest ER via EMS. 10 tablet 0  . NYAMYC powder Apply 1 Units topically 2 (two) times daily.    . simvastatin (ZOCOR) 40 MG tablet Take 1 tablet (40 mg total) by mouth at bedtime. 90 tablet 3  . spironolactone (ALDACTONE) 50 MG tablet Take 50 mg by mouth daily.    . VENTOLIN HFA 108 (90 Base) MCG/ACT inhaler Inhale 1-2 puffs into the lungs every 6 (six) hours as needed for wheezing or shortness of breath.     No current facility-administered medications for this visit.    REVIEW OF SYSTEMS:   Constitutional: Denies fevers, chills or abnormal night sweats Eyes: Denies blurriness of vision, double vision or watery eyes Ears, nose, mouth, throat, and face: Denies mucositis or sore throat Cardiovascular: Denies palpitation, chest discomfort or lower extremity swelling Gastrointestinal:  Denies nausea, heartburn or change in bowel habits Skin: Denies abnormal skin rashes Lymphatics: Denies new lymphadenopathy or easy bruising Neurological:Denies numbness, tingling or new weaknesses Behavioral/Psych: Mood is stable, no new changes  All other systems were reviewed with the patient and are negative.  PHYSICAL EXAMINATION: ECOG PERFORMANCE  STATUS: 1 - Symptomatic but completely ambulatory  Vitals:   10/22/20 1416  BP: (!) 142/66  Pulse: 61  Resp: 18  Temp: 98.1 F (36.7 C)  SpO2: 100%   Filed Weights   10/22/20 1416  Weight: 225 lb 9.6 oz (102.3 kg)    GENERAL:alert, no distress and comfortable SKIN: skin color, texture, turgor are normal, no rashes or significant lesions EYES: She has pale conjunctiva OROPHARYNX:no exudate, no erythema and lips, buccal mucosa, and tongue normal  NECK: supple, thyroid normal size, non-tender, without nodularity LYMPH:  no palpable lymphadenopathy in the cervical, axillary or inguinal LUNGS: clear to auscultation and percussion with normal breathing effort HEART: regular rate & rhythm and no murmurs and no lower extremity edema ABDOMEN:abdomen soft, non-tender and normal bowel sounds Musculoskeletal:no cyanosis of digits and no clubbing  PSYCH: alert & oriented x 3 with fluent speech NEURO: no focal motor/sensory deficits  ASSESSMENT & PLAN:  Iron deficiency anemia due to chronic blood loss The most likely cause of her anemia is due to chronic blood loss/malabsorption syndrome. We discussed some of the risks, benefits, and alternatives of intravenous iron infusions. The patient is symptomatic from anemia and the iron level is critically low. She tolerated oral iron supplement poorly and desires to achieved higher levels of iron faster for adequate hematopoesis. Some of the side-effects to be expected including risks of infusion reactions, phlebitis, headaches, nausea and fatigue.  The patient is willing to proceed. Patient education material was dispensed.  Goal is to keep ferritin level greater than 50 and resolution of anemia I recommend 3 doses of IV iron followed by repeat blood work and iron studies in May She is in agreement to proceed   CKD (chronic kidney disease) stage 3, GFR 30-59 ml/min (HCC) I suspect there is a component of anemia chronic renal disease We will  recheck her iron studies and kidney function in May  CAD, NATIVE VESSEL She has history of coronary artery disease and history of CHF Due to her religious belief, she will not accept blood transfusion I am hopeful that the IV iron will help bring her blood count up quickly  Orders Placed This Encounter  Procedures  . Iron and TIBC  Standing Status:   Standing    Number of Occurrences:   3    Standing Expiration Date:   10/22/2021  . Ferritin    Standing Status:   Standing    Number of Occurrences:   3    Standing Expiration Date:   10/22/2021  . CBC with Differential/Platelet    Standing Status:   Standing    Number of Occurrences:   22    Standing Expiration Date:   10/22/2021  . Comprehensive metabolic panel    Standing Status:   Standing    Number of Occurrences:   33    Standing Expiration Date:   10/22/2021  . Vitamin B12    Standing Status:   Standing    Number of Occurrences:   1    Standing Expiration Date:   10/22/2021    All questions were answered. The patient knows to call the clinic with any problems, questions or concerns.  The total time spent in the appointment was 55 minutes encounter with patients including review of chart and various tests results, discussions about plan of care and coordination of care plan  [name], MD 3/7/20222:53 PM       [name], MD 10/22/20 2:53 PM

## 2020-10-22 NOTE — Assessment & Plan Note (Signed)
The most likely cause of her anemia is due to chronic blood loss/malabsorption syndrome. We discussed some of the risks, benefits, and alternatives of intravenous iron infusions. The patient is symptomatic from anemia and the iron level is critically low. She tolerated oral iron supplement poorly and desires to achieved higher levels of iron faster for adequate hematopoesis. Some of the side-effects to be expected including risks of infusion reactions, phlebitis, headaches, nausea and fatigue.  The patient is willing to proceed. Patient education material was dispensed.  Goal is to keep ferritin level greater than 50 and resolution of anemia I recommend 3 doses of IV iron followed by repeat blood work and iron studies in May She is in agreement to proceed

## 2020-10-25 ENCOUNTER — Other Ambulatory Visit: Payer: Self-pay

## 2020-10-25 ENCOUNTER — Encounter: Payer: Self-pay | Admitting: Internal Medicine

## 2020-10-25 DIAGNOSIS — A048 Other specified bacterial intestinal infections: Secondary | ICD-10-CM

## 2020-10-25 MED ORDER — BIS SUBCIT-METRONID-TETRACYC 140-125-125 MG PO CAPS: 3.0000 | ORAL_CAPSULE | Freq: Three times a day (TID) | ORAL | 0 refills | Status: DC

## 2020-10-25 MED ORDER — OMEPRAZOLE 20 MG PO CPDR: 20.0000 mg | DELAYED_RELEASE_CAPSULE | Freq: Two times a day (BID) | ORAL | 0 refills | Status: DC

## 2020-10-30 ENCOUNTER — Encounter: Payer: Self-pay | Admitting: Internal Medicine

## 2020-10-30 ENCOUNTER — Ambulatory Visit (INDEPENDENT_AMBULATORY_CARE_PROVIDER_SITE_OTHER): Payer: Medicare HMO | Admitting: Internal Medicine

## 2020-10-30 DIAGNOSIS — D500 Iron deficiency anemia secondary to blood loss (chronic): Secondary | ICD-10-CM | POA: Diagnosis not present

## 2020-10-30 NOTE — Progress Notes (Signed)
Patient here for capsule endoscopy. She verbalized understanding of all written and verbal instructions.  She will return today at 4:00.  Lot number [id number]  Exp 02/26/2022

## 2020-10-31 ENCOUNTER — Other Ambulatory Visit: Payer: Self-pay

## 2020-10-31 ENCOUNTER — Encounter (HOSPITAL_COMMUNITY): Payer: Self-pay

## 2020-10-31 ENCOUNTER — Ambulatory Visit (INDEPENDENT_AMBULATORY_CARE_PROVIDER_SITE_OTHER): Payer: Medicare HMO

## 2020-10-31 ENCOUNTER — Ambulatory Visit (HOSPITAL_COMMUNITY)
Admission: EM | Admit: 2020-10-31 | Discharge: 2020-10-31 | Disposition: A | Discharge status: Home / Self Care | Payer: Medicare HMO | Attending: Emergency Medicine | Admitting: Emergency Medicine

## 2020-10-31 DIAGNOSIS — S82831A Other fracture of upper and lower end of right fibula, initial encounter for closed fracture: Secondary | ICD-10-CM

## 2020-10-31 DIAGNOSIS — M25571 Pain in right ankle and joints of right foot: Secondary | ICD-10-CM

## 2020-10-31 DIAGNOSIS — W19XXXA Unspecified fall, initial encounter: Secondary | ICD-10-CM | POA: Diagnosis not present

## 2020-10-31 MED ORDER — HYDROCODONE-ACETAMINOPHEN 5-325 MG PO TABS: 1.0000 | ORAL_TABLET | Freq: Four times a day (QID) | ORAL | 0 refills | Status: DC | PRN

## 2020-10-31 NOTE — Discharge Instructions (Signed)
Take Tylenol as needed for mild to moderate pain.  Take the prescribed hydrocodone-acetaminophen as needed for moderate to severe pain; do not drive, operate machinery, or drink alcohol with this medication as it will cause drowsiness.    Rest and elevate your ankle.  Apply ice packs 2-3 times a day for up to 20 minutes each.  Wear the walking boot and use the crutches.  Follow up with an orthopedist as soon as possible.

## 2020-10-31 NOTE — ED Provider Notes (Signed)
[place]    CSN: [id number] Arrival date & time: 10/31/20  1249      History   Chief Complaint Chief Complaint  Patient presents with  . Leg Pain    HPI Brittany Evans is a 65 y.o. female.   Patient presents with ongoing right ankle pain and swelling x1 week.  The pain started after a fall.  She was seen here on 10/19/2020; diagnosed with right ankle sprain; Per medical record, patient declined x-ray at that time; treated with ankle brace, RICE, Tylenol.  She states the pain and swelling have just not gotten better.  She denies numbness, weakness, paresthesias, open wounds, redness, bruising, or other symptoms.  She continues taking Tylenol at home for the pain.  Her medical history includes hypertension, CHF, CAD, MI, cardiomyopathy, obesity, asthma, diabetes, diabetic neuropathy, anemia.  The history is provided by the patient and medical records.    Past Medical History:  Diagnosis Date  . Allergy   . Anemia    low iron- has appt to see MD  . Asthma    inhaler use as needed rarely  . CAD (coronary artery disease)    NSTEMI 2/12: LAD 30%, D1 40%, oCFX 70-80%, mRCA 10-20%, ?occl. of apical LAD; treated medically  . Cataract   . CHF (congestive heart failure) ([place])   . Chronic kidney disease    stage 3 chronic kidney disease- pt states she is asymptomatic  . Complication of anesthesia    Lightheaded for several days  . Diabetes mellitus   . DVT (deep venous thrombosis) (HCC)    left leg x2  . Dysrhythmia   . Hyperlipidemia   . Hypertension   . Myocardial infarction ([place])   . Neuromuscular disorder (HCC)    neuropathy due to DM  . NICM (nonischemic cardiomyopathy) ([place])    EF 20-25%, 45% 2012  . Obesity   . Peripheral vascular disease ([place])   . Systolic CHF [facility])     Patient Active Problem List   Diagnosis Date Noted  . Iron deficiency anemia due to chronic blood loss 10/22/2020  . Deficiency anemia 10/22/2020  . Rectal bleeding 10/11/2020  .  Fatigue 10/11/2020  . Plantar fasciitis of left foot 08/03/2014  . Fibroids 05/22/2014  . Postmenopausal bleeding 01/19/2014  . Diabetes ([place]) 01/02/2014  . HTN (hypertension) 01/02/2014  . Colon cancer screening 01/02/2014  . Type II or unspecified type diabetes mellitus with unspecified complication, uncontrolled 04/29/2013  . CKD (chronic kidney disease) stage 3, GFR 30-59 ml/min (HCC) 03/18/2013  . Dyslipidemia 03/11/2013  . SYSTOLIC HEART FAILURE, CHRONIC 10/21/2010  . CAD, NATIVE VESSEL 10/18/2010  . HYPERTENSION 01/18/2007    Past Surgical History:  Procedure Laterality Date  . COLONOSCOPY    . DILATATION & CURETTAGE/HYSTEROSCOPY WITH MYOSURE N/A 07/18/2014   Procedure: DILATATION & CURETTAGE/HYSTEROSCOPY WITH MYOSURE;  Surgeon: Woodroe Mode, MD;  Location: [place];  Service: Gynecology;  Laterality: N/A;  . DILATATION & CURRETTAGE/HYSTEROSCOPY WITH RESECTOCOPE N/A 04/12/2015   Procedure:  DIAGNOSTIC HYSTEROSCOPY WITH RESECTION OF FIBROID AND POLYPS;  Surgeon: Servando Salina, MD;  Location: [place] [facility];  Service: Gynecology;  Laterality: N/A;  . DILATION AND CURETTAGE OF UTERUS N/A 04/12/2015   Procedure: DILATATION AND CURETTAGE;  Surgeon: Servando Salina, MD;  Location: [place];  Service: Gynecology;  Laterality: N/A;  . None    . POLYPECTOMY N/A 07/18/2014   Procedure: Vaginal POLYPECTOMY;  Surgeon: Woodroe Mode, MD;  Location: [facility];  Service: Gynecology;  Laterality: N/A;  . WISDOM TOOTH EXTRACTION      OB History    Gravida  5   Para  2   Term  1   Preterm  1   AB  3   Living  2     SAB      IAB  3   Ectopic      Multiple      Live Births  2            Home Medications    Prior to Admission medications   Medication Sig Start Date End Date Taking? Authorizing Provider  HYDROcodone-acetaminophen (NORCO/VICODIN) 5-325 MG tablet Take 1-2 tablets by mouth every 6 (six) hours as needed. 10/31/20  Yes Sharion Balloon, NP  ADVAIR [facility] 115-21 MCG/ACT  inhaler Inhale 2 puffs into the lungs 2 (two) times daily as needed (sob and wheezing). 07/20/20   [provider]  aspirin 81 MG tablet Take 1 tablet (81 mg total) by mouth daily. 04/29/13   Robbie Lis, MD  bismuth-metronidazole-tetracycline [facility]) [phone] MG capsule Take 3 capsules by mouth 4 (four) times daily -  before meals and at bedtime. 10/25/20   Pyrtle, Lajuan Lines, MD  glipiZIDE (GLUCOTROL) 10 MG tablet Take 10 mg by mouth daily before breakfast.    [provider]  lisinopril (ZESTRIL) 10 MG tablet Take 10 mg by mouth daily.  05/05/19   [provider]  metFORMIN (GLUCOPHAGE-XR) 500 MG 24 hr tablet Take 500 mg by mouth daily. 09/20/20   [provider]  metoprolol tartrate (LOPRESSOR) 25 MG tablet TAKE 1 TABLET BY MOUTH TWICE DAILY 09/18/20   Tolia, Sunit, DO  montelukast (SINGULAIR) 10 MG tablet Take 1 tablet by mouth at bedtime. 01/14/19   [provider]  Multiple Vitamin (MULTI-VITAMINS) TABS TAKE 1 TABLET BY MOUTH ONCE DAILY 09/06/13   Robbie Lis, MD  nitroGLYCERIN (NITROSTAT) 0.4 MG SL tablet Place 1 tablet (0.4 mg total) under the tongue every 5 (five) minutes as needed. If you require more than two tablets five minutes apart call 911 and go to nearest ER via EMS. 12/14/19   Tolia, Sunit, DO  NYAMYC powder Apply 1 Units topically 2 (two) times daily. 01/14/19   [provider]  omeprazole (PRILOSEC) 20 MG capsule Take 1 capsule (20 mg total) by mouth 2 (two) times daily before a meal. 10/25/20   Pyrtle, Lajuan Lines, MD  simvastatin (ZOCOR) 40 MG tablet Take 1 tablet (40 mg total) by mouth at bedtime. 01/04/14   Tresa Garter, MD  spironolactone (ALDACTONE) 50 MG tablet Take 50 mg by mouth daily.    [provider]  VENTOLIN HFA 108 (90 Base) MCG/ACT inhaler Inhale 1-2 puffs into the lungs every 6 (six) hours as needed for wheezing or shortness of breath. 06/19/20   [provider]    Family History Family History   Problem Relation Age of Onset  . Cancer Mother   . Diabetes Mother   . Coronary artery disease Mother   . Heart disease Mother   . Rectal cancer Mother   . Cirrhosis Father        Alcohol abuse  . Alcohol abuse Father   . Heart disease Father   . Cancer Sister        lung  . Lung disease Sister   . Pancreatic cancer Maternal Grandmother   . Breast cancer Cousin   . Colon cancer Neg Hx   . Stomach  cancer Neg Hx   . Esophageal cancer Neg Hx     Social History Social History   Tobacco Use  . Smoking status: Never Smoker  . Smokeless tobacco: Never Used  Vaping Use  . Vaping Use: Never used  Substance Use Topics  . Alcohol use: No  . Drug use: No     Allergies   Trulicity [dulaglutide]   Review of Systems Review of Systems  Constitutional: Negative for chills and fever.  HENT: Negative for ear pain and sore throat.   Eyes: Negative for pain and visual disturbance.  Respiratory: Negative for cough and shortness of breath.   Cardiovascular: Negative for chest pain and palpitations.  Gastrointestinal: Negative for abdominal pain and vomiting.  Genitourinary: Negative for dysuria and hematuria.  Musculoskeletal: Positive for arthralgias, gait problem and joint swelling. Negative for back pain.  Skin: Negative for color change, rash and wound.  Neurological: Negative for weakness and numbness.  All other systems reviewed and are negative.    Physical Exam Triage Vital Signs ED Triage Vitals  Enc Vitals Group     BP 10/31/20 1355 131/69     Pulse Rate 10/31/20 1353 74     Resp 10/31/20 1353 19     Temp 10/31/20 1353 98.6 F (37 C)     Temp src --      SpO2 10/31/20 1353 100 %     Weight --      Height --      Head Circumference --      Peak Flow --      Pain Score 10/31/20 1350 8     Pain Loc --      Pain Edu? --      Excl. in [place]? --    No data found.  Updated Vital Signs BP 131/69   Pulse 74   Temp 98.6 F (37 C)   Resp 19   LMP  (LMP  Unknown)   SpO2 100%   Visual Acuity Right Eye Distance:   Left Eye Distance:   Bilateral Distance:    Right Eye Near:   Left Eye Near:    Bilateral Near:     Physical Exam Vitals and nursing note reviewed.  Constitutional:      General: She is not in acute distress.    Appearance: She is well-developed.  HENT:     Head: Normocephalic and atraumatic.     Mouth/Throat:     Mouth: Mucous membranes are moist.  Eyes:     Conjunctiva/sclera: Conjunctivae normal.  Cardiovascular:     Rate and Rhythm: Normal rate and regular rhythm.     Heart sounds: Normal heart sounds.  Pulmonary:     Effort: Pulmonary effort is normal. No respiratory distress.     Breath sounds: Normal breath sounds.  Abdominal:     Palpations: Abdomen is soft.     Tenderness: There is no abdominal tenderness.  Musculoskeletal:        General: Swelling and tenderness present. No deformity. Normal range of motion.     Cervical back: Neck supple.       Feet:  Skin:    General: Skin is warm and dry.     Findings: No bruising, erythema, lesion or rash.  Neurological:     General: No focal deficit present.     Mental Status: She is alert and oriented to person, place, and time.     Sensory: No sensory deficit.     Motor: No  weakness.     Gait: Gait abnormal.  Psychiatric:        Mood and Affect: Mood normal.        Behavior: Behavior normal.      UC Treatments / Results  Labs (all labs ordered are listed, but only abnormal results are displayed) Labs Reviewed - No data to display  EKG   Radiology DG Ankle Complete Right  Result Date: 10/31/2020 CLINICAL DATA:  Pain and swelling post fall on 10/23/2020, lateral ankle pain EXAM: RIGHT ANKLE - COMPLETE 3+ VIEW COMPARISON:  None FINDINGS: Soft tissue swelling, greatest laterally. Osseous mineralization normal. Joint spaces preserved. Small spurs at the malleoli. Nondisplaced oblique distal RIGHT fibular fracture. No additional fracture, dislocation,  or bone destruction. Tiny rounded calcific density is seen adjacent to the tip of the medial malleolus, appears corticated/old. Prominent calcaneal spurs at plantar aspect and at Achilles insertion. IMPRESSION: Nondisplaced oblique distal RIGHT fibular fracture. Electronically Signed   By: Lavonia Dana M.D.   On: 10/31/2020 15:32    Procedures Procedures (including critical care time)  Medications Ordered in UC Medications - No data to display  Initial Impression / Assessment and Plan / UC Course  I have reviewed the triage vital signs and the nursing notes.  Pertinent labs & imaging results that were available during my care of the patient were reviewed by me and considered in my medical decision making (see chart for details).   Closed fracture of the distal right fibula.  X-ray shows nondisplaced oblique distal right fibular fracture.  Treating with Cam boot and crutches.  Also ibuprofen, rest, elevation, ice packs.  Instructed patient to follow-up with orthopedics as soon as possible; contact information provided for [facility] as they are on call today.  She agrees to plan of care.   Final Clinical Impressions(s) / UC Diagnoses   Final diagnoses:  Other closed fracture of distal end of right fibula, initial encounter     Discharge Instructions     Take Tylenol as needed for mild to moderate pain.  Take the prescribed hydrocodone-acetaminophen as needed for moderate to severe pain; do not drive, operate machinery, or drink alcohol with this medication as it will cause drowsiness.    Rest and elevate your ankle.  Apply ice packs 2-3 times a day for up to 20 minutes each.  Wear the walking boot and use the crutches.  Follow up with an orthopedist as soon as possible.         ED Prescriptions    Medication Sig Dispense Auth. Provider   HYDROcodone-acetaminophen (NORCO/VICODIN) 5-325 MG tablet Take 1-2 tablets by mouth every 6 (six) hours as needed. 6 tablet Sharion Balloon, NP     I have reviewed the PDMP during this encounter.   Sharion Balloon, NP 10/31/20 1558

## 2020-10-31 NOTE — ED Triage Notes (Addendum)
Pt in with c/o right ankle pain that has been going on for over 1 week. States she was seen approx 1 week ago but the pain is getting worse and she would like an x-ray   Pt has been taking tylenol with no relief

## 2020-11-02 ENCOUNTER — Inpatient Hospital Stay: Payer: Medicare HMO

## 2020-11-02 ENCOUNTER — Other Ambulatory Visit: Payer: Self-pay

## 2020-11-02 VITALS — BP 147/73 | HR 54 | Temp 98.2°F | Resp 17

## 2020-11-02 DIAGNOSIS — D500 Iron deficiency anemia secondary to blood loss (chronic): Secondary | ICD-10-CM | POA: Diagnosis not present

## 2020-11-02 DIAGNOSIS — K625 Hemorrhage of anus and rectum: Secondary | ICD-10-CM

## 2020-11-02 MED ORDER — SODIUM CHLORIDE 0.9 % IV SOLN
Freq: Once | INTRAVENOUS | Status: AC
  Filled 2020-11-02: qty 250

## 2020-11-02 MED ORDER — SODIUM CHLORIDE 0.9 % IV SOLN
300.0000 mg | Freq: Once | INTRAVENOUS | Status: AC
  Administered 2020-11-02: 300 mg via INTRAVENOUS
  Filled 2020-11-02: qty 10

## 2020-11-02 NOTE — Patient Instructions (Addendum)
Iron Sucrose injection What is this medicine? IRON SUCROSE (AHY ern SOO krohs) is an iron complex. Iron is used to make healthy red blood cells, which carry oxygen and nutrients throughout the body. This medicine is used to treat iron deficiency anemia in people with chronic kidney disease. This medicine may be used for other purposes; ask your health care provider or pharmacist if you have questions. COMMON BRAND NAME(S): Venofer What should I tell my health care provider before I take this medicine? They need to know if you have any of these conditions:  anemia not caused by low iron levels  heart disease  high levels of iron in the blood  kidney disease  liver disease  an unusual or allergic reaction to iron, other medicines, foods, dyes, or preservatives  pregnant or trying to get pregnant  breast-feeding How should I use this medicine? This medicine is for infusion into a vein. It is given by a health care professional in a hospital or clinic setting. Talk to your pediatrician regarding the use of this medicine in children. While this drug may be prescribed for children as young as 2 years for selected conditions, precautions do apply. Overdosage: If you think you have taken too much of this medicine contact a poison control center or emergency room at once. NOTE: This medicine is only for you. Do not share this medicine with others. What if I miss a dose? It is important not to miss your dose. Call your doctor or health care professional if you are unable to keep an appointment. What may interact with this medicine? Do not take this medicine with any of the following medications:  deferoxamine  dimercaprol  other iron products This medicine may also interact with the following medications:  chloramphenicol  deferasirox This list may not describe all possible interactions. Give your health care provider a list of all the medicines, herbs, non-prescription drugs, or  dietary supplements you use. Also tell them if you smoke, drink alcohol, or use illegal drugs. Some items may interact with your medicine. What should I watch for while using this medicine? Visit your doctor or healthcare professional regularly. Tell your doctor or healthcare professional if your symptoms do not start to get better or if they get worse. You may need blood work done while you are taking this medicine. You may need to follow a special diet. Talk to your doctor. Foods that contain iron include: whole grains/cereals, dried fruits, beans, or peas, leafy green vegetables, and organ meats (liver, kidney). What side effects may I notice from receiving this medicine? Side effects that you should report to your doctor or health care professional as soon as possible:  allergic reactions like skin rash, itching or hives, swelling of the face, lips, or tongue  breathing problems  changes in blood pressure  cough  fast, irregular heartbeat  feeling faint or lightheaded, falls  fever or chills  flushing, sweating, or hot feelings  joint or muscle aches/pains  seizures  swelling of the ankles or feet  unusually weak or tired Side effects that usually do not require medical attention (report to your doctor or health care professional if they continue or are bothersome):  diarrhea  feeling achy  headache  irritation at site where injected  nausea, vomiting  stomach upset  tiredness This list may not describe all possible side effects. Call your doctor for medical advice about side effects. You may report side effects to FDA at 1-800-FDA-1088. Where should I keep   my medicine? This drug is given in a hospital or clinic and will not be stored at home. NOTE: This sheet is a summary. It may not cover all possible information. If you have questions about this medicine, talk to your doctor, pharmacist, or health care provider.  2021 Elsevier/Gold Standard (2011-05-15  17:14:35)   Iron-Rich Diet  Iron is a mineral that helps your body to produce hemoglobin. Hemoglobin is a protein in red blood cells that carries oxygen to your body's tissues. Eating too little iron may cause you to feel weak and tired, and it can increase your risk of infection. Iron is naturally found in many foods, and many foods have iron added to them (iron-fortified foods). You may need to follow an iron-rich diet if you do not have enough iron in your body due to certain medical conditions. The amount of iron that you need each day depends on your age, your sex, and any medical conditions you have. Follow instructions from your health care provider or a diet and nutrition specialist (dietitian) about how much iron you should eat each day. What are tips for following this plan? Reading food labels  Check food labels to see how many milligrams (mg) of iron are in each serving. Cooking  Cook foods in pots and pans that are made from iron.  Take these steps to make it easier for your body to absorb iron from certain foods: ? Soak beans overnight before cooking. ? Soak whole grains overnight and drain them before using. ? Ferment flours before baking, such as by using yeast in bread dough. Meal planning  When you eat foods that contain iron, you should eat them with foods that are high in vitamin C. These include oranges, peppers, tomatoes, potatoes, and mango. Vitamin C helps your body to absorb iron. General information  Take iron supplements only as told by your health care provider. An overdose of iron can be life-threatening. If you were prescribed iron supplements, take them with orange juice or a vitamin C supplement.  When you eat iron-fortified foods or take an iron supplement, you should also eat foods that naturally contain iron, such as meat, poultry, and fish. Eating naturally iron-rich foods helps your body to absorb the iron that is added to other foods or contained in a  supplement.  Certain foods and drinks prevent your body from absorbing iron properly. Avoid eating these foods in the same meal as iron-rich foods or with iron supplements. These foods include: ? Coffee, black tea, and red wine. ? Milk, dairy products, and foods that are high in calcium. ? Beans and soybeans. ? Whole grains. What foods should I eat? Fruits Prunes. Raisins. Eat fruits high in vitamin C, such as oranges, grapefruits, and strawberries, alongside iron-rich foods. Vegetables Spinach (cooked). Green peas. Broccoli. Fermented vegetables. Eat vegetables high in vitamin C, such as leafy greens, potatoes, bell peppers, and tomatoes, alongside iron-rich foods. Grains Iron-fortified breakfast cereal. Iron-fortified whole-wheat bread. Enriched rice. Sprouted grains. Meats and other proteins Beef liver. Oysters. Beef. Shrimp. [place]. Chicken. [place]. Sardines. Chickpeas. Nuts. Tofu. Pumpkin seeds. Beverages Tomato juice. Fresh orange juice. Prune juice. Hibiscus tea. Fortified instant breakfast shakes. Sweets and desserts Blackstrap molasses. Seasonings and condiments Tahini. Fermented soy sauce. Other foods Wheat germ. The items listed above may not be a complete list of recommended foods and beverages. Contact a dietitian for more information. What foods should I avoid? Grains Whole grains. Bran cereal. Bran flour. Oats. Meats and other proteins Soybeans.  Products made from soy protein. Black beans. Lentils. Mung beans. Split peas. Dairy Milk. Cream. Cheese. Yogurt. Cottage cheese. Beverages Coffee. Black tea. Red wine. Sweets and desserts Cocoa. Chocolate. Ice cream. Other foods Basil. Oregano. Large amounts of parsley. The items listed above may not be a complete list of foods and beverages to avoid. Contact a dietitian for more information. Summary  Iron is a mineral that helps your body to produce hemoglobin. Hemoglobin is a protein in red blood cells that carries  oxygen to your body's tissues.  Iron is naturally found in many foods, and many foods have iron added to them (iron-fortified foods).  When you eat foods that contain iron, you should eat them with foods that are high in vitamin C. Vitamin C helps your body to absorb iron.  Certain foods and drinks prevent your body from absorbing iron properly, such as whole grains and dairy products. You should avoid eating these foods in the same meal as iron-rich foods or with iron supplements. This information is not intended to replace advice given to you by your health care provider. Make sure you discuss any questions you have with your health care provider. Document Revised: 07/17/2017 Document Reviewed: 06/30/2017 Elsevier Patient Education  2021 [employer].

## 2020-11-03 ENCOUNTER — Other Ambulatory Visit: Payer: Self-pay | Admitting: Oncology

## 2020-11-03 NOTE — Progress Notes (Signed)
Called by [facility] wpt's c/o back pain. She had 300 mg venofer yesterday.  I do not believe the pain is going to be related to the iron infusion. Pt also had a recent fall and tibial fracture.The pain is more likely to be musculoskeletal.  I suggested she try aleve+tylenol and call back if the pain does not improve in 2-3 hours.

## 2020-11-09 ENCOUNTER — Inpatient Hospital Stay: Payer: Medicare HMO

## 2020-11-09 ENCOUNTER — Other Ambulatory Visit: Payer: Self-pay

## 2020-11-09 VITALS — BP 134/64 | HR 51 | Temp 98.3°F | Resp 18

## 2020-11-09 DIAGNOSIS — D500 Iron deficiency anemia secondary to blood loss (chronic): Secondary | ICD-10-CM | POA: Diagnosis not present

## 2020-11-09 DIAGNOSIS — K625 Hemorrhage of anus and rectum: Secondary | ICD-10-CM

## 2020-11-09 MED ORDER — SODIUM CHLORIDE 0.9 % IV SOLN
Freq: Once | INTRAVENOUS | Status: AC
  Filled 2020-11-09: qty 250

## 2020-11-09 MED ORDER — SODIUM CHLORIDE 0.9 % IV SOLN
300.0000 mg | Freq: Once | INTRAVENOUS | Status: AC
  Administered 2020-11-09: 300 mg via INTRAVENOUS
  Filled 2020-11-09: qty 10

## 2020-11-09 NOTE — Patient Instructions (Signed)

## 2020-11-12 ENCOUNTER — Telehealth: Payer: Self-pay

## 2020-11-12 NOTE — Telephone Encounter (Signed)
Left message for pt to call back.  Capsule results per Dr. Hilarie Fredrickson:  Complete capsule study, negative except for one small erosion. No findings to explain IDA. Pt does have CKD which could be contributing to anemia. Continue to follow-up with hematology and iron infusions as indicated.

## 2020-11-13 NOTE — Telephone Encounter (Signed)
Spoke with pt and she is aware.

## 2020-11-13 NOTE — Telephone Encounter (Signed)
Patient is returning your call.  

## 2020-11-16 ENCOUNTER — Inpatient Hospital Stay: Payer: Medicare HMO | Attending: Hematology and Oncology

## 2020-11-16 ENCOUNTER — Other Ambulatory Visit: Payer: Self-pay

## 2020-11-16 VITALS — BP 151/61 | HR 60 | Temp 98.0°F | Resp 16

## 2020-11-16 DIAGNOSIS — D508 Other iron deficiency anemias: Secondary | ICD-10-CM | POA: Insufficient documentation

## 2020-11-16 DIAGNOSIS — K625 Hemorrhage of anus and rectum: Secondary | ICD-10-CM

## 2020-11-16 DIAGNOSIS — K909 Intestinal malabsorption, unspecified: Secondary | ICD-10-CM | POA: Diagnosis present

## 2020-11-16 DIAGNOSIS — D500 Iron deficiency anemia secondary to blood loss (chronic): Secondary | ICD-10-CM

## 2020-11-16 MED ORDER — SODIUM CHLORIDE 0.9 % IV SOLN
Freq: Once | INTRAVENOUS | Status: AC
  Filled 2020-11-16: qty 250

## 2020-11-16 MED ORDER — SODIUM CHLORIDE 0.9 % IV SOLN
300.0000 mg | Freq: Once | INTRAVENOUS | Status: AC
  Administered 2020-11-16: 300 mg via INTRAVENOUS
  Filled 2020-11-16: qty 10

## 2020-11-16 NOTE — Patient Instructions (Signed)

## 2020-12-05 ENCOUNTER — Other Ambulatory Visit: Payer: Medicare HMO

## 2020-12-05 ENCOUNTER — Encounter: Payer: Self-pay | Admitting: Internal Medicine

## 2020-12-06 ENCOUNTER — Other Ambulatory Visit: Payer: Medicare HMO

## 2020-12-06 DIAGNOSIS — A048 Other specified bacterial intestinal infections: Secondary | ICD-10-CM

## 2020-12-07 LAB — HELICOBACTER PYLORI  SPECIAL ANTIGEN
MICRO NUMBER:: 11797972
SPECIMEN QUALITY: ADEQUATE

## 2020-12-11 NOTE — Progress Notes (Deleted)
Brittany Evans Date of Birth: 02/19/1956 MRN: [id number] Primary Care Provider:Smith, Malva Limes., FNP Former Cardiology Providers: Jeri Lager, APRN, FNP-C  Primary Cardiologist: Rex Kras, DO, [facility] (established care 12/14/2019)  ***  No chief complaint on file.   HPI  Brittany Evans is a 65 y.o.  female who presents to the office with a chief complaint of " ***." Patient's past medical history and cardiovascular risk factors include: Established coronary artery disease, recovered cardiomyopathy, non-insulin-dependent diabetes mellitus type 2, Hypertension, hyperlipidemia, obesity due to excess calories, postmenopausal female.  Patient is being followed for the management of her underlying coronary artery disease and recovered cardiomyopathy.  She is here for 64-month follow-up for her underlying CAD and history of cardiomyopathy management.  Since last office visit she is doing well from a cardiovascular standpoint.  No hospitalizations or urgent care visits for cardiovascular symptoms since last office encounter.  No use of sublingual nitroglycerin tablets.  Patient states that she is compliant with her medical therapy.  Her overall functional status remains stable without any change in overall endurance.  FUNCTIONAL STATUS: Walks one mile per week.    ALLERGIES: Allergies  Allergen Reactions  . Trulicity [Dulaglutide] Nausea And Vomiting     MEDICATION LIST PRIOR TO VISIT: Current Outpatient Medications on File Prior to Visit  Medication Sig Dispense Refill  . ADVAIR HFA 115-21 MCG/ACT inhaler Inhale 2 puffs into the lungs 2 (two) times daily as needed (sob and wheezing).    Marland Kitchen aspirin 81 MG tablet Take 1 tablet (81 mg total) by mouth daily. 30 tablet 3  . bismuth-metronidazole-tetracycline (PYLERA) 140-125-125 MG capsule Take 3 capsules by mouth 4 (four) times daily -  before meals and at bedtime. 120 capsule 0  . glipiZIDE (GLUCOTROL) 10 MG tablet Take 10 mg by  mouth daily before breakfast.    . HYDROcodone-acetaminophen (NORCO/VICODIN) 5-325 MG tablet Take 1-2 tablets by mouth every 6 (six) hours as needed. 6 tablet 0  . lisinopril (ZESTRIL) 10 MG tablet Take 10 mg by mouth daily.     . metFORMIN (GLUCOPHAGE-XR) 500 MG 24 hr tablet Take 500 mg by mouth daily.    . metoprolol tartrate (LOPRESSOR) 25 MG tablet TAKE 1 TABLET BY MOUTH TWICE DAILY 60 tablet 10  . montelukast (SINGULAIR) 10 MG tablet Take 1 tablet by mouth at bedtime.    . Multiple Vitamin (MULTI-VITAMINS) TABS TAKE 1 TABLET BY MOUTH ONCE DAILY 30 tablet 3  . nitroGLYCERIN (NITROSTAT) 0.4 MG SL tablet Place 1 tablet (0.4 mg total) under the tongue every 5 (five) minutes as needed. If you require more than two tablets five minutes apart call 911 and go to nearest ER via EMS. 10 tablet 0  . NYAMYC powder Apply 1 Units topically 2 (two) times daily.    Marland Kitchen omeprazole (PRILOSEC) 20 MG capsule Take 1 capsule (20 mg total) by mouth 2 (two) times daily before a meal. 20 capsule 0  . simvastatin (ZOCOR) 40 MG tablet Take 1 tablet (40 mg total) by mouth at bedtime. 90 tablet 3  . spironolactone (ALDACTONE) 50 MG tablet Take 50 mg by mouth daily.    . VENTOLIN HFA 108 (90 Base) MCG/ACT inhaler Inhale 1-2 puffs into the lungs every 6 (six) hours as needed for wheezing or shortness of breath.     No current facility-administered medications on file prior to visit.    PAST MEDICAL HISTORY: Past Medical History:  Diagnosis Date  . Allergy   . Anemia  low iron- has appt to see MD  . Asthma    inhaler use as needed rarely  . CAD (coronary artery disease)    NSTEMI 2/12: LAD 30%, D1 40%, oCFX 70-80%, mRCA 10-20%, ?occl. of apical LAD; treated medically  . Cataract   . CHF (congestive heart failure) ([place])   . Chronic kidney disease    stage 3 chronic kidney disease- pt states she is asymptomatic  . Complication of anesthesia    Lightheaded for several days  . Diabetes mellitus   . DVT (deep  venous thrombosis) (HCC)    left leg x2  . Dysrhythmia   . Hyperlipidemia   . Hypertension   . Myocardial infarction ([place])   . Neuromuscular disorder (HCC)    neuropathy due to DM  . NICM (nonischemic cardiomyopathy) ([place])    EF 20-25%, 45% 2012  . Obesity   . Peripheral vascular disease ([place])   . Systolic CHF ([place])     PAST SURGICAL HISTORY: Past Surgical History:  Procedure Laterality Date  . COLONOSCOPY    . DILATATION & CURETTAGE/HYSTEROSCOPY WITH MYOSURE N/A 07/18/2014   Procedure: DILATATION & CURETTAGE/HYSTEROSCOPY WITH MYOSURE;  Surgeon: Woodroe Mode, MD;  Location: [facility];  Service: Gynecology;  Laterality: N/A;  . DILATATION & CURRETTAGE/HYSTEROSCOPY WITH RESECTOCOPE N/A 04/12/2015   Procedure:  DIAGNOSTIC HYSTEROSCOPY WITH RESECTION OF FIBROID AND POLYPS;  Surgeon: Servando Salina, MD;  Location: [facility];  Service: Gynecology;  Laterality: N/A;  . DILATION AND CURETTAGE OF UTERUS N/A 04/12/2015   Procedure: DILATATION AND CURETTAGE;  Surgeon: Servando Salina, MD;  Location: [facility];  Service: Gynecology;  Laterality: N/A;  . None    . POLYPECTOMY N/A 07/18/2014   Procedure: Vaginal POLYPECTOMY;  Surgeon: Woodroe Mode, MD;  Location: [facility];  Service: Gynecology;  Laterality: N/A;  . WISDOM TOOTH EXTRACTION      FAMILY HISTORY: The patient's family history includes Alcohol abuse in her father; Breast cancer in her cousin; Cancer in her mother and sister; Cirrhosis in her father; Coronary artery disease in her mother; Diabetes in her mother; Heart disease in her father and mother; Lung disease in her sister; Pancreatic cancer in her maternal grandmother; Rectal cancer in her mother.   SOCIAL HISTORY:  The patient  reports that she has never smoked. She has never used smokeless tobacco. She reports that she does not drink alcohol and does not use drugs.  Review of Systems  Constitutional: Negative for chills and fever.  HENT: Negative for hoarse voice and  nosebleeds.   Eyes: Negative for discharge, double vision and pain.  Cardiovascular: Negative for chest pain, claudication, dyspnea on exertion, leg swelling, near-syncope, orthopnea, palpitations, paroxysmal nocturnal dyspnea and syncope.  Respiratory: Negative for hemoptysis and shortness of breath.   Musculoskeletal: Negative for muscle cramps and myalgias.  Gastrointestinal: Negative for abdominal pain, constipation, diarrhea, hematemesis, hematochezia, melena, nausea and vomiting.  Neurological: Negative for dizziness and light-headedness.    PHYSICAL EXAM: Vitals with BMI 11/16/2020 11/16/2020 11/09/2020  Height - - -  Weight - - -  BMI - - -  Systolic 932 355 732  Diastolic 61 82 64  Pulse 60 85 51   CONSTITUTIONAL: Well-developed and well-nourished. No acute distress.  SKIN: Skin is warm and dry. No rash noted. No cyanosis. No pallor. No jaundice HEAD: Normocephalic and atraumatic.  EYES: No scleral icterus MOUTH/THROAT: Moist oral membranes.  NECK: No JVD present. No thyromegaly noted. No carotid bruits  LYMPHATIC: No visible cervical adenopathy.  CHEST Normal respiratory effort. No intercostal retractions  LUNGS: Clear to auscultation bilaterally.  No stridor. No wheezes. No rales.  CARDIOVASCULAR: Regular rate and rhythm, positive S1-S2, no murmurs rubs or gallops appreciated. ABDOMINAL: Obese, soft, nontender, nondistended, positive bowel sounds all 4 quadrants.  No apparent ascites.  EXTREMITIES: No peripheral edema  HEMATOLOGIC: No significant bruising NEUROLOGIC: Oriented to person, place, and time. Nonfocal. Normal muscle tone.  PSYCHIATRIC: Normal mood and affect. Normal behavior. Cooperative  CARDIAC DATABASE: EKG: 08/07/2020:  Sinus rhythm Abnormal T, consider ischemia, lateral leads Baseline wander in lead(s) V3 No significant change since last tracing  Echocardiogram: 07/24/2020:  Left ventricle cavity is normal in size. Mild concentric hypertrophy of  the  left ventricle. Normal global wall motion. LVEF 55-60%. Doppler  evidence of grade I (impaired) diastolic dysfunction, normal LAP.  Calculated EF 60%.  Left atrial cavity is mildly dilated.  Mild (Grade I) mitral regurgitation.  Mild tricuspid regurgitation.  No evidence of pulmonary hypertension.  No significant change compared to previous study on 09/02/2017,  Stress Testing:  06/25/2020:  Non-diagnostic ECG stress. Mild soft tissue attenuation in inferior wall. A small sized reversible mild defect in the inferior region cannot be excluded.  The LV is normal in size in both rest and stress images. Overall LV systolic function is abnormal without regional wall motion abnormalities. Global hypokinesis of the left ventricle. Stress LV EF: 39%.  Findings may represent non ischemic cardiomyopathy.  No previous exam available for comparison. Intermediate risk due to low LVEF. Marland Kitchen Clinical correlation recommended.    Heart Catheterization: Coronary angiogram 2/12: LAD 30%, D1 40%, oCFX 70-80%, mRCA 10-20%, ?occl. of apical LAD; Medical therapy. LVEF 30%  LABORATORY DATA: CBC Latest Ref Rng & Units 10/11/2020 08/07/2020 04/11/2015  WBC 4.0 - 10.5 K/uL 4.8 6.0 5.1  Hemoglobin 12.0 - 15.0 g/dL 8.0 Repeated and verified X2.(LL) 11.2(L) 13.8  Hematocrit 36.0 - 46.0 % 25.2 Repeated and verified X2.(L) 34.4(L) 40.8  Platelets 150.0 - 400.0 K/uL 219.0 207 237    CMP Latest Ref Rng & Units 08/07/2020 04/11/2015 07/17/2014  Glucose 70 - 99 mg/dL 157(H) 112(H) 91  BUN 8 - 23 mg/dL 29(H) 22(H) 17  Creatinine 0.44 - 1.00 mg/dL 1.47(H) 1.34(H) 1.33(H)  Sodium 135 - 145 mmol/L 138 139 140  Potassium 3.5 - 5.1 mmol/L 4.2 4.0 4.2  Chloride 98 - 111 mmol/L 105 107 105  CO2 22 - 32 mmol/L 23 23 23   Calcium 8.9 - 10.3 mg/dL 9.5 9.8 9.6  Total Protein 6.5 - 8.1 g/dL 7.1 - -  Total Bilirubin 0.3 - 1.2 mg/dL 0.7 - -  Alkaline Phos 38 - 126 U/L 54 - -  AST 15 - 41 U/L 18 - -  ALT 0 - 44 U/L 22 - -    Lipid  Panel     Component Value Date/Time   CHOL 130 06/14/2013 1357   TRIG 108 06/14/2013 1357   HDL 50 06/14/2013 1357   CHOLHDL 2.6 06/14/2013 1357   VLDL 22 06/14/2013 1357   LDLCALC 58 06/14/2013 1357    Lab Results  Component Value Date   HGBA1C 7.2 01/02/2014   HGBA1C 7.4 09/29/2013   HGBA1C 7.4 06/14/2013   No components found for: NTPROBNP Lab Results  Component Value Date   TSH 1.991 03/11/2013   TSH 1.602 05/03/2011   TSH 1.962 10/02/2010    Cardiac Panel (last 3 results) No results for input(s): CKTOTAL, CKMB, TROPONINIHS, RELINDX in the last 72 hours.  FINAL MEDICATION LIST END OF ENCOUNTER: No orders of the defined types were placed in this encounter.     Current Outpatient Medications:  .  ADVAIR HFA 115-21 MCG/ACT inhaler, Inhale 2 puffs into the lungs 2 (two) times daily as needed (sob and wheezing)., Disp: , Rfl:  .  aspirin 81 MG tablet, Take 1 tablet (81 mg total) by mouth daily., Disp: 30 tablet, Rfl: 3 .  bismuth-metronidazole-tetracycline (PYLERA) 140-125-125 MG capsule, Take 3 capsules by mouth 4 (four) times daily -  before meals and at bedtime., Disp: 120 capsule, Rfl: 0 .  glipiZIDE (GLUCOTROL) 10 MG tablet, Take 10 mg by mouth daily before breakfast., Disp: , Rfl:  .  HYDROcodone-acetaminophen (NORCO/VICODIN) 5-325 MG tablet, Take 1-2 tablets by mouth every 6 (six) hours as needed., Disp: 6 tablet, Rfl: 0 .  lisinopril (ZESTRIL) 10 MG tablet, Take 10 mg by mouth daily. , Disp: , Rfl:  .  metFORMIN (GLUCOPHAGE-XR) 500 MG 24 hr tablet, Take 500 mg by mouth daily., Disp: , Rfl:  .  metoprolol tartrate (LOPRESSOR) 25 MG tablet, TAKE 1 TABLET BY MOUTH TWICE DAILY, Disp: 60 tablet, Rfl: 10 .  montelukast (SINGULAIR) 10 MG tablet, Take 1 tablet by mouth at bedtime., Disp: , Rfl:  .  Multiple Vitamin (MULTI-VITAMINS) TABS, TAKE 1 TABLET BY MOUTH ONCE DAILY, Disp: 30 tablet, Rfl: 3 .  nitroGLYCERIN (NITROSTAT) 0.4 MG SL tablet, Place 1 tablet (0.4 mg total)  under the tongue every 5 (five) minutes as needed. If you require more than two tablets five minutes apart call 911 and go to nearest ER via EMS., Disp: 10 tablet, Rfl: 0 .  NYAMYC powder, Apply 1 Units topically 2 (two) times daily., Disp: , Rfl:  .  omeprazole (PRILOSEC) 20 MG capsule, Take 1 capsule (20 mg total) by mouth 2 (two) times daily before a meal., Disp: 20 capsule, Rfl: 0 .  simvastatin (ZOCOR) 40 MG tablet, Take 1 tablet (40 mg total) by mouth at bedtime., Disp: 90 tablet, Rfl: 3 .  spironolactone (ALDACTONE) 50 MG tablet, Take 50 mg by mouth daily., Disp: , Rfl:  .  VENTOLIN HFA 108 (90 Base) MCG/ACT inhaler, Inhale 1-2 puffs into the lungs every 6 (six) hours as needed for wheezing or shortness of breath., Disp: , Rfl:   IMPRESSION:  No diagnosis found.   RECOMMENDATIONS: Brittany Evans is a 65 y.o. female whose past medical history and cardiovascular risk factors include: Established coronary artery disease, recovered cardiomyopathy, non-insulin-dependent diabetes mellitus type 2, Hypertension, hyperlipidemia, obesity due to excess calories, postmenopausal female.  Established coronary artery disease without angina pectoris: Stable  Since last office visit patient has been stable from a cardiovascular standpoint.  She has not required the use of sublingual nitroglycerin tablets.  And her overall functional status remains stable as well.    Medications reconciled.  EKG today shows normal sinus rhythm with ST depressions in the high lateral and lateral leads suggestive of possible anterolateral ischemia.  Shared decision was to proceed with stress test to evaluate for reversible ischemia given her underlying history of coronary artery disease and multiple cardiovascular risk factors including diabetes.  Reeducated on the importance of working on her modifiable cardiovascular risk factors which include weight loss, glycemic control, and lipid management.  I do not have  the most recent lipid profile for review.  She states that her lipids are being followed by her primary care provider.  Would recommend an LDL level of less than 70 mg/dL  given her history of non-insulin-dependent diabetes mellitus type 2.  For now would recommend increasing physical activity as tolerated with a goal of 30 minutes a day 5 days a week.  Recovered cardiomyopathy: Continue guideline directed medical therapy.  Benign essential hypertension: Currently managed by nephrology.  Antihypertensive medications reconciled.  Non-insulin-dependent diabetes mellitus type 2: Currently managed by primary team.  Currently on oral antiglycemic agents.  Educated on importance of glycemic control given her history of CAD.  Obesity, due to excess calories: There is no height or weight on file to calculate BMI. . I reviewed with the patient the importance of diet, regular physical activity/exercise, weight loss.   . Patient is educated on increasing physical activity gradually as tolerated.  With the goal of moderate intensity exercise for 30 minutes a day 5 days a week.  I like to see her back in 5-month follow-up for management of CAD or sooner if her stress test is abnormal.  Also requested records from her PCPs office to get the most recent lab work for reference.  No orders of the defined types were placed in this encounter.  --Continue cardiac medications as reconciled in final medication list. --No follow-ups on file. Or sooner if needed. --Continue follow-up with your primary care physician regarding the management of your other chronic comorbid conditions.  Patient's questions and concerns were addressed to her satisfaction. She voices understanding of the instructions provided during this encounter.   This note was created using a voice recognition software as a result there may be grammatical errors inadvertently enclosed that do not reflect the nature of this encounter. Every attempt is  made to correct such errors.  Total time spent 33 minutes.  Rex Kras, [place], [facility]  Pager: [phone] Office: [phone]

## 2020-12-12 ENCOUNTER — Other Ambulatory Visit: Payer: Self-pay | Admitting: Cardiology

## 2020-12-13 ENCOUNTER — Ambulatory Visit: Payer: Medicare Other | Admitting: Cardiology

## 2020-12-13 DIAGNOSIS — I429 Cardiomyopathy, unspecified: Secondary | ICD-10-CM

## 2020-12-13 DIAGNOSIS — R9431 Abnormal electrocardiogram [ECG] [EKG]: Secondary | ICD-10-CM

## 2020-12-13 DIAGNOSIS — I2510 Atherosclerotic heart disease of native coronary artery without angina pectoris: Secondary | ICD-10-CM

## 2020-12-13 DIAGNOSIS — E782 Mixed hyperlipidemia: Secondary | ICD-10-CM

## 2020-12-13 DIAGNOSIS — E119 Type 2 diabetes mellitus without complications: Secondary | ICD-10-CM

## 2020-12-13 DIAGNOSIS — I10 Essential (primary) hypertension: Secondary | ICD-10-CM

## 2021-01-04 ENCOUNTER — Other Ambulatory Visit: Payer: Self-pay

## 2021-01-04 ENCOUNTER — Inpatient Hospital Stay: Payer: Medicare HMO

## 2021-01-04 ENCOUNTER — Encounter: Payer: Self-pay | Admitting: Hematology and Oncology

## 2021-01-04 ENCOUNTER — Inpatient Hospital Stay: Payer: Medicare HMO | Attending: Hematology and Oncology | Admitting: Hematology and Oncology

## 2021-01-04 DIAGNOSIS — Z7951 Long term (current) use of inhaled steroids: Secondary | ICD-10-CM | POA: Diagnosis not present

## 2021-01-04 DIAGNOSIS — N183 Chronic kidney disease, stage 3 unspecified: Secondary | ICD-10-CM

## 2021-01-04 DIAGNOSIS — R001 Bradycardia, unspecified: Secondary | ICD-10-CM

## 2021-01-04 DIAGNOSIS — D539 Nutritional anemia, unspecified: Secondary | ICD-10-CM

## 2021-01-04 DIAGNOSIS — Z79899 Other long term (current) drug therapy: Secondary | ICD-10-CM | POA: Diagnosis not present

## 2021-01-04 DIAGNOSIS — Z7984 Long term (current) use of oral hypoglycemic drugs: Secondary | ICD-10-CM | POA: Diagnosis not present

## 2021-01-04 DIAGNOSIS — D509 Iron deficiency anemia, unspecified: Secondary | ICD-10-CM | POA: Diagnosis present

## 2021-01-04 DIAGNOSIS — D500 Iron deficiency anemia secondary to blood loss (chronic): Secondary | ICD-10-CM

## 2021-01-04 DIAGNOSIS — Z7982 Long term (current) use of aspirin: Secondary | ICD-10-CM | POA: Insufficient documentation

## 2021-01-04 LAB — CBC WITH DIFFERENTIAL/PLATELET
Abs Immature Granulocytes: 0.01 10*3/uL (ref 0.00–0.07)
Basophils Absolute: 0 10*3/uL (ref 0.0–0.1)
Basophils Relative: 1 %
Eosinophils Absolute: 0.1 10*3/uL (ref 0.0–0.5)
Eosinophils Relative: 3 %
HCT: 40.2 % (ref 36.0–46.0)
Hemoglobin: 13 g/dL (ref 12.0–15.0)
Immature Granulocytes: 0 %
Lymphocytes Relative: 30 %
Lymphs Abs: 1.3 10*3/uL (ref 0.7–4.0)
MCH: 25.4 pg — ABNORMAL LOW (ref 26.0–34.0)
MCHC: 32.3 g/dL (ref 30.0–36.0)
MCV: 78.5 fL — ABNORMAL LOW (ref 80.0–100.0)
Monocytes Absolute: 0.3 10*3/uL (ref 0.1–1.0)
Monocytes Relative: 8 %
Neutro Abs: 2.5 10*3/uL (ref 1.7–7.7)
Neutrophils Relative %: 58 %
Platelets: 203 10*3/uL (ref 150–400)
RBC: 5.12 MIL/uL — ABNORMAL HIGH (ref 3.87–5.11)
RDW: 22.5 % — ABNORMAL HIGH (ref 11.5–15.5)
WBC: 4.2 10*3/uL (ref 4.0–10.5)
nRBC: 0 % (ref 0.0–0.2)

## 2021-01-04 LAB — COMPREHENSIVE METABOLIC PANEL
ALT: 16 U/L (ref 0–44)
AST: 18 U/L (ref 15–41)
Albumin: 3.9 g/dL (ref 3.5–5.0)
Alkaline Phosphatase: 82 U/L (ref 38–126)
Anion gap: 10 (ref 5–15)
BUN: 26 mg/dL — ABNORMAL HIGH (ref 8–23)
CO2: 21 mmol/L — ABNORMAL LOW (ref 22–32)
Calcium: 10 mg/dL (ref 8.9–10.3)
Chloride: 107 mmol/L (ref 98–111)
Creatinine, Ser: 1.77 mg/dL — ABNORMAL HIGH (ref 0.44–1.00)
GFR, Estimated: 32 mL/min — ABNORMAL LOW (ref >60)
Glucose, Bld: 229 mg/dL — ABNORMAL HIGH (ref 70–99)
Potassium: 4.2 mmol/L (ref 3.5–5.1)
Sodium: 138 mmol/L (ref 135–145)
Total Bilirubin: 0.7 mg/dL (ref 0.3–1.2)
Total Protein: 7.4 g/dL (ref 6.5–8.1)

## 2021-01-04 LAB — IRON AND TIBC
Iron: 77 ug/dL (ref 41–142)
Saturation Ratios: 19 % — ABNORMAL LOW (ref 21–57)
TIBC: 404 ug/dL (ref 236–444)
UIBC: 327 ug/dL (ref 120–384)

## 2021-01-04 LAB — FERRITIN: Ferritin: 37 ng/mL (ref 11–307)

## 2021-01-04 LAB — VITAMIN B12: Vitamin B-12: 322 pg/mL (ref 180–914)

## 2021-01-04 NOTE — Assessment & Plan Note (Signed)
After aggressive intravenous iron infusion, she is no longer anemic Repeat iron studies show borderline iron deficiency We will cancel her treatment today and reschedule for 3 months She denies further rectal bleeding

## 2021-01-04 NOTE — Assessment & Plan Note (Signed)
She has significant bradycardia and intermittently symptomatic I recommend she reduce his metoprolol to half a tablet whenever her heart rate is less than 60 As above, recommend close monitoring and follow-up with her cardiologist

## 2021-01-04 NOTE — Progress Notes (Signed)
Brittany Evans OFFICE PROGRESS NOTE  Brittany Evans., FNP  ASSESSMENT & PLAN:  Iron deficiency anemia due to chronic blood loss After aggressive intravenous iron infusion, she is no longer anemic Repeat iron studies show borderline iron deficiency We will cancel her treatment today and reschedule for 3 months She denies further rectal bleeding  CKD (chronic kidney disease) stage 3, GFR 30-59 ml/min (HCC) She has slight worsening renal function The patient is noted to be on Aldactone I recommend she increase oral fluid intake She has appointment to see her cardiologist for evaluation in 2 weeks and I recommend for her blood to be rechecked then  Bradycardia She has significant bradycardia and intermittently symptomatic I recommend she reduce his metoprolol to half a tablet whenever her heart rate is less than 60 As above, recommend close monitoring and follow-up with her cardiologist   No orders of the defined types were placed in this encounter.   The total time spent in the appointment was 20 minutes encounter with patients including review of chart and various tests results, discussions about plan of care and coordination of care plan   All questions were answered. The patient knows to call the clinic with any problems, questions or concerns. No barriers to learning was detected.    Brittany Lark, MD 5/20/20221:30 PM  INTERVAL HISTORY: Brittany Evans 65 y.o. female returns for further follow-up on severe iron deficiency anemia She had received 3 doses of intravenous iron recently Initially, she has persistent fatigue but since then her energy level has gradually improved She denies further pica She denies further rectal bleeding  SUMMARY OF HEMATOLOGIC HISTORY: Brittany Evans 65 y.o. female is here because of severe iron deficiency anemia  She was found to have abnormal CBC from recent blood work I have the opportunity to review her CBC dated back  to 12/09/2007 Her prior hemoglobin has been normal or as high as 15.4 noted on 03/11/2013 Starting on 08/07/2020, she was noted to be anemic with hemoglobin of 12.2.  She presented to the emergency room with rectal bleeding and was referred to GI service for evaluation Her most recent hemoglobin on 10/11/2020 showed hemoglobin of 8, MCV of 73.4  She denies recent chest pain on exertion but had recent shortness of breath on minimal exertion, several syncopal episodes leading to sprained ankle and palpitations. She had not noticed any recent bleeding such as epistaxis, hematuria or hemoptysis.  She had her last menstruation was 4 years ago.  She has no further postmenopausal bleeding after D&C.  Result of her D&C was normal  The patient denies over the counter NSAID ingestion. She is on antiplatelets agent with aspirin for secondary prevention. Her last colonoscopy was on 10/17/2020. Report showed  - Diverticulosis in the sigmoid colon, in the descending colon and in the transverse colon. This may explain 2 episodes of hematochezia, but not iron deficiency. - The examination was otherwise normal on direct and retroflexion views. - No specimens collected.  EGD on 10/17/2020 showed  - Normal esophagus. - A single gastric polyp. Resected and retrieved. - Normal examined duodenum. Biopsied. - Biopsies were taken with a cold forceps for histology and Helicobacter pylori testing.  She had no prior history or diagnosis of cancer. Her age appropriate screening programs are up-to-date. She has pica with ice craving and eats a variety of diet. She never donated blood or received blood transfusion.  This is due to her religious belief being Jehovah witness The  patient was prescribed oral iron supplements and she takes daily without benefit so far From 11/02/2020 to 11/16/2020, she received 3 doses of intravenous iron sucrose 300 mg dose  I have reviewed the past medical history, past surgical history, social  history and family history with the patient and they are unchanged from previous note.  ALLERGIES:  is allergic to trulicity [dulaglutide].  MEDICATIONS:  Current Outpatient Medications  Medication Sig Dispense Refill  . ADVAIR HFA 115-21 MCG/ACT inhaler Inhale 2 puffs into the lungs 2 (two) times daily as needed (sob and wheezing).    Marland Kitchen aspirin 81 MG tablet Take 1 tablet (81 mg total) by mouth daily. 30 tablet 3  . glipiZIDE (GLUCOTROL) 10 MG tablet Take 10 mg by mouth daily before breakfast.    . lisinopril (ZESTRIL) 10 MG tablet Take 10 mg by mouth daily.     . metFORMIN (GLUCOPHAGE-XR) 500 MG 24 hr tablet Take 500 mg by mouth daily.    . metoprolol tartrate (LOPRESSOR) 25 MG tablet TAKE 1 TABLET(25 MG) BY MOUTH TWICE DAILY 180 tablet 0  . montelukast (SINGULAIR) 10 MG tablet Take 1 tablet by mouth at bedtime.    . Multiple Vitamin (MULTI-VITAMINS) TABS TAKE 1 TABLET BY MOUTH ONCE DAILY 30 tablet 3  . nitroGLYCERIN (NITROSTAT) 0.4 MG SL tablet Place 1 tablet (0.4 mg total) under the tongue every 5 (five) minutes as needed. If you require more than two tablets five minutes apart call 911 and go to nearest ER via EMS. 10 tablet 0  . NYAMYC powder Apply 1 Units topically 2 (two) times daily.    . simvastatin (ZOCOR) 40 MG tablet Take 1 tablet (40 mg total) by mouth at bedtime. 90 tablet 3  . spironolactone (ALDACTONE) 50 MG tablet Take 50 mg by mouth daily.    . VENTOLIN HFA 108 (90 Base) MCG/ACT inhaler Inhale 1-2 puffs into the lungs every 6 (six) hours as needed for wheezing or shortness of breath.     No current facility-administered medications for this visit.     REVIEW OF SYSTEMS:   Constitutional: Denies fevers, chills or night sweats Eyes: Denies blurriness of vision Ears, nose, mouth, throat, and face: Denies mucositis or sore throat Respiratory: Denies cough, dyspnea or wheezes Cardiovascular: Denies palpitation, chest discomfort or lower extremity swelling Gastrointestinal:   Denies nausea, heartburn or change in bowel habits Skin: Denies abnormal skin rashes Lymphatics: Denies new lymphadenopathy or easy bruising Neurological:Denies numbness, tingling or new weaknesses Behavioral/Psych: Mood is stable, no new changes  All other systems were reviewed with the patient and are negative.  PHYSICAL EXAMINATION: ECOG PERFORMANCE STATUS: 1 - Symptomatic but completely ambulatory  Vitals:   01/04/21 0953  BP: 136/67  Pulse: (!) 51  Resp: 18  Temp: (!) 97.4 F (36.3 C)  SpO2: 100%   Filed Weights   01/04/21 0953  Weight: 223 lb 12.8 oz (101.5 kg)    GENERAL:alert, no distress and comfortable NEURO: alert & oriented x 3 with fluent speech, no focal motor/sensory deficits  LABORATORY DATA:  I have reviewed the data as listed     Component Value Date/Time   NA 138 01/04/2021 0924   K 4.2 01/04/2021 0924   CL 107 01/04/2021 0924   CO2 21 (L) 01/04/2021 0924   GLUCOSE 229 (H) 01/04/2021 0924   BUN 26 (H) 01/04/2021 0924   CREATININE 1.77 (H) 01/04/2021 0924   CREATININE 1.23 (H) 04/29/2013 0946   CALCIUM 10.0 01/04/2021 0924   PROT  7.4 01/04/2021 0924   ALBUMIN 3.9 01/04/2021 0924   AST 18 01/04/2021 0924   ALT 16 01/04/2021 0924   ALKPHOS 82 01/04/2021 0924   BILITOT 0.7 01/04/2021 0924   GFRNONAA 32 (L) 01/04/2021 0924   GFRNONAA 48 (L) 03/18/2013 1050   GFRAA 50 (L) 04/11/2015 1000   GFRAA 56 (L) 03/18/2013 1050    No results found for: SPEP, UPEP  Lab Results  Component Value Date   WBC 4.2 01/04/2021   NEUTROABS 2.5 01/04/2021   HGB 13.0 01/04/2021   HCT 40.2 01/04/2021   MCV 78.5 (L) 01/04/2021   PLT 203 01/04/2021      Chemistry      Component Value Date/Time   NA 138 01/04/2021 0924   K 4.2 01/04/2021 0924   CL 107 01/04/2021 0924   CO2 21 (L) 01/04/2021 0924   BUN 26 (H) 01/04/2021 0924   CREATININE 1.77 (H) 01/04/2021 0924   CREATININE 1.23 (H) 04/29/2013 0946      Component Value Date/Time   CALCIUM 10.0  01/04/2021 0924   ALKPHOS 82 01/04/2021 0924   AST 18 01/04/2021 0924   ALT 16 01/04/2021 0924   BILITOT 0.7 01/04/2021 7096

## 2021-01-04 NOTE — Assessment & Plan Note (Signed)
She has slight worsening renal function The patient is noted to be on Aldactone I recommend she increase oral fluid intake She has appointment to see her cardiologist for evaluation in 2 weeks and I recommend for her blood to be rechecked then

## 2021-01-17 ENCOUNTER — Other Ambulatory Visit: Payer: Self-pay

## 2021-01-17 ENCOUNTER — Encounter: Payer: Self-pay | Admitting: Cardiology

## 2021-01-17 ENCOUNTER — Ambulatory Visit: Payer: Medicare HMO | Admitting: Cardiology

## 2021-01-17 VITALS — BP 114/65 | HR 56 | Temp 98.0°F | Resp 16 | Ht 65.0 in | Wt 224.6 lb

## 2021-01-17 DIAGNOSIS — E782 Mixed hyperlipidemia: Secondary | ICD-10-CM

## 2021-01-17 DIAGNOSIS — R9431 Abnormal electrocardiogram [ECG] [EKG]: Secondary | ICD-10-CM

## 2021-01-17 DIAGNOSIS — R001 Bradycardia, unspecified: Secondary | ICD-10-CM

## 2021-01-17 DIAGNOSIS — E119 Type 2 diabetes mellitus without complications: Secondary | ICD-10-CM

## 2021-01-17 DIAGNOSIS — I2510 Atherosclerotic heart disease of native coronary artery without angina pectoris: Secondary | ICD-10-CM

## 2021-01-17 DIAGNOSIS — I10 Essential (primary) hypertension: Secondary | ICD-10-CM

## 2021-01-17 NOTE — Progress Notes (Signed)
[name] Date of Birth: 21-Jul-1956 MRN: [id number] Primary Care Provider:Smith, Malva Limes., FNP Former Cardiology Providers: Jeri Lager, APRN, FNP-C  Primary Cardiologist: Rex Kras, DO, [facility] (established care 12/14/2019)  Date: 01/17/21 Last Office Visit: 06/14/2020  Chief Complaint  Patient presents with  . Atherosclerosis of native coronary artery of native heart w  . Follow-up    HPI  Brittany Evans is a 65 y.o.  female who presents to the office with a chief complaint of " coronary disease follow-up." Patient's past medical history and cardiovascular risk factors include: Established coronary artery disease, recovered cardiomyopathy, non-insulin-dependent diabetes mellitus type 2, Hypertension, hyperlipidemia, obesity due to excess calories, postmenopausal female.  Patient is being followed for the management of her underlying coronary artery disease and recovered cardiomyopathy.  Since last office visit she was found to be anemic and underwent endoscopy.  Has been getting iron infusion. Patient has reduced the dose of metoprolol to tartrate to 25 mg p.o. daily due to underlying lightheaded and dizziness.  Denies any chest pain at rest or with effort related activities.  She denies any heart failure symptoms.  Functional status has been limited due to fracture involving the right lower leg.  FUNCTIONAL STATUS: Limited, due to recent injury to the right lower leg.  ALLERGIES: Allergies  Allergen Reactions  . Trulicity [Dulaglutide] Nausea And Vomiting     MEDICATION LIST PRIOR TO VISIT: Current Outpatient Medications on File Prior to Visit  Medication Sig Dispense Refill  . ADVAIR HFA 115-21 MCG/ACT inhaler Inhale 2 puffs into the lungs 2 (two) times daily as needed (sob and wheezing).    Marland Kitchen albuterol (VENTOLIN HFA) 108 (90 Base) MCG/ACT inhaler Inhale 2 puffs into the lungs every 6 (six) hours as needed for wheezing or shortness of breath.    Marland Kitchen aspirin  81 MG tablet Take 1 tablet (81 mg total) by mouth daily. 30 tablet 3  . bismuth-metronidazole-tetracycline (PYLERA) 140-125-125 MG capsule Take 3 capsules by mouth 4 (four) times daily -  before meals and at bedtime.    Marland Kitchen glipiZIDE (GLUCOTROL) 10 MG tablet Take 10 mg by mouth daily before breakfast.    . HYDROcodone-acetaminophen (NORCO/VICODIN) 5-325 MG tablet Take 2 tablets by mouth every 6 (six) hours as needed for moderate pain.    Marland Kitchen lisinopril (ZESTRIL) 10 MG tablet Take 10 mg by mouth daily.     . metFORMIN (GLUCOPHAGE-XR) 500 MG 24 hr tablet Take 500 mg by mouth daily.    . montelukast (SINGULAIR) 10 MG tablet Take 1 tablet by mouth at bedtime.    . Multiple Vitamin (MULTI-VITAMINS) TABS TAKE 1 TABLET BY MOUTH ONCE DAILY 30 tablet 3  . nitroGLYCERIN (NITROSTAT) 0.4 MG SL tablet Place 1 tablet (0.4 mg total) under the tongue every 5 (five) minutes as needed. If you require more than two tablets five minutes apart call 911 and go to nearest ER via EMS. 10 tablet 0  . NYAMYC powder Apply 1 Units topically 2 (two) times daily.    . simvastatin (ZOCOR) 40 MG tablet Take 1 tablet (40 mg total) by mouth at bedtime. 90 tablet 3  . spironolactone (ALDACTONE) 50 MG tablet Take 50 mg by mouth daily.     No current facility-administered medications on file prior to visit.    PAST MEDICAL HISTORY: Past Medical History:  Diagnosis Date  . Allergy   . Anemia    low iron- has appt to see MD  . Asthma  inhaler use as needed rarely  . CAD (coronary artery disease)    NSTEMI 2/12: LAD 30%, D1 40%, oCFX 70-80%, mRCA 10-20%, ?occl. of apical LAD; treated medically  . Cataract   . CHF (congestive heart failure) ([place])   . Chronic kidney disease    stage 3 chronic kidney disease- pt states she is asymptomatic  . Complication of anesthesia    Lightheaded for several days  . Diabetes mellitus   . DVT (deep venous thrombosis) (HCC)    left leg x2  . Dysrhythmia   . Hyperlipidemia   . Hypertension    . Myocardial infarction ([place])   . Neuromuscular disorder (HCC)    neuropathy due to DM  . NICM (nonischemic cardiomyopathy) ([place])    EF 20-25%, 45% 2012  . Obesity   . Peripheral vascular disease ([place])   . Systolic CHF ([place])     PAST SURGICAL HISTORY: Past Surgical History:  Procedure Laterality Date  . COLONOSCOPY    . DILATATION & CURETTAGE/HYSTEROSCOPY WITH MYOSURE N/A 07/18/2014   Procedure: DILATATION & CURETTAGE/HYSTEROSCOPY WITH MYOSURE;  Surgeon: Woodroe Mode, MD;  Location: [facility];  Service: Gynecology;  Laterality: N/A;  . DILATATION & CURRETTAGE/HYSTEROSCOPY WITH RESECTOCOPE N/A 04/12/2015   Procedure:  DIAGNOSTIC HYSTEROSCOPY WITH RESECTION OF FIBROID AND POLYPS;  Surgeon: Servando Salina, MD;  Location: [facility];  Service: Gynecology;  Laterality: N/A;  . DILATION AND CURETTAGE OF UTERUS N/A 04/12/2015   Procedure: DILATATION AND CURETTAGE;  Surgeon: Servando Salina, MD;  Location: [facility];  Service: Gynecology;  Laterality: N/A;  . None    . POLYPECTOMY N/A 07/18/2014   Procedure: Vaginal POLYPECTOMY;  Surgeon: Woodroe Mode, MD;  Location: [facility];  Service: Gynecology;  Laterality: N/A;  . WISDOM TOOTH EXTRACTION      FAMILY HISTORY: The patient's family history includes Alcohol abuse in her father; Breast cancer in her cousin; Cancer in her mother and sister; Cirrhosis in her father; Coronary artery disease in her mother; Diabetes in her mother; Heart disease in her father and mother; Lung disease in her sister; Pancreatic cancer in her maternal grandmother; Rectal cancer in her mother.   SOCIAL HISTORY:  The patient  reports that she has never smoked. She has never used smokeless tobacco. She reports that she does not drink alcohol and does not use drugs.  Review of Systems  Constitutional: Negative for chills and fever.  HENT: Negative for hoarse voice and nosebleeds.   Eyes: Negative for discharge, double vision and pain.  Cardiovascular: Negative for chest  pain, claudication, dyspnea on exertion, leg swelling, near-syncope, orthopnea, palpitations, paroxysmal nocturnal dyspnea and syncope.  Respiratory: Negative for hemoptysis and shortness of breath.   Musculoskeletal: Negative for muscle cramps and myalgias.  Gastrointestinal: Negative for abdominal pain, constipation, diarrhea, hematemesis, hematochezia, melena, nausea and vomiting.  Neurological: Negative for dizziness and light-headedness.    PHYSICAL EXAM: Vitals with BMI 01/17/2021 01/04/2021 11/16/2020  Height 5\' 5"  5\' 5"  -  Weight 224 lbs 10 oz 223 lbs 13 oz -  BMI 52.77 82.42 -  Systolic 353 614 431  Diastolic 65 67 61  Pulse 56 51 60   CONSTITUTIONAL: Well-developed and well-nourished. No acute distress.  SKIN: Skin is warm and dry. No rash noted. No cyanosis. No pallor. No jaundice HEAD: Normocephalic and atraumatic.  EYES: No scleral icterus MOUTH/THROAT: Moist oral membranes.  NECK: No JVD present. No thyromegaly noted. No carotid bruits  LYMPHATIC: No visible cervical adenopathy.  CHEST Normal respiratory effort.  No intercostal retractions  LUNGS: Clear to auscultation bilaterally.  No stridor. No wheezes. No rales.  CARDIOVASCULAR: Regular rate and rhythm, positive S1-S2, no murmurs rubs or gallops appreciated. ABDOMINAL: Obese, soft, nontender, nondistended, positive bowel sounds all 4 quadrants.  No apparent ascites.  EXTREMITIES: No peripheral edema. Brace on the right lower leg.  HEMATOLOGIC: No significant bruising NEUROLOGIC: Oriented to person, place, and time. Nonfocal. Normal muscle tone.  PSYCHIATRIC: Normal mood and affect. Normal behavior. Cooperative  CARDIAC DATABASE: EKG: 01/17/2021: Sinus bradycardia, 51 bpm, ST-T changes in the high lateral lateral leads cannot rule out ischemia, without underlying injury pattern.  Echocardiogram: 07/24/2020: Left ventricle cavity is normal in size. Mild concentric hypertrophy of the left ventricle. Normal global wall  motion. LVEF 55-60%. Doppler evidence of grade I (impaired) diastolic dysfunction, normal LAP. Calculated EF 60%. Left atrial cavity is mildly dilated. Mild (Grade I) mitral regurgitation. Mild tricuspid regurgitation. No evidence of pulmonary hypertension. No significant change compared to previous study on 09/02/2017.  Stress Testing:  Lexiscan (Walking with mod Bruce) Sestamibi Stress Test 06/25/2020: Non-diagnostic ECG stress. Mild soft tissue attenuation in inferior wall. A small sized reversible mild defect in the inferior region cannot be excluded.  The LV is normal in size in both rest and stress images. Overall LV systolic function is abnormal without regional wall motion abnormalities. Global hypokinesis of the left ventricle. Stress LV EF: 39%. Findings may represent non ischemic cardiomyopathy.  No previous exam available for comparison. Intermediate risk due to low LVEF. Marland Kitchen Clinical correlation recommended.   Heart Catheterization: Coronary angiogram 2/12: LAD 30%, D1 40%, oCFX 70-80%, mRCA 10-20%, ?occl. of apical LAD; Medical therapy. LVEF 30%  LABORATORY DATA: CBC Latest Ref Rng & Units 01/04/2021 10/11/2020 08/07/2020  WBC 4.0 - 10.5 K/uL 4.2 4.8 6.0  Hemoglobin 12.0 - 15.0 g/dL 13.0 8.0 Repeated and verified X2.(LL) 11.2(L)  Hematocrit 36.0 - 46.0 % 40.2 25.2 Repeated and verified X2.(L) 34.4(L)  Platelets 150 - 400 K/uL 203 219.0 207    CMP Latest Ref Rng & Units 01/04/2021 08/07/2020 04/11/2015  Glucose 70 - 99 mg/dL 229(H) 157(H) 112(H)  BUN 8 - 23 mg/dL 26(H) 29(H) 22(H)  Creatinine 0.44 - 1.00 mg/dL 1.77(H) 1.47(H) 1.34(H)  Sodium 135 - 145 mmol/L 138 138 139  Potassium 3.5 - 5.1 mmol/L 4.2 4.2 4.0  Chloride 98 - 111 mmol/L 107 105 107  CO2 22 - 32 mmol/L 21(L) 23 23  Calcium 8.9 - 10.3 mg/dL 10.0 9.5 9.8  Total Protein 6.5 - 8.1 g/dL 7.4 7.1 -  Total Bilirubin 0.3 - 1.2 mg/dL 0.7 0.7 -  Alkaline Phos 38 - 126 U/L 82 54 -  AST 15 - 41 U/L 18 18 -  ALT 0 - 44 U/L  16 22 -    Lipid Panel     Component Value Date/Time   CHOL 130 06/14/2013 1357   TRIG 108 06/14/2013 1357   HDL 50 06/14/2013 1357   CHOLHDL 2.6 06/14/2013 1357   VLDL 22 06/14/2013 1357   LDLCALC 58 06/14/2013 1357    Lab Results  Component Value Date   HGBA1C 7.2 01/02/2014   HGBA1C 7.4 09/29/2013   HGBA1C 7.4 06/14/2013   No components found for: NTPROBNP Lab Results  Component Value Date   TSH 1.991 03/11/2013   TSH 1.602 05/03/2011   TSH 1.962 10/02/2010    Cardiac Panel (last 3 results) No results for input(s): CKTOTAL, CKMB, TROPONINIHS, RELINDX in the last 72 hours.  FINAL MEDICATION  LIST END OF ENCOUNTER: No orders of the defined types were placed in this encounter.     Current Outpatient Medications:  .  ADVAIR HFA 115-21 MCG/ACT inhaler, Inhale 2 puffs into the lungs 2 (two) times daily as needed (sob and wheezing)., Disp: , Rfl:  .  albuterol (VENTOLIN HFA) 108 (90 Base) MCG/ACT inhaler, Inhale 2 puffs into the lungs every 6 (six) hours as needed for wheezing or shortness of breath., Disp: , Rfl:  .  aspirin 81 MG tablet, Take 1 tablet (81 mg total) by mouth daily., Disp: 30 tablet, Rfl: 3 .  bismuth-metronidazole-tetracycline (PYLERA) 140-125-125 MG capsule, Take 3 capsules by mouth 4 (four) times daily -  before meals and at bedtime., Disp: , Rfl:  .  glipiZIDE (GLUCOTROL) 10 MG tablet, Take 10 mg by mouth daily before breakfast., Disp: , Rfl:  .  HYDROcodone-acetaminophen (NORCO/VICODIN) 5-325 MG tablet, Take 2 tablets by mouth every 6 (six) hours as needed for moderate pain., Disp: , Rfl:  .  lisinopril (ZESTRIL) 10 MG tablet, Take 10 mg by mouth daily. , Disp: , Rfl:  .  metFORMIN (GLUCOPHAGE-XR) 500 MG 24 hr tablet, Take 500 mg by mouth daily., Disp: , Rfl:  .  montelukast (SINGULAIR) 10 MG tablet, Take 1 tablet by mouth at bedtime., Disp: , Rfl:  .  Multiple Vitamin (MULTI-VITAMINS) TABS, TAKE 1 TABLET BY MOUTH ONCE DAILY, Disp: 30 tablet, Rfl: 3 .   nitroGLYCERIN (NITROSTAT) 0.4 MG SL tablet, Place 1 tablet (0.4 mg total) under the tongue every 5 (five) minutes as needed. If you require more than two tablets five minutes apart call 911 and go to nearest ER via EMS., Disp: 10 tablet, Rfl: 0 .  NYAMYC powder, Apply 1 Units topically 2 (two) times daily., Disp: , Rfl:  .  simvastatin (ZOCOR) 40 MG tablet, Take 1 tablet (40 mg total) by mouth at bedtime., Disp: 90 tablet, Rfl: 3 .  spironolactone (ALDACTONE) 50 MG tablet, Take 50 mg by mouth daily., Disp: , Rfl:   IMPRESSION:    ICD-10-CM   1. Atherosclerosis of native coronary artery of native heart without angina pectoris  I25.10 EKG 12-Lead  2. Benign hypertension  I10   3. Mixed hyperlipidemia  E78.2   4. Non-insulin dependent type 2 diabetes mellitus ([place])  E11.9   5. Class 2 severe obesity due to excess calories with serious comorbidity and body mass index (BMI) of 39.0 to 39.9 in adult (HCC)  E66.01    Z68.39   6. Nonspecific abnormal electrocardiogram (ECG) (EKG)  R94.31   7. Bradycardia  R00.1      RECOMMENDATIONS: Brittany Evans is a 65 y.o. female whose past medical history and cardiovascular risk factors include: Established coronary artery disease, recovered cardiomyopathy, non-insulin-dependent diabetes mellitus type 2, Hypertension, hyperlipidemia, obesity due to excess calories, postmenopausal female.  Established coronary artery disease without angina pectoris: Stable  Stable from a cardiovascular standpoint.  Denies any chest pain or angina pectoris.  No use of sublingual nitroglycerin tablets.  Medications reconciled.  Patient is bradycardic on today's evaluation and given the recent course of events we will hold off Lopressor for now.  Reviewed the echocardiogram and stress test results with her in great detail which were performed since last office visit.  Patient was found to have a very small sized reversible perfusion defect involving the inferior region  which was contaminated due to soft tissue attenuation by the abdominal obesity and breast tissue.  She has been asymptomatic  for 6 months and favors medical management for now which is very reasonable.    LVEF per echocardiogram is preserved.    Reeducated on the importance of working on her modifiable cardiovascular risk factors which include weight loss, glycemic control, and lipid management.  Once the right lower extremity injury is resolved recommend increasing physical activity to 30 minutes a day 5 days a week as tolerated.  Bradycardia:  Asymptomatic.  Hold Lopressor 25 mg p.o. daily.  EKG reviewed findings noted above.  Recovered cardiomyopathy: Continue guideline directed medical therapy.  Benign essential hypertension:   Currently at goal.    Currently managed by nephrology.    Low-salt diet recommended.   Non-insulin-dependent diabetes mellitus type 2: Currently managed by primary team.  Currently on oral antiglycemic agents.  Educated on importance of glycemic control given her history of CAD.  Obesity, due to excess calories: Body mass index is 37.38 kg/m. . I reviewed with the patient the importance of diet, regular physical activity/exercise, weight loss.   . Patient is educated on increasing physical activity gradually as tolerated.  With the goal of moderate intensity exercise for 30 minutes a day 5 days a week.  Orders Placed This Encounter  Procedures  . EKG 12-Lead   --Continue cardiac medications as reconciled in final medication list. --Return in about 6 months (around 07/19/2021) for Follow up.  Recovered cardiomyopathy.. Or sooner if needed. --Continue follow-up with your primary care physician regarding the management of your other chronic comorbid conditions.  Patient's questions and concerns were addressed to her satisfaction. She voices understanding of the instructions provided during this encounter.   This note was created using a voice  recognition software as a result there may be grammatical errors inadvertently enclosed that do not reflect the nature of this encounter. Every attempt is made to correct such errors.  Total time spent 33 minutes.  Rex Kras, [place], [facility]  Pager: [phone] Office: [phone]

## 2021-04-05 ENCOUNTER — Telehealth: Payer: Self-pay

## 2021-04-05 ENCOUNTER — Other Ambulatory Visit: Payer: Self-pay

## 2021-04-05 ENCOUNTER — Inpatient Hospital Stay: Payer: Medicare HMO

## 2021-04-05 ENCOUNTER — Encounter: Payer: Self-pay | Admitting: Hematology and Oncology

## 2021-04-05 ENCOUNTER — Inpatient Hospital Stay: Payer: Medicare HMO | Attending: Hematology and Oncology | Admitting: Hematology and Oncology

## 2021-04-05 DIAGNOSIS — D539 Nutritional anemia, unspecified: Secondary | ICD-10-CM

## 2021-04-05 DIAGNOSIS — R5383 Other fatigue: Secondary | ICD-10-CM | POA: Diagnosis not present

## 2021-04-05 DIAGNOSIS — Z79899 Other long term (current) drug therapy: Secondary | ICD-10-CM | POA: Insufficient documentation

## 2021-04-05 DIAGNOSIS — D500 Iron deficiency anemia secondary to blood loss (chronic): Secondary | ICD-10-CM

## 2021-04-05 DIAGNOSIS — Z7951 Long term (current) use of inhaled steroids: Secondary | ICD-10-CM | POA: Diagnosis not present

## 2021-04-05 DIAGNOSIS — K5730 Diverticulosis of large intestine without perforation or abscess without bleeding: Secondary | ICD-10-CM | POA: Diagnosis not present

## 2021-04-05 DIAGNOSIS — D508 Other iron deficiency anemias: Secondary | ICD-10-CM | POA: Diagnosis present

## 2021-04-05 DIAGNOSIS — Z7984 Long term (current) use of oral hypoglycemic drugs: Secondary | ICD-10-CM | POA: Insufficient documentation

## 2021-04-05 DIAGNOSIS — Z7982 Long term (current) use of aspirin: Secondary | ICD-10-CM | POA: Diagnosis not present

## 2021-04-05 LAB — CBC WITH DIFFERENTIAL/PLATELET
Abs Immature Granulocytes: 0.01 10*3/uL (ref 0.00–0.07)
Basophils Absolute: 0 10*3/uL (ref 0.0–0.1)
Basophils Relative: 1 %
Eosinophils Absolute: 0.1 10*3/uL (ref 0.0–0.5)
Eosinophils Relative: 2 %
HCT: 39.6 % (ref 36.0–46.0)
Hemoglobin: 13.1 g/dL (ref 12.0–15.0)
Immature Granulocytes: 0 %
Lymphocytes Relative: 26 %
Lymphs Abs: 1.4 10*3/uL (ref 0.7–4.0)
MCH: 28.9 pg (ref 26.0–34.0)
MCHC: 33.1 g/dL (ref 30.0–36.0)
MCV: 87.2 fL (ref 80.0–100.0)
Monocytes Absolute: 0.4 10*3/uL (ref 0.1–1.0)
Monocytes Relative: 8 %
Neutro Abs: 3.3 10*3/uL (ref 1.7–7.7)
Neutrophils Relative %: 63 %
Platelets: 215 10*3/uL (ref 150–400)
RBC: 4.54 MIL/uL (ref 3.87–5.11)
RDW: 14.4 % (ref 11.5–15.5)
WBC: 5.2 10*3/uL (ref 4.0–10.5)
nRBC: 0 % (ref 0.0–0.2)

## 2021-04-05 LAB — IRON AND TIBC
Iron: 73 ug/dL (ref 41–142)
Saturation Ratios: 20 % — ABNORMAL LOW (ref 21–57)
TIBC: 373 ug/dL (ref 236–444)
UIBC: 299 ug/dL (ref 120–384)

## 2021-04-05 LAB — FERRITIN: Ferritin: 36 ng/mL (ref 11–307)

## 2021-04-05 NOTE — Assessment & Plan Note (Signed)
After significant IV iron infusion, she is not anemic Her iron studies are adequate I plan to see her again in 6 months for further follow-up

## 2021-04-05 NOTE — Telephone Encounter (Signed)
Called and left below message. Ask her to call the office back for questions. 

## 2021-04-05 NOTE — Telephone Encounter (Signed)
-----   Message from [name], MD sent at 04/05/2021 11:36 AM EDT ----- Pls call her, iron studies ok See her next year as scheduled

## 2021-04-05 NOTE — Assessment & Plan Note (Signed)
This cause of her fatigue is multifactorial With resolution of anemia it is not the cause of her fatigue On further questioning, the patient is sedentary and I suspect she is quite deconditioned I recommend gentle exercise as tolerated

## 2021-04-05 NOTE — Progress Notes (Signed)
[place] OFFICE PROGRESS NOTE  Brittany Evans., FNP  ASSESSMENT & PLAN:  Iron deficiency anemia due to chronic blood loss After significant IV iron infusion, she is not anemic Her iron studies are adequate I plan to see her again in 6 months for further follow-up  Other fatigue This cause of her fatigue is multifactorial With resolution of anemia it is not the cause of her fatigue On further questioning, the patient is sedentary and I suspect she is quite deconditioned I recommend gentle exercise as tolerated  No orders of the defined types were placed in this encounter.   The total time spent in the appointment was 20 minutes encounter with patients including review of chart and various tests results, discussions about plan of care and coordination of care plan   All questions were answered. The patient knows to call the clinic with any problems, questions or concerns. No barriers to learning was detected.    [name], MD 8/19/202212:58 PM  INTERVAL HISTORY: Brittany Evans 65 y.o. female returns for further follow-up She complained of excessive fatigue She denies recent bleeding Her appetite is fair No recent bowel habit changes  SUMMARY OF HEMATOLOGIC HISTORY:  She was found to have abnormal CBC from recent blood work I have the opportunity to review her CBC dated back to 12/09/2007 Her prior hemoglobin has been normal or as high as 15.4 noted on 03/11/2013 Starting on 08/07/2020, she was noted to be anemic with hemoglobin of 12.2.  She presented to the emergency room with rectal bleeding and was referred to GI service for evaluation Her most recent hemoglobin on 10/11/2020 showed hemoglobin of 8, MCV of 73.4  She denies recent chest pain on exertion but had recent shortness of breath on minimal exertion, several syncopal episodes leading to sprained ankle and palpitations. She had not noticed any recent bleeding such as epistaxis, hematuria or  hemoptysis.  She had her last menstruation was 4 years ago.  She has no further postmenopausal bleeding after D&C.  Result of her D&C was normal  The patient denies over the counter NSAID ingestion. She is on antiplatelets agent with aspirin for secondary prevention. Her last colonoscopy was on 10/17/2020. Report showed  - Diverticulosis in the sigmoid colon, in the descending colon and in the transverse colon. This may explain 2 episodes of hematochezia, but not iron deficiency. - The examination was otherwise normal on direct and retroflexion views. - No specimens collected.  EGD on 10/17/2020 showed  - Normal esophagus. - A single gastric polyp. Resected and retrieved. - Normal examined duodenum. Biopsied. - Biopsies were taken with a cold forceps for histology and Helicobacter pylori testing.  She had no prior history or diagnosis of cancer. Her age appropriate screening programs are up-to-date. She has pica with ice craving and eats a variety of diet. She never donated blood or received blood transfusion.  This is due to her religious belief being Jehovah witness The patient was prescribed oral iron supplements and she takes daily without benefit so far From 11/02/2020 to 11/16/2020, she received 3 doses of intravenous iron sucrose 300 mg dose  I have reviewed the past medical history, past surgical history, social history and family history with the patient and they are unchanged from previous note.  ALLERGIES:  is allergic to trulicity [dulaglutide].  MEDICATIONS:  Current Outpatient Medications  Medication Sig Dispense Refill   ADVAIR HFA 115-21 MCG/ACT inhaler Inhale 2 puffs into the lungs 2 (two) times  daily as needed (sob and wheezing).     albuterol (VENTOLIN HFA) 108 (90 Base) MCG/ACT inhaler Inhale 2 puffs into the lungs every 6 (six) hours as needed for wheezing or shortness of breath.     aspirin 81 MG tablet Take 1 tablet (81 mg total) by mouth daily. 30 tablet 3   glipiZIDE  (GLUCOTROL) 10 MG tablet Take 10 mg by mouth daily before breakfast.     lisinopril (ZESTRIL) 10 MG tablet Take 10 mg by mouth daily.      metFORMIN (GLUCOPHAGE-XR) 500 MG 24 hr tablet Take 500 mg by mouth daily.     montelukast (SINGULAIR) 10 MG tablet Take 1 tablet by mouth at bedtime.     Multiple Vitamin (MULTI-VITAMINS) TABS TAKE 1 TABLET BY MOUTH ONCE DAILY 30 tablet 3   nitroGLYCERIN (NITROSTAT) 0.4 MG SL tablet Place 1 tablet (0.4 mg total) under the tongue every 5 (five) minutes as needed. If you require more than two tablets five minutes apart call 911 and go to nearest ER via EMS. 10 tablet 0   NYAMYC powder Apply 1 Units topically 2 (two) times daily.     simvastatin (ZOCOR) 40 MG tablet Take 1 tablet (40 mg total) by mouth at bedtime. 90 tablet 3   spironolactone (ALDACTONE) 50 MG tablet Take 50 mg by mouth daily.     No current facility-administered medications for this visit.     REVIEW OF SYSTEMS:   Constitutional: Denies fevers, chills or night sweats Eyes: Denies blurriness of vision Ears, nose, mouth, throat, and face: Denies mucositis or sore throat Respiratory: Denies cough, dyspnea or wheezes Cardiovascular: Denies palpitation, chest discomfort or lower extremity swelling Gastrointestinal:  Denies nausea, heartburn or change in bowel habits Skin: Denies abnormal skin rashes Lymphatics: Denies new lymphadenopathy or easy bruising Neurological:Denies numbness, tingling or new weaknesses Behavioral/Psych: Mood is stable, no new changes  All other systems were reviewed with the patient and are negative.  PHYSICAL EXAMINATION: ECOG PERFORMANCE STATUS: 1 - Symptomatic but completely ambulatory  Vitals:   04/05/21 0910  BP: 121/75  Pulse: 66  Resp: 18  Temp: 98 F (36.7 C)  SpO2: 97%   Filed Weights   04/05/21 0910  Weight: 218 lb 12.8 oz (99.2 kg)    GENERAL:alert, no distress and comfortable NEURO: alert & oriented x 3 with fluent speech, no focal  motor/sensory deficits  LABORATORY DATA:  I have reviewed the data as listed     Component Value Date/Time   NA 138 01/04/2021 0924   K 4.2 01/04/2021 0924   CL 107 01/04/2021 0924   CO2 21 (L) 01/04/2021 0924   GLUCOSE 229 (H) 01/04/2021 0924   BUN 26 (H) 01/04/2021 0924   CREATININE 1.77 (H) 01/04/2021 0924   CREATININE 1.23 (H) 04/29/2013 0946   CALCIUM 10.0 01/04/2021 0924   PROT 7.4 01/04/2021 0924   ALBUMIN 3.9 01/04/2021 0924   AST 18 01/04/2021 0924   ALT 16 01/04/2021 0924   ALKPHOS 82 01/04/2021 0924   BILITOT 0.7 01/04/2021 0924   GFRNONAA 32 (L) 01/04/2021 0924   GFRNONAA 48 (L) 03/18/2013 1050   GFRAA 50 (L) 04/11/2015 1000   GFRAA 56 (L) 03/18/2013 1050    No results found for: SPEP, UPEP  Lab Results  Component Value Date   WBC 5.2 04/05/2021   NEUTROABS 3.3 04/05/2021   HGB 13.1 04/05/2021   HCT 39.6 04/05/2021   MCV 87.2 04/05/2021   PLT 215 04/05/2021  Chemistry      Component Value Date/Time   NA 138 01/04/2021 0924   K 4.2 01/04/2021 0924   CL 107 01/04/2021 0924   CO2 21 (L) 01/04/2021 0924   BUN 26 (H) 01/04/2021 0924   CREATININE 1.77 (H) 01/04/2021 0924   CREATININE 1.23 (H) 04/29/2013 0946      Component Value Date/Time   CALCIUM 10.0 01/04/2021 0924   ALKPHOS 82 01/04/2021 0924   AST 18 01/04/2021 0924   ALT 16 01/04/2021 0924   BILITOT 0.7 01/04/2021 0924       RADIOGRAPHIC STUDIES: I have personally reviewed the radiological images as listed and agreed with the findings in the report. No results found.

## 2021-07-19 ENCOUNTER — Ambulatory Visit: Payer: Medicare HMO | Admitting: Cardiology

## 2021-07-19 ENCOUNTER — Encounter: Payer: Self-pay | Admitting: Cardiology

## 2021-07-19 ENCOUNTER — Other Ambulatory Visit: Payer: Self-pay

## 2021-07-19 VITALS — BP 136/81 | HR 60 | Resp 16 | Ht 65.0 in | Wt 221.4 lb

## 2021-07-19 DIAGNOSIS — E782 Mixed hyperlipidemia: Secondary | ICD-10-CM

## 2021-07-19 DIAGNOSIS — R001 Bradycardia, unspecified: Secondary | ICD-10-CM

## 2021-07-19 DIAGNOSIS — Z6836 Body mass index (BMI) 36.0-36.9, adult: Secondary | ICD-10-CM

## 2021-07-19 DIAGNOSIS — I2510 Atherosclerotic heart disease of native coronary artery without angina pectoris: Secondary | ICD-10-CM

## 2021-07-19 DIAGNOSIS — I10 Essential (primary) hypertension: Secondary | ICD-10-CM

## 2021-07-19 DIAGNOSIS — E119 Type 2 diabetes mellitus without complications: Secondary | ICD-10-CM

## 2021-07-19 DIAGNOSIS — R9431 Abnormal electrocardiogram [ECG] [EKG]: Secondary | ICD-10-CM

## 2021-07-19 NOTE — Progress Notes (Signed)
Brittany Evans Date of Birth: 01/15/1956 MRN: [id number] Primary Care Provider:Smith, Malva Limes., FNP Former Cardiology Providers: Jeri Lager, APRN, FNP-C  Primary Cardiologist: Rex Kras, DO, [facility] (established care 12/14/2019)  Date: 07/19/21 Last Office Visit: 01/17/2021  Chief Complaint  Patient presents with   Coronary Artery Disease   HPI  Brittany Evans is a 65 y.o.  female who presents to the office with a chief complaint of " 44-month follow-up for CAD management." Patient's past medical history and cardiovascular risk factors include: Established coronary artery disease, recovered cardiomyopathy, non-insulin-dependent diabetes mellitus type 2, Hypertension, hyperlipidemia, obesity due to excess calories, postmenopausal female.  Patient is being followed for the management of her underlying coronary artery disease and recovered cardiomyopathy.  Please last office visit patient states that she is doing well from a cardiovascular standpoint.  Her underlying anemia has been resolved and no longer requiring IV iron infusion as of August 2022.  She is also stopped taking metoprolol due to underlying bradycardia.  Patient denies orthopnea, paroxysmal nocturnal dyspnea lower extremity swelling.  Patient denies any chest pain or anginal equivalent at rest or with effort related activities.  No use of sublingual nitroglycerin tablets since last office visit.  Patient states that she had labs with her nephrologist in the recent past.  And she was informed that labs are within acceptable range.  I do not have those labs for review.  I have asked her to bring a copy of the most recent labs at the next office encounter to update records.  FUNCTIONAL STATUS: Limited, due to recent injury to the right lower leg.  ALLERGIES: Allergies  Allergen Reactions   Trulicity [Dulaglutide] Nausea And Vomiting   MEDICATION LIST PRIOR TO VISIT: Current Outpatient Medications on File Prior  to Visit  Medication Sig Dispense Refill   ADVAIR HFA 115-21 MCG/ACT inhaler Inhale 2 puffs into the lungs 2 (two) times daily as needed (sob and wheezing).     albuterol (VENTOLIN HFA) 108 (90 Base) MCG/ACT inhaler Inhale 2 puffs into the lungs every 6 (six) hours as needed for wheezing or shortness of breath.     aspirin 81 MG tablet Take 1 tablet (81 mg total) by mouth daily. 30 tablet 3   glipiZIDE (GLUCOTROL) 10 MG tablet Take 10 mg by mouth daily before breakfast.     lisinopril (ZESTRIL) 10 MG tablet Take 10 mg by mouth daily.      metFORMIN (GLUCOPHAGE-XR) 500 MG 24 hr tablet Take 500 mg by mouth daily.     montelukast (SINGULAIR) 10 MG tablet Take 1 tablet by mouth at bedtime.     Multiple Vitamin (MULTI-VITAMINS) TABS TAKE 1 TABLET BY MOUTH ONCE DAILY 30 tablet 3   nitroGLYCERIN (NITROSTAT) 0.4 MG SL tablet Place 1 tablet (0.4 mg total) under the tongue every 5 (five) minutes as needed. If you require more than two tablets five minutes apart call 911 and go to nearest ER via EMS. 10 tablet 0   NYAMYC powder Apply 1 Units topically 2 (two) times daily.     simvastatin (ZOCOR) 40 MG tablet Take 1 tablet (40 mg total) by mouth at bedtime. 90 tablet 3   spironolactone (ALDACTONE) 50 MG tablet Take 50 mg by mouth daily.     No current facility-administered medications on file prior to visit.    PAST MEDICAL HISTORY: Past Medical History:  Diagnosis Date   Allergy    Anemia    low iron- has appt to see  MD   Asthma    inhaler use as needed rarely   CAD (coronary artery disease)    NSTEMI 2/12: LAD 30%, D1 40%, oCFX 70-80%, mRCA 10-20%, ?occl. of apical LAD; treated medically   Cataract    CHF (congestive heart failure) ([place])    Chronic kidney disease    stage 3 chronic kidney disease- pt states she is asymptomatic   Complication of anesthesia    Lightheaded for several days   Diabetes mellitus    DVT (deep venous thrombosis) (H[facility])    left leg x2   Dysrhythmia     Hyperlipidemia    Hypertension    Myocardial infarction ([place])    Neuromuscular disorder (H[facility])    neuropathy due to DM   NICM (nonischemic cardiomyopathy) ([place])    EF 20-25%, 45% 2012   Obesity    Peripheral vascular disease ([place])    Systolic CHF ([place])     PAST SURGICAL HISTORY: Past Surgical History:  Procedure Laterality Date   COLONOSCOPY     DILATATION & CURETTAGE/HYSTEROSCOPY WITH MYOSURE N/A 07/18/2014   Procedure: DILATATION & CURETTAGE/HYSTEROSCOPY WITH MYOSURE;  Surgeon: Woodroe Mode, MD;  Location: [facility];  Service: Gynecology;  Laterality: N/A;   DILATATION & CURRETTAGE/HYSTEROSCOPY WITH RESECTOCOPE N/A 04/12/2015   Procedure:  DIAGNOSTIC HYSTEROSCOPY WITH RESECTION OF FIBROID AND POLYPS;  Surgeon: Servando Salina, MD;  Location: [facility];  Service: Gynecology;  Laterality: N/A;   DILATION AND CURETTAGE OF UTERUS N/A 04/12/2015   Procedure: DILATATION AND CURETTAGE;  Surgeon: Servando Salina, MD;  Location: [facility];  Service: Gynecology;  Laterality: N/A;   None     POLYPECTOMY N/A 07/18/2014   Procedure: Vaginal POLYPECTOMY;  Surgeon: Woodroe Mode, MD;  Location: [facility];  Service: Gynecology;  Laterality: N/A;   WISDOM TOOTH EXTRACTION      FAMILY HISTORY: The patient's family history includes Alcohol abuse in her father; Breast cancer in her cousin; Cancer in her mother and sister; Cirrhosis in her father; Coronary artery disease in her mother; Diabetes in her mother; Heart disease in her father and mother; Lung disease in her sister; Pancreatic cancer in her maternal grandmother; Rectal cancer in her mother.   SOCIAL HISTORY:  The patient  reports that she has never smoked. She has never used smokeless tobacco. She reports that she does not drink alcohol and does not use drugs.  Review of Systems  Constitutional: Negative for chills and fever.  HENT:  Negative for hoarse voice and nosebleeds.   Eyes:  Negative for discharge, double vision and pain.   Cardiovascular:  Negative for chest pain, claudication, dyspnea on exertion, leg swelling, near-syncope, orthopnea, palpitations, paroxysmal nocturnal dyspnea and syncope.  Respiratory:  Negative for hemoptysis and shortness of breath.   Musculoskeletal:  Negative for muscle cramps and myalgias.  Gastrointestinal:  Negative for abdominal pain, constipation, diarrhea, hematemesis, hematochezia, melena, nausea and vomiting.  Neurological:  Negative for dizziness and light-headedness.   PHYSICAL EXAM: Vitals with BMI 07/19/2021 07/19/2021 04/05/2021  Height - 5\' 5"  5\' 5"   Weight - 221 lbs 6 oz 218 lbs 13 oz  BMI - 79.15 05.69  Systolic 794 801 655  Diastolic 81 83 75  Pulse 60 62 66   CONSTITUTIONAL: Well-developed and well-nourished. No acute distress.  SKIN: Skin is warm and dry. No rash noted. No cyanosis. No pallor. No jaundice HEAD: Normocephalic and atraumatic.  EYES: No scleral icterus MOUTH/THROAT: Moist oral membranes.  NECK: No JVD present. No thyromegaly noted. No  carotid bruits  LYMPHATIC: No visible cervical adenopathy.  CHEST Normal respiratory effort. No intercostal retractions  LUNGS: Clear to auscultation bilaterally.  No stridor. No wheezes. No rales.  CARDIOVASCULAR: Regular rate and rhythm, positive S1-S2, no murmurs rubs or gallops appreciated. ABDOMINAL: Obese, soft, nontender, nondistended, positive bowel sounds all 4 quadrants.  No apparent ascites.  EXTREMITIES: No peripheral edema. Brace on the right lower leg.  HEMATOLOGIC: No significant bruising NEUROLOGIC: Oriented to person, place, and time. Nonfocal. Normal muscle tone.  PSYCHIATRIC: Normal mood and affect. Normal behavior. Cooperative  CARDIAC DATABASE: EKG: 07/19/2021: Sinus bradycardia, 58 bpm, nonspecific T wave abnormality.    Echocardiogram: 07/24/2020: Left ventricle cavity is normal in size. Mild concentric hypertrophy of the left ventricle. Normal global wall motion. LVEF 55-60%. Doppler  evidence of grade I (impaired) diastolic dysfunction, normal LAP. Calculated EF 60%. Left atrial cavity is mildly dilated. Mild (Grade I) mitral regurgitation. Mild tricuspid regurgitation. No evidence of pulmonary hypertension. No significant change compared to previous study on 09/02/2017.  Stress Testing:  Lexiscan (Walking with mod Bruce) Sestamibi Stress Test 06/25/2020: Non-diagnostic ECG stress. Mild soft tissue attenuation in inferior wall. A small sized reversible mild defect in the inferior region cannot be excluded.  The LV is normal in size in both rest and stress images. Overall LV systolic function is abnormal without regional wall motion abnormalities. Global hypokinesis of the left ventricle. Stress LV EF: 39%. Findings may represent non ischemic cardiomyopathy.  No previous exam available for comparison. Intermediate risk due to low LVEF. Marland Kitchen Clinical correlation recommended.   Heart Catheterization: Coronary angiogram 2/12: LAD 30%, D1 40%, oCFX 70-80%, mRCA 10-20%, ?occl. of apical LAD; Medical therapy. LVEF 30%  LABORATORY DATA: CBC Latest Ref Rng & Units 04/05/2021 01/04/2021 10/11/2020  WBC 4.0 - 10.5 K/uL 5.2 4.2 4.8  Hemoglobin 12.0 - 15.0 g/dL 13.1 13.0 8.0 Repeated and verified X2.(LL)  Hematocrit 36.0 - 46.0 % 39.6 40.2 25.2 Repeated and verified X2.(L)  Platelets 150 - 400 K/uL 215 203 219.0    CMP Latest Ref Rng & Units 01/04/2021 08/07/2020 04/11/2015  Glucose 70 - 99 mg/dL 229(H) 157(H) 112(H)  BUN 8 - 23 mg/dL 26(H) 29(H) 22(H)  Creatinine 0.44 - 1.00 mg/dL 1.77(H) 1.47(H) 1.34(H)  Sodium 135 - 145 mmol/L 138 138 139  Potassium 3.5 - 5.1 mmol/L 4.2 4.2 4.0  Chloride 98 - 111 mmol/L 107 105 107  CO2 22 - 32 mmol/L 21(L) 23 23  Calcium 8.9 - 10.3 mg/dL 10.0 9.5 9.8  Total Protein 6.5 - 8.1 g/dL 7.4 7.1 -  Total Bilirubin 0.3 - 1.2 mg/dL 0.7 0.7 -  Alkaline Phos 38 - 126 U/L 82 54 -  AST 15 - 41 U/L 18 18 -  ALT 0 - 44 U/L 16 22 -    Lipid Panel      Component Value Date/Time   CHOL 130 06/14/2013 1357   TRIG 108 06/14/2013 1357   HDL 50 06/14/2013 1357   CHOLHDL 2.6 06/14/2013 1357   VLDL 22 06/14/2013 1357   LDLCALC 58 06/14/2013 1357    Lab Results  Component Value Date   HGBA1C 7.2 01/02/2014   HGBA1C 7.4 09/29/2013   HGBA1C 7.4 06/14/2013   No components found for: NTPROBNP Lab Results  Component Value Date   TSH 1.991 03/11/2013   TSH 1.602 05/03/2011   TSH 1.962 10/02/2010    Cardiac Panel (last 3 results) No results for input(s): CKTOTAL, CKMB, TROPONINIHS, RELINDX in the last 72 hours.  FINAL MEDICATION LIST END OF ENCOUNTER: No orders of the defined types were placed in this encounter.     Current Outpatient Medications:    ADVAIR HFA 115-21 MCG/ACT inhaler, Inhale 2 puffs into the lungs 2 (two) times daily as needed (sob and wheezing)., Disp: , Rfl:    albuterol (VENTOLIN HFA) 108 (90 Base) MCG/ACT inhaler, Inhale 2 puffs into the lungs every 6 (six) hours as needed for wheezing or shortness of breath., Disp: , Rfl:    aspirin 81 MG tablet, Take 1 tablet (81 mg total) by mouth daily., Disp: 30 tablet, Rfl: 3   glipiZIDE (GLUCOTROL) 10 MG tablet, Take 10 mg by mouth daily before breakfast., Disp: , Rfl:    lisinopril (ZESTRIL) 10 MG tablet, Take 10 mg by mouth daily. , Disp: , Rfl:    metFORMIN (GLUCOPHAGE-XR) 500 MG 24 hr tablet, Take 500 mg by mouth daily., Disp: , Rfl:    montelukast (SINGULAIR) 10 MG tablet, Take 1 tablet by mouth at bedtime., Disp: , Rfl:    Multiple Vitamin (MULTI-VITAMINS) TABS, TAKE 1 TABLET BY MOUTH ONCE DAILY, Disp: 30 tablet, Rfl: 3   nitroGLYCERIN (NITROSTAT) 0.4 MG SL tablet, Place 1 tablet (0.4 mg total) under the tongue every 5 (five) minutes as needed. If you require more than two tablets five minutes apart call 911 and go to nearest ER via EMS., Disp: 10 tablet, Rfl: 0   NYAMYC powder, Apply 1 Units topically 2 (two) times daily., Disp: , Rfl:    simvastatin (ZOCOR) 40 MG  tablet, Take 1 tablet (40 mg total) by mouth at bedtime., Disp: 90 tablet, Rfl: 3   spironolactone (ALDACTONE) 50 MG tablet, Take 50 mg by mouth daily., Disp: , Rfl:   IMPRESSION:    ICD-10-CM   1. Atherosclerosis of native coronary artery of native heart without angina pectoris  I25.10 EKG 12-Lead    2. Bradycardia  R00.1     3. Nonspecific abnormal electrocardiogram (ECG) (EKG)  R94.31     4. Benign hypertension  I10     5. Mixed hyperlipidemia  E78.2     6. Non-insulin dependent type 2 diabetes mellitus ([place])  E11.9     7. Class 2 severe obesity due to excess calories with serious comorbidity and body mass index (BMI) of 36.0 to 36.9 in adult [facility])  E66.01    Z68.36        RECOMMENDATIONS: Brittany Evans is a 65 y.o. female whose past medical history and cardiovascular risk factors include: Established coronary artery disease, recovered cardiomyopathy, non-insulin-dependent diabetes mellitus type 2, Hypertension, hyperlipidemia, obesity due to excess calories, postmenopausal female.  Established coronary artery disease without angina pectoris: Stable Chronic and stable. Denies any angina pectoris/equivalents. No use of sublingual nitroglycerin tablets since last office encounter. Medications reconciled. Patient states that she had recent labs with her nephrologist-she is asked to bring the most recent labs in at the next office visit. Reviewed the most recent echo and stress test results as part of today's office visit. Reeducated on the importance of working on her modifiable cardiovascular risk factors which include weight loss, glycemic control, and lipid management.  Bradycardia: Asymptomatic.  Recovered cardiomyopathy: Continue guideline directed medical therapy.  Benign essential hypertension:  Currently at goal.   Currently managed by nephrology.   Low-salt diet recommended.   Non-insulin-dependent diabetes mellitus type 2:  Continue lisinopril, statin  therapy. May consider SGLT2 inhibitors-will defer to primary team. Educated in the importance of glycemic control given her  history of CAD.  Obesity, due to excess calories: Body mass index is 36.84 kg/m. I reviewed with the patient the importance of diet, regular physical activity/exercise, weight loss.   Patient is educated on increasing physical activity gradually as tolerated.  With the goal of moderate intensity exercise for 30 minutes a day 5 days a week.  Orders Placed This Encounter  Procedures   EKG 12-Lead   --Continue cardiac medications as reconciled in final medication list. --Return in about 6 months (around 01/17/2022) for Follow up, CAD. Or sooner if needed. --Continue follow-up with your primary care physician regarding the management of your other chronic comorbid conditions.  Patient's questions and concerns were addressed to her satisfaction. She voices understanding of the instructions provided during this encounter.   This note was created using a voice recognition software as a result there may be grammatical errors inadvertently enclosed that do not reflect the nature of this encounter. Every attempt is made to correct such errors.  Total time spent 30 minutes.  Rex Kras, [place], [facility]  Pager: [phone] Office: [phone]

## 2021-10-07 ENCOUNTER — Inpatient Hospital Stay: Payer: Medicare HMO

## 2021-10-07 ENCOUNTER — Inpatient Hospital Stay: Payer: Medicare HMO | Attending: Hematology and Oncology | Admitting: Hematology and Oncology

## 2021-10-07 ENCOUNTER — Other Ambulatory Visit: Payer: Self-pay

## 2021-10-07 ENCOUNTER — Other Ambulatory Visit: Payer: Self-pay | Admitting: Hematology and Oncology

## 2021-10-07 ENCOUNTER — Telehealth: Payer: Self-pay

## 2021-10-07 ENCOUNTER — Encounter: Payer: Self-pay | Admitting: Hematology and Oncology

## 2021-10-07 VITALS — BP 134/65 | HR 60 | Temp 98.0°F | Resp 18 | Ht 65.0 in | Wt 223.8 lb

## 2021-10-07 DIAGNOSIS — Z79899 Other long term (current) drug therapy: Secondary | ICD-10-CM | POA: Insufficient documentation

## 2021-10-07 DIAGNOSIS — D509 Iron deficiency anemia, unspecified: Secondary | ICD-10-CM | POA: Diagnosis not present

## 2021-10-07 DIAGNOSIS — Z7982 Long term (current) use of aspirin: Secondary | ICD-10-CM | POA: Insufficient documentation

## 2021-10-07 DIAGNOSIS — D500 Iron deficiency anemia secondary to blood loss (chronic): Secondary | ICD-10-CM

## 2021-10-07 DIAGNOSIS — Z7984 Long term (current) use of oral hypoglycemic drugs: Secondary | ICD-10-CM | POA: Insufficient documentation

## 2021-10-07 DIAGNOSIS — D539 Nutritional anemia, unspecified: Secondary | ICD-10-CM

## 2021-10-07 LAB — CBC WITH DIFFERENTIAL/PLATELET
Abs Immature Granulocytes: 0.02 10*3/uL (ref 0.00–0.07)
Basophils Absolute: 0 10*3/uL (ref 0.0–0.1)
Basophils Relative: 1 %
Eosinophils Absolute: 0.1 10*3/uL (ref 0.0–0.5)
Eosinophils Relative: 1 %
HCT: 41.5 % (ref 36.0–46.0)
Hemoglobin: 13.9 g/dL (ref 12.0–15.0)
Immature Granulocytes: 0 %
Lymphocytes Relative: 22 %
Lymphs Abs: 1.2 10*3/uL (ref 0.7–4.0)
MCH: 29.6 pg (ref 26.0–34.0)
MCHC: 33.5 g/dL (ref 30.0–36.0)
MCV: 88.3 fL (ref 80.0–100.0)
Monocytes Absolute: 0.3 10*3/uL (ref 0.1–1.0)
Monocytes Relative: 6 %
Neutro Abs: 3.8 10*3/uL (ref 1.7–7.7)
Neutrophils Relative %: 70 %
Platelets: 131 10*3/uL — ABNORMAL LOW (ref 150–400)
RBC: 4.7 MIL/uL (ref 3.87–5.11)
RDW: 12.7 % (ref 11.5–15.5)
WBC: 5.4 10*3/uL (ref 4.0–10.5)
nRBC: 0 % (ref 0.0–0.2)

## 2021-10-07 LAB — FERRITIN: Ferritin: 72 ng/mL (ref 11–307)

## 2021-10-07 LAB — IRON AND IRON BINDING CAPACITY (CC-WL,HP ONLY)
Iron: 84 ug/dL (ref 28–170)
Saturation Ratios: 20 % (ref 10.4–31.8)
TIBC: 417 ug/dL (ref 250–450)
UIBC: 333 ug/dL (ref 148–442)

## 2021-10-07 NOTE — Telephone Encounter (Signed)
-----   Message from [name], MD sent at 10/07/2021  1:45 PM EST ----- Pls call and let her know iron studies are better!

## 2021-10-07 NOTE — Assessment & Plan Note (Signed)
After significant IV iron infusion, she is not anemic Her iron studies are adequate I plan to see her again in 6 months for further follow-up If her next appointment is still within normal range, I will discontinue long-term follow-up

## 2021-10-07 NOTE — Progress Notes (Signed)
[facility] OFFICE PROGRESS NOTE  Brittany Evans., FNP  ASSESSMENT & PLAN:  Iron deficiency anemia due to chronic blood loss After significant IV iron infusion, she is not anemic Her iron studies are adequate I plan to see her again in 6 months for further follow-up If her next appointment is still within normal range, I will discontinue long-term follow-up  Orders Placed This Encounter  Procedures   Iron and Iron Binding Capacity ([facility] only)    Standing Status:   Future    Standing Expiration Date:   10/07/2022   Ferritin    Standing Status:   Future    Standing Expiration Date:   10/07/2022    The total time spent in the appointment was 20 minutes encounter with patients including review of chart and various tests results, discussions about plan of care and coordination of care plan   All questions were answered. The patient knows to call the clinic with any problems, questions or concerns. No barriers to learning was detected.    [name], MD 2/20/20232:53 PM  INTERVAL HISTORY: Brittany Evans 66 y.o. female returns for follow-up on history of iron deficiency anemia She is doing well The patient denies any recent signs or symptoms of bleeding such as spontaneous epistaxis, hematuria or hematochezia. No recent pica  SUMMARY OF HEMATOLOGIC HISTORY:  She was found to have abnormal CBC from recent blood work I have the opportunity to review her CBC dated back to 12/09/2007 Her prior hemoglobin has been normal or as high as 15.4 noted on 03/11/2013 Starting on 08/07/2020, she was noted to be anemic with hemoglobin of 12.2.  She presented to the emergency room with rectal bleeding and was referred to GI service for evaluation Her most recent hemoglobin on 10/11/2020 showed hemoglobin of 8, MCV of 73.4  She denies recent chest pain on exertion but had recent shortness of breath on minimal exertion, several syncopal episodes leading to sprained ankle  and palpitations. She had not noticed any recent bleeding such as epistaxis, hematuria or hemoptysis.  She had her last menstruation was 4 years ago.  She has no further postmenopausal bleeding after D&C.  Result of her D&C was normal  The patient denies over the counter NSAID ingestion. She is on antiplatelets agent with aspirin for secondary prevention. Her last colonoscopy was on 10/17/2020. Report showed  - Diverticulosis in the sigmoid colon, in the descending colon and in the transverse colon. This may explain 2 episodes of hematochezia, but not iron deficiency. - The examination was otherwise normal on direct and retroflexion views. - No specimens collected.  EGD on 10/17/2020 showed  - Normal esophagus. - A single gastric polyp. Resected and retrieved. - Normal examined duodenum. Biopsied. - Biopsies were taken with a cold forceps for histology and Helicobacter pylori testing.  She had no prior history or diagnosis of cancer. Her age appropriate screening programs are up-to-date. She has pica with ice craving and eats a variety of diet. She never donated blood or received blood transfusion.  This is due to her religious belief being Jehovah witness The patient was prescribed oral iron supplements and she takes daily without benefit so far From 11/02/2020 to 11/16/2020, she received 3 doses of intravenous iron sucrose 300 mg dose   I have reviewed the past medical history, past surgical history, social history and family history with the patient and they are unchanged from previous note.  ALLERGIES:  is allergic to trulicity [dulaglutide].  MEDICATIONS:  Current Outpatient Medications  Medication Sig Dispense Refill   ADVAIR HFA 115-21 MCG/ACT inhaler Inhale 2 puffs into the lungs 2 (two) times daily as needed (sob and wheezing).     albuterol (VENTOLIN HFA) 108 (90 Base) MCG/ACT inhaler Inhale 2 puffs into the lungs every 6 (six) hours as needed for wheezing or shortness of breath.      aspirin 81 MG tablet Take 1 tablet (81 mg total) by mouth daily. 30 tablet 3   glipiZIDE (GLUCOTROL) 10 MG tablet Take 10 mg by mouth daily before breakfast.     lisinopril (ZESTRIL) 10 MG tablet Take 10 mg by mouth daily.      metFORMIN (GLUCOPHAGE-XR) 500 MG 24 hr tablet Take 500 mg by mouth daily.     montelukast (SINGULAIR) 10 MG tablet Take 1 tablet by mouth at bedtime.     Multiple Vitamin (MULTI-VITAMINS) TABS TAKE 1 TABLET BY MOUTH ONCE DAILY 30 tablet 3   nitroGLYCERIN (NITROSTAT) 0.4 MG SL tablet Place 1 tablet (0.4 mg total) under the tongue every 5 (five) minutes as needed. If you require more than two tablets five minutes apart call 911 and go to nearest ER via EMS. 10 tablet 0   NYAMYC powder Apply 1 Units topically 2 (two) times daily.     simvastatin (ZOCOR) 40 MG tablet Take 1 tablet (40 mg total) by mouth at bedtime. 90 tablet 3   spironolactone (ALDACTONE) 50 MG tablet Take 50 mg by mouth daily.     No current facility-administered medications for this visit.     REVIEW OF SYSTEMS:   Constitutional: Denies fevers, chills or night sweats Eyes: Denies blurriness of vision Ears, nose, mouth, throat, and face: Denies mucositis or sore throat Respiratory: Denies cough, dyspnea or wheezes Cardiovascular: Denies palpitation, chest discomfort or lower extremity swelling Gastrointestinal:  Denies nausea, heartburn or change in bowel habits Skin: Denies abnormal skin rashes Lymphatics: Denies new lymphadenopathy or easy bruising Neurological:Denies numbness, tingling or new weaknesses Behavioral/Psych: Mood is stable, no new changes  All other systems were reviewed with the patient and are negative.  PHYSICAL EXAMINATION: ECOG PERFORMANCE STATUS: 0 - Asymptomatic  Vitals:   10/07/21 1211  BP: 134/65  Pulse: 60  Resp: 18  Temp: 98 F (36.7 C)  SpO2: 100%   Filed Weights   10/07/21 1211  Weight: 223 lb 12.8 oz (101.5 kg)    GENERAL:alert, no distress and  comfortable  NEURO: alert & oriented x 3 with fluent speech, no focal motor/sensory deficits  LABORATORY DATA:  I have reviewed the data as listed     Component Value Date/Time   NA 138 01/04/2021 0924   K 4.2 01/04/2021 0924   CL 107 01/04/2021 0924   CO2 21 (L) 01/04/2021 0924   GLUCOSE 229 (H) 01/04/2021 0924   BUN 26 (H) 01/04/2021 0924   CREATININE 1.77 (H) 01/04/2021 0924   CREATININE 1.23 (H) 04/29/2013 0946   CALCIUM 10.0 01/04/2021 0924   PROT 7.4 01/04/2021 0924   ALBUMIN 3.9 01/04/2021 0924   AST 18 01/04/2021 0924   ALT 16 01/04/2021 0924   ALKPHOS 82 01/04/2021 0924   BILITOT 0.7 01/04/2021 0924   GFRNONAA 32 (L) 01/04/2021 0924   GFRNONAA 48 (L) 03/18/2013 1050   GFRAA 50 (L) 04/11/2015 1000   GFRAA 56 (L) 03/18/2013 1050    No results found for: SPEP, UPEP  Lab Results  Component Value Date   WBC 5.4 10/07/2021   NEUTROABS 3.8  10/07/2021   HGB 13.9 10/07/2021   HCT 41.5 10/07/2021   MCV 88.3 10/07/2021   PLT 131 (L) 10/07/2021      Chemistry      Component Value Date/Time   NA 138 01/04/2021 0924   K 4.2 01/04/2021 0924   CL 107 01/04/2021 0924   CO2 21 (L) 01/04/2021 0924   BUN 26 (H) 01/04/2021 0924   CREATININE 1.77 (H) 01/04/2021 0924   CREATININE 1.23 (H) 04/29/2013 0946      Component Value Date/Time   CALCIUM 10.0 01/04/2021 0924   ALKPHOS 82 01/04/2021 0924   AST 18 01/04/2021 0924   ALT 16 01/04/2021 0924   BILITOT 0.7 01/04/2021 2524

## 2021-10-07 NOTE — Telephone Encounter (Signed)
Called and left below message. Ask her to call the office back for questions. 

## 2021-11-15 ENCOUNTER — Ambulatory Visit
Admission: RE | Admit: 2021-11-15 | Discharge: 2021-11-15 | Disposition: A | Discharge status: Home / Self Care | Payer: Medicare HMO | Source: Ambulatory Visit | Attending: Family | Admitting: Family

## 2021-11-15 ENCOUNTER — Encounter: Payer: Self-pay | Admitting: Hematology and Oncology

## 2021-11-15 ENCOUNTER — Other Ambulatory Visit: Payer: Self-pay | Admitting: Family

## 2021-11-15 DIAGNOSIS — M79604 Pain in right leg: Secondary | ICD-10-CM

## 2021-11-15 DIAGNOSIS — M159 Polyosteoarthritis, unspecified: Secondary | ICD-10-CM

## 2022-01-16 ENCOUNTER — Ambulatory Visit: Payer: Medicare PPO | Admitting: Cardiology

## 2022-01-16 ENCOUNTER — Encounter: Payer: Self-pay | Admitting: Cardiology

## 2022-01-16 VITALS — BP 122/79 | HR 93 | Temp 98.2°F | Resp 16 | Ht 65.0 in | Wt 217.0 lb

## 2022-01-16 DIAGNOSIS — I429 Cardiomyopathy, unspecified: Secondary | ICD-10-CM

## 2022-01-16 DIAGNOSIS — E119 Type 2 diabetes mellitus without complications: Secondary | ICD-10-CM

## 2022-01-16 DIAGNOSIS — I10 Essential (primary) hypertension: Secondary | ICD-10-CM

## 2022-01-16 DIAGNOSIS — E782 Mixed hyperlipidemia: Secondary | ICD-10-CM

## 2022-01-16 DIAGNOSIS — I2510 Atherosclerotic heart disease of native coronary artery without angina pectoris: Secondary | ICD-10-CM

## 2022-01-16 NOTE — Progress Notes (Signed)
[name] Date of Birth: 01/19/1956 MRN: [id number] Primary Care Provider:Smith, Malva Limes., FNP Former Cardiology Providers: Jeri Lager, APRN, FNP-C  Primary Cardiologist: Rex Kras, DO, [facility] (established care 12/14/2019)  Date: 01/16/22 Last Office Visit: 07/19/2022  Chief Complaint  Patient presents with   Coronary Artery Disease   Follow-up   HPI  Brittany Evans is a 66 y.o.  female whose past medical history and cardiovascular risk factors include: Established coronary artery disease, recovered cardiomyopathy, non-insulin-dependent diabetes mellitus type 2, Hypertension, hyperlipidemia, obesity due to excess calories, postmenopausal female.  Patient has known history of CAD and recovered cardiomyopathy.  She presents today for 38-monthfollow-up visit.  No hospitalizations or urgent care visits for cardiovascular symptoms.  She denies angina pectoris or heart failure symptoms.  Patient is compliant with her cardiac medications.  She recently had labs at an outside facility in February 2023 records available in epic and reviewed independently and noted below for further reference.  Started walking at least 1 hour a day and enjoys doing so and does not have any exertional discomfort.  Focusing on lifestyle changes and has lost 4 pounds since last visit.  ALLERGIES: Allergies  Allergen Reactions   Trulicity [Dulaglutide] Nausea And Vomiting   MEDICATION LIST PRIOR TO VISIT: Current Outpatient Medications on File Prior to Visit  Medication Sig Dispense Refill   ADVAIR HFA 115-21 MCG/ACT inhaler Inhale 2 puffs into the lungs 2 (two) times daily as needed (sob and wheezing).     albuterol (VENTOLIN HFA) 108 (90 Base) MCG/ACT inhaler Inhale 2 puffs into the lungs every 6 (six) hours as needed for wheezing or shortness of breath.     aspirin 81 MG tablet Take 1 tablet (81 mg total) by mouth daily. 30 tablet 3   glipiZIDE (GLUCOTROL) 10 MG tablet Take 10 mg by mouth  daily before breakfast.     lisinopril (ZESTRIL) 10 MG tablet Take 10 mg by mouth daily.      metFORMIN (GLUCOPHAGE-XR) 500 MG 24 hr tablet Take 500 mg by mouth daily.     montelukast (SINGULAIR) 10 MG tablet Take 1 tablet by mouth at bedtime.     Multiple Vitamin (MULTI-VITAMINS) TABS TAKE 1 TABLET BY MOUTH ONCE DAILY 30 tablet 3   nitroGLYCERIN (NITROSTAT) 0.4 MG SL tablet Place 1 tablet (0.4 mg total) under the tongue every 5 (five) minutes as needed. If you require more than two tablets five minutes apart call 911 and go to nearest ER via EMS. 10 tablet 0   NYAMYC powder Apply 1 Units topically 2 (two) times daily.     simvastatin (ZOCOR) 40 MG tablet Take 1 tablet (40 mg total) by mouth at bedtime. 90 tablet 3   spironolactone (ALDACTONE) 50 MG tablet Take 50 mg by mouth daily.     No current facility-administered medications on file prior to visit.    PAST MEDICAL HISTORY: Past Medical History:  Diagnosis Date   Allergy    Anemia    low iron- has appt to see MD   Asthma    inhaler use as needed rarely   CAD (coronary artery disease)    NSTEMI 2/12: LAD 30%, D1 40%, oCFX 70-80%, mRCA 10-20%, ?occl. of apical LAD; treated medically   Cataract    CHF ([place]estive heart [place]) [place]    Chronic kidney disease    stage 3 chronic kidney disease- pt states she is asymptomatic   Complication of anesthesia    Lightheaded for several  days   Diabetes mellitus    DVT (deep venous thrombosis) (H[facility])    left leg x2   Dysrhythmia    Hyperlipidemia    Hypertension    Myocardial infarction [facility])    Neuromuscular disorder ([place])    neuropathy due to DM   NICM (nonischemic cardiomyopathy) ([place])    EF 20-25%, 45% 2012   Obesity    Peripheral vascular disease ([place])    Systolic CHF ([place])     PAST SURGICAL HISTORY: Past Surgical History:  Procedure Laterality Date   COLONOSCOPY     DILATATION & CURETTAGE/HYSTEROSCOPY WITH MYOSURE N/A 07/18/2014   Procedure: DILATATION &  CURETTAGE/HYSTEROSCOPY WITH MYOSURE;  Surgeon: Woodroe Mode, MD;  Location: [facility];  Service: Gynecology;  Laterality: N/A;   DILATATION & CURRETTAGE/HYSTEROSCOPY WITH RESECTOCOPE N/A 04/12/2015   Procedure:  DIAGNOSTIC HYSTEROSCOPY WITH RESECTION OF FIBROID AND POLYPS;  Surgeon: Servando Salina, MD;  Location: [facility];  Service: Gynecology;  Laterality: N/A;   DILATION AND CURETTAGE OF UTERUS N/A 04/12/2015   Procedure: DILATATION AND CURETTAGE;  Surgeon: Servando Salina, MD;  Location: [facility];  Service: Gynecology;  Laterality: N/A;   None     POLYPECTOMY N/A 07/18/2014   Procedure: Vaginal POLYPECTOMY;  Surgeon: Woodroe Mode, MD;  Location: [facility];  Service: Gynecology;  Laterality: N/A;   WISDOM TOOTH EXTRACTION      FAMILY HISTORY: The patient's family history includes Alcohol abuse in her father; Breast cancer in her cousin; Cancer in her mother and sister; Cirrhosis in her father; Coronary artery disease in her mother; Diabetes in her mother; Heart disease in her father and mother; Lung disease in her sister; Pancreatic cancer in her maternal grandmother; Rectal cancer in her mother.   SOCIAL HISTORY:  The patient  reports that she has never smoked. She has never used smokeless tobacco. She reports that she does not drink alcohol and does not use drugs.  Review of Systems  Cardiovascular:  Negative for chest pain, dyspnea on exertion, leg swelling, near-syncope, orthopnea, palpitations, paroxysmal nocturnal dyspnea and syncope.  Respiratory:  Negative for shortness of breath.    PHYSICAL EXAM:    01/16/2022   10:35 AM 10/07/2021   12:11 PM 07/19/2021    1:44 PM  Vitals with BMI  Height '5\' 5"'  '5\' 5"'    Weight 217 lbs 223 lbs 13 oz   BMI 47.65 46.50   Systolic 354 656 812  Diastolic 79 65 81  Pulse 93 60 60   CONSTITUTIONAL: Well-developed and well-nourished. No acute distress.  SKIN: Skin is warm and dry. No rash noted. No cyanosis. No pallor. No jaundice HEAD:  Normocephalic and atraumatic.  EYES: No scleral icterus MOUTH/THROAT: Moist oral membranes.  NECK: No JVD present. No thyromegaly noted. No carotid bruits  CHEST Normal respiratory effort. No intercostal retractions  LUNGS: Clear to auscultation bilaterally.  No stridor. No wheezes. No rales.  CARDIOVASCULAR: Regular rate and rhythm, positive S1-S2, no murmurs rubs or gallops appreciated. ABDOMINAL: Obese, soft, nontender, nondistended, positive bowel sounds all 4 quadrants.  No apparent ascites.  EXTREMITIES: No peripheral edema. Brace on the right lower leg.  HEMATOLOGIC: No significant bruising NEUROLOGIC: Oriented to person, place, and time. Nonfocal. Normal muscle tone.  PSYCHIATRIC: Normal mood and affect. Normal behavior. Cooperative  CARDIAC DATABASE: EKG: 01/16/2022: Normal sinus rhythm, 78 bpm, nonspecific T wave abnormality.  Echocardiogram: 12/2010: LVEF 45-50%, diffuse global hypokinesis per report.  07/24/2020: Left ventricle cavity is normal in size. Mild concentric hypertrophy of  the left ventricle. Normal global wall motion. LVEF 55-60%. Doppler evidence of grade I (impaired) diastolic dysfunction, normal LAP. Calculated EF 60%. Left atrial cavity is mildly dilated. Mild (Grade I) mitral regurgitation. Mild tricuspid regurgitation. No evidence of pulmonary hypertension. No significant change compared to previous study on 09/02/2017.  Stress Testing:  Lexiscan (Walking with mod Bruce) Sestamibi Stress Test 06/25/2020: Non-diagnostic ECG stress. Mild soft tissue attenuation in inferior wall. A small sized reversible mild defect in the inferior region cannot be excluded.  The LV is normal in size in both rest and stress images. Overall LV systolic function is abnormal without regional wall motion abnormalities. Global hypokinesis of the left ventricle. Stress LV EF: 39%. Findings may represent non ischemic cardiomyopathy.  No previous exam available for comparison.  Intermediate risk due to low LVEF. [place] Kitchen Clinical correlation recommended.   Heart Catheterization: Coronary angiogram 2/12: LAD 30%, D1 40%, oCFX 70-80%, mRCA 10-20%, ?occl. of apical LAD; Medical therapy. LVEF 30%  LABORATORY DATA:    Latest Ref Rng & Units 10/07/2021   12:02 PM 04/05/2021    8:47 AM 01/04/2021    9:24 AM  CBC  WBC 4.0 - 10.5 K/uL 5.4   5.2   4.2    Hemoglobin 12.0 - 15.0 g/dL 13.9   13.1   13.0    Hematocrit 36.0 - 46.0 % 41.5   39.6   40.2    Platelets 150 - 400 K/uL 131   215   203         Latest Ref Rng & Units 01/04/2021    9:24 AM 08/07/2020    4:08 PM 04/11/2015   10:00 AM  CMP  Glucose 70 - 99 mg/dL 229   157   112    BUN 8 - 23 mg/dL '26   29   22    ' Creatinine 0.44 - 1.00 mg/dL 1.77   1.47   1.34    Sodium 135 - 145 mmol/L 138   138   139    Potassium 3.5 - 5.1 mmol/L 4.2   4.2   4.0    Chloride 98 - 111 mmol/L 107   105   107    CO2 22 - 32 mmol/L '21   23   23    ' Calcium 8.9 - 10.3 mg/dL 10.0   9.5   9.8    Total Protein 6.5 - 8.1 g/dL 7.4   7.1     Total Bilirubin 0.3 - 1.2 mg/dL 0.7   0.7     Alkaline Phos 38 - 126 U/L 82   54     AST 15 - 41 U/L 18   18     ALT 0 - 44 U/L 16   22       Lipid Panel     Component Value Date/Time   CHOL 130 06/14/2013 1357   TRIG 108 06/14/2013 1357   HDL 50 06/14/2013 1357   CHOLHDL 2.6 06/14/2013 1357   VLDL 22 06/14/2013 1357   LDLCALC 58 06/14/2013 1357    Lab Results  Component Value Date   HGBA1C 7.2 01/02/2014   HGBA1C 7.4 09/29/2013   HGBA1C 7.4 06/14/2013   No components found for: NTPROBNP Lab Results  Component Value Date   TSH 1.991 03/11/2013   TSH 1.602 05/03/2011   TSH 1.962 10/02/2010   External Labs: Collected: 10/11/2021 available in epic. BUN 31, creatinine 1.74. eGFR 32 mL/min per 1.73 m. Sodium 138, potassium 5, chloride 105,  bicarb 23   FINAL MEDICATION LIST END OF ENCOUNTER: No orders of the defined types were placed in this encounter.     Current Outpatient  Medications:    ADVAIR HFA 115-21 MCG/ACT inhaler, Inhale 2 puffs into the lungs 2 (two) times daily as needed (sob and wheezing)., Disp: , Rfl:    albuterol (VENTOLIN HFA) 108 (90 Base) MCG/ACT inhaler, Inhale 2 puffs into the lungs every 6 (six) hours as needed for wheezing or shortness of breath., Disp: , Rfl:    aspirin 81 MG tablet, Take 1 tablet (81 mg total) by mouth daily., Disp: 30 tablet, Rfl: 3   glipiZIDE (GLUCOTROL) 10 MG tablet, Take 10 mg by mouth daily before breakfast., Disp: , Rfl:    lisinopril (ZESTRIL) 10 MG tablet, Take 10 mg by mouth daily. , Disp: , Rfl:    metFORMIN (GLUCOPHAGE-XR) 500 MG 24 hr tablet, Take 500 mg by mouth daily., Disp: , Rfl:    montelukast (SINGULAIR) 10 MG tablet, Take 1 tablet by mouth at bedtime., Disp: , Rfl:    Multiple Vitamin (MULTI-VITAMINS) TABS, TAKE 1 TABLET BY MOUTH ONCE DAILY, Disp: 30 tablet, Rfl: 3   nitroGLYCERIN (NITROSTAT) 0.4 MG SL tablet, Place 1 tablet (0.4 mg total) under the tongue every 5 (five) minutes as needed. If you require more than two tablets five minutes apart call 911 and go to nearest ER via EMS., Disp: 10 tablet, Rfl: 0   NYAMYC powder, Apply 1 Units topically 2 (two) times daily., Disp: , Rfl:    simvastatin (ZOCOR) 40 MG tablet, Take 1 tablet (40 mg total) by mouth at bedtime., Disp: 90 tablet, Rfl: 3   spironolactone (ALDACTONE) 50 MG tablet, Take 50 mg by mouth daily., Disp: , Rfl:   IMPRESSION:    ICD-10-CM   1. Atherosclerosis of native coronary artery of native heart without angina pectoris  I25.10 EKG 12-Lead    PCV ECHOCARDIOGRAM COMPLETE    2. Recovered cardiomyopathy  I42.9 PCV ECHOCARDIOGRAM COMPLETE    3. Benign hypertension  I10     4. Non-insulin dependent type 2 diabetes mellitus ([place])  E11.9     5. Mixed hyperlipidemia  E78.2     6. Class 2 severe obesity due to excess calories with serious comorbidity and body mass index (BMI) of 36.0 to 36.9 in adult [facility])  E66.01    Z68.36         RECOMMENDATIONS: Brittany Evans is a 66 y.o. female whose past medical history and cardiovascular risk factors include: Established coronary artery disease, recovered cardiomyopathy, non-insulin-dependent diabetes mellitus type 2, Hypertension, hyperlipidemia, obesity due to excess calories, postmenopausal female.  Atherosclerosis of native coronary artery of native heart without angina pectoris Denies angina pectoris. No use of sublingual nitroglycerin tablets since last office visit. Last echo and stress test from 2021 were reviewed as part of today's office visit. Left heart catheterization February 2012 noted to have CAD and LVEF reported to be 30% Medications reconciled. No changes warranted at this time. Plan to repeat an echocardiogram prior to the next office visit as a 3-year follow-up study given her history of cardiomyopathy and CAD Outside labs from February 2023 independently reviewed. Patient has a PCP appointment on 01/20/2022-advised to have her lipids checked.  Recovered cardiomyopathy Noted to have an LVEF of 30% per angiography report from February 2012. LVEF reported to be 55-60% as of December 2021.  Continue lisinopril, spironolactone, statin therapy, aspirin Not uptitrating GDMT as her blood pressure is well  controlled, remains euvolemic, and more importantly has chronic kidney disease stage IIIb with a EGFR of 32 mL/min per 1.73 m.   Benign hypertension Office blood pressures are well controlled. Antihypertensive medications reconciled. No changes warranted at this time.  Non-insulin dependent type 2 diabetes mellitus ([place]) Currently on ACE inhibitors and statin therapy. Currently managed by primary care provider. I have advised her to discuss the role of metformin given her serum creatinine and EGFR with her prescribing physician. We emphasized the importance of glycemic control.  Mixed hyperlipidemia Currently on simvastatin.   She denies myalgia or  other side effects. Recommended to have fasting lipid profile with her PCP as part of her annual well visit and to have a copy forwarded to the practice for reference. Currently managed by primary care provider.  Class 2 severe obesity due to excess calories with serious comorbidity and body mass index (BMI) of 36.0 to 36.9 in adult [facility]) Body mass index is 36.11 kg/m. I reviewed with the patient the importance of diet, regular physical activity/exercise, weight loss.   Patient is educated on increasing physical activity gradually as tolerated.  With the goal of moderate intensity exercise for 30 minutes a day 5 days a week.  Orders Placed This Encounter  Procedures   EKG 12-Lead   PCV ECHOCARDIOGRAM COMPLETE   --Continue cardiac medications as reconciled in final medication list. --Return in about 1 year (around 01/17/2023) for Follow up, CAD, recovered cardiomyopathy, review echo. Or sooner if needed. --Continue follow-up with your primary care physician regarding the management of your other chronic comorbid conditions.  Patient's questions and concerns were addressed to her satisfaction. She voices understanding of the instructions provided during this encounter.   This note was created using a voice recognition software as a result there may be grammatical errors inadvertently enclosed that do not reflect the nature of this encounter. Every attempt is made to correct such errors.  Rex Kras, [place], [facility]  Pager: [phone] Office: [phone]

## 2022-01-24 ENCOUNTER — Encounter: Payer: Self-pay | Admitting: Hematology and Oncology

## 2022-01-27 ENCOUNTER — Ambulatory Visit: Payer: Medicare PPO

## 2022-01-27 DIAGNOSIS — I429 Cardiomyopathy, unspecified: Secondary | ICD-10-CM

## 2022-01-27 DIAGNOSIS — I2510 Atherosclerotic heart disease of native coronary artery without angina pectoris: Secondary | ICD-10-CM

## 2022-04-07 ENCOUNTER — Inpatient Hospital Stay: Payer: Medicare HMO | Attending: Hematology and Oncology

## 2022-04-07 ENCOUNTER — Other Ambulatory Visit: Payer: Self-pay

## 2022-04-07 DIAGNOSIS — Z7982 Long term (current) use of aspirin: Secondary | ICD-10-CM | POA: Insufficient documentation

## 2022-04-07 DIAGNOSIS — D72819 Decreased white blood cell count, unspecified: Secondary | ICD-10-CM | POA: Insufficient documentation

## 2022-04-07 DIAGNOSIS — Z79899 Other long term (current) drug therapy: Secondary | ICD-10-CM | POA: Diagnosis not present

## 2022-04-07 DIAGNOSIS — D539 Nutritional anemia, unspecified: Secondary | ICD-10-CM

## 2022-04-07 DIAGNOSIS — Z7984 Long term (current) use of oral hypoglycemic drugs: Secondary | ICD-10-CM | POA: Diagnosis not present

## 2022-04-07 DIAGNOSIS — D500 Iron deficiency anemia secondary to blood loss (chronic): Secondary | ICD-10-CM | POA: Diagnosis present

## 2022-04-07 LAB — CBC WITH DIFFERENTIAL/PLATELET
Abs Immature Granulocytes: 0.01 10*3/uL (ref 0.00–0.07)
Basophils Absolute: 0 10*3/uL (ref 0.0–0.1)
Basophils Relative: 1 %
Eosinophils Absolute: 0.1 10*3/uL (ref 0.0–0.5)
Eosinophils Relative: 3 %
HCT: 38.3 % (ref 36.0–46.0)
Hemoglobin: 13 g/dL (ref 12.0–15.0)
Immature Granulocytes: 0 %
Lymphocytes Relative: 38 %
Lymphs Abs: 1.5 10*3/uL (ref 0.7–4.0)
MCH: 30.2 pg (ref 26.0–34.0)
MCHC: 33.9 g/dL (ref 30.0–36.0)
MCV: 89.1 fL (ref 80.0–100.0)
Monocytes Absolute: 0.3 10*3/uL (ref 0.1–1.0)
Monocytes Relative: 8 %
Neutro Abs: 1.9 10*3/uL (ref 1.7–7.7)
Neutrophils Relative %: 50 %
Platelets: 198 10*3/uL (ref 150–400)
RBC: 4.3 MIL/uL (ref 3.87–5.11)
RDW: 13.1 % (ref 11.5–15.5)
WBC: 3.9 10*3/uL — ABNORMAL LOW (ref 4.0–10.5)
nRBC: 0 % (ref 0.0–0.2)

## 2022-04-07 LAB — IRON AND IRON BINDING CAPACITY (CC-WL,HP ONLY)
Iron: 54 ug/dL (ref 28–170)
Saturation Ratios: 15 % (ref 10.4–31.8)
TIBC: 358 ug/dL (ref 250–450)
UIBC: 304 ug/dL (ref 148–442)

## 2022-04-07 LAB — FERRITIN: Ferritin: 59 ng/mL (ref 11–307)

## 2022-04-08 ENCOUNTER — Inpatient Hospital Stay (HOSPITAL_BASED_OUTPATIENT_CLINIC_OR_DEPARTMENT_OTHER): Payer: Medicare HMO | Admitting: Hematology and Oncology

## 2022-04-08 DIAGNOSIS — D72819 Decreased white blood cell count, unspecified: Secondary | ICD-10-CM | POA: Diagnosis not present

## 2022-04-08 DIAGNOSIS — D500 Iron deficiency anemia secondary to blood loss (chronic): Secondary | ICD-10-CM | POA: Diagnosis not present

## 2022-04-09 ENCOUNTER — Encounter: Payer: Self-pay | Admitting: Hematology and Oncology

## 2022-04-09 DIAGNOSIS — D72819 Decreased white blood cell count, unspecified: Secondary | ICD-10-CM | POA: Insufficient documentation

## 2022-04-09 NOTE — Assessment & Plan Note (Signed)
She has mild intermittent leukopenia, cause is unknown and she is not symptomatic She does not need long-term follow-up for this

## 2022-04-09 NOTE — Progress Notes (Signed)
[place] OFFICE PROGRESS NOTE  [name], FNP  ASSESSMENT & PLAN:  Iron deficiency anemia due to chronic blood loss Her iron deficiency anemia has resolved Over the past year, she has no recurrence She does not need long-term follow-up I recommend she follows with her primary care doctor for CBC check and she is educated to call me if she developed anemia again  Leukopenia She has mild intermittent leukopenia, cause is unknown and she is not symptomatic She does not need long-term follow-up for this  No orders of the defined types were placed in this encounter.   The total time spent in the appointment was 20 minutes encounter with patients including review of chart and various tests results, discussions about plan of care and coordination of care plan   All questions were answered. The patient knows to call the clinic with any problems, questions or concerns. No barriers to learning was detected.    [name], MD 8/23/20238:47 AM  INTERVAL HISTORY: Brittany Evans 66 y.o. female returns for follow-up on history of iron deficiency anemia She is doing well She is not symptomatic No recent bleeding Her energy level is good  SUMMARY OF HEMATOLOGIC HISTORY:  She was found to have abnormal CBC from recent blood work I have the opportunity to review her CBC dated back to 12/09/2007 Her prior hemoglobin has been normal or as high as 15.4 noted on 03/11/2013 Starting on 08/07/2020, she was noted to be anemic with hemoglobin of 12.2.  She presented to the emergency room with rectal bleeding and was referred to GI service for evaluation Her most recent hemoglobin on 10/11/2020 showed hemoglobin of 8, MCV of 73.4  She denies recent chest pain on exertion but had recent shortness of breath on minimal exertion, several syncopal episodes leading to sprained ankle and palpitations. She had not noticed any recent bleeding such as epistaxis, hematuria or  hemoptysis.  She had her last menstruation was 4 years ago.  She has no further postmenopausal bleeding after D&C.  Result of her D&C was normal  The patient denies over the counter NSAID ingestion. She is on antiplatelets agent with aspirin for secondary prevention. Her last colonoscopy was on 10/17/2020. Report showed  - Diverticulosis in the sigmoid colon, in the descending colon and in the transverse colon. This may explain 2 episodes of hematochezia, but not iron deficiency. - The examination was otherwise normal on direct and retroflexion views. - No specimens collected.  EGD on 10/17/2020 showed  - Normal esophagus. - A single gastric polyp. Resected and retrieved. - Normal examined duodenum. Biopsied. - Biopsies were taken with a cold forceps for histology and Helicobacter pylori testing.  She had no prior history or diagnosis of cancer. Her age appropriate screening programs are up-to-date. She has pica with ice craving and eats a variety of diet. She never donated blood or received blood transfusion.  This is due to her religious belief being Jehovah witness The patient was prescribed oral iron supplements and she takes daily without benefit so far From 11/02/2020 to 11/16/2020, she received 3 doses of intravenous iron sucrose 300 mg dose  I have reviewed the past medical history, past surgical history, social history and family history with the patient and they are unchanged from previous note.  ALLERGIES:  is allergic to trulicity [dulaglutide].  MEDICATIONS:  Current Outpatient Medications  Medication Sig Dispense Refill   ADVAIR HFA 115-21 MCG/ACT inhaler Inhale 2 puffs into the lungs  2 (two) times daily as needed (sob and wheezing).     albuterol (VENTOLIN HFA) 108 (90 Base) MCG/ACT inhaler Inhale 2 puffs into the lungs every 6 (six) hours as needed for wheezing or shortness of breath.     aspirin 81 MG tablet Take 1 tablet (81 mg total) by mouth daily. 30 tablet 3   glipiZIDE  (GLUCOTROL) 10 MG tablet Take 10 mg by mouth daily before breakfast.     lisinopril (ZESTRIL) 10 MG tablet Take 10 mg by mouth daily.      metFORMIN (GLUCOPHAGE-XR) 500 MG 24 hr tablet Take 500 mg by mouth daily.     montelukast (SINGULAIR) 10 MG tablet Take 1 tablet by mouth at bedtime.     Multiple Vitamin (MULTI-VITAMINS) TABS TAKE 1 TABLET BY MOUTH ONCE DAILY 30 tablet 3   nitroGLYCERIN (NITROSTAT) 0.4 MG SL tablet Place 1 tablet (0.4 mg total) under the tongue every 5 (five) minutes as needed. If you require more than two tablets five minutes apart call 911 and go to nearest ER via EMS. 10 tablet 0   NYAMYC powder Apply 1 Units topically 2 (two) times daily.     simvastatin (ZOCOR) 40 MG tablet Take 1 tablet (40 mg total) by mouth at bedtime. 90 tablet 3   spironolactone (ALDACTONE) 50 MG tablet Take 50 mg by mouth daily.     No current facility-administered medications for this visit.     REVIEW OF SYSTEMS:   Constitutional: Denies fevers, chills or night sweats Eyes: Denies blurriness of vision Ears, nose, mouth, throat, and face: Denies mucositis or sore throat Respiratory: Denies cough, dyspnea or wheezes Cardiovascular: Denies palpitation, chest discomfort or lower extremity swelling Gastrointestinal:  Denies nausea, heartburn or change in bowel habits Skin: Denies abnormal skin rashes Lymphatics: Denies new lymphadenopathy or easy bruising Neurological:Denies numbness, tingling or new weaknesses Behavioral/Psych: Mood is stable, no new changes  All other systems were reviewed with the patient and are negative.  PHYSICAL EXAMINATION: ECOG PERFORMANCE STATUS: 0 - Asymptomatic  Vitals:   04/08/22 1242  BP: 131/71  Pulse: (!) 56  Resp: 18  Temp: 97.9 F (36.6 C)  SpO2: 98%   Filed Weights   04/08/22 1242  Weight: 216 lb 9.6 oz (98.2 kg)    GENERAL:alert, no distress and comfortable NEURO: alert & oriented x 3 with fluent speech, no focal motor/sensory  deficits  LABORATORY DATA:  I have reviewed the data as listed     Component Value Date/Time   NA 138 01/04/2021 0924   K 4.2 01/04/2021 0924   CL 107 01/04/2021 0924   CO2 21 (L) 01/04/2021 0924   GLUCOSE 229 (H) 01/04/2021 0924   BUN 26 (H) 01/04/2021 0924   CREATININE 1.77 (H) 01/04/2021 0924   CREATININE 1.23 (H) 04/29/2013 0946   CALCIUM 10.0 01/04/2021 0924   PROT 7.4 01/04/2021 0924   ALBUMIN 3.9 01/04/2021 0924   AST 18 01/04/2021 0924   ALT 16 01/04/2021 0924   ALKPHOS 82 01/04/2021 0924   BILITOT 0.7 01/04/2021 0924   GFRNONAA 32 (L) 01/04/2021 0924   GFRNONAA 48 (L) 03/18/2013 1050   GFRAA 50 (L) 04/11/2015 1000   GFRAA 56 (L) 03/18/2013 1050    No results found for: "SPEP", "UPEP"  Lab Results  Component Value Date   WBC 3.9 (L) 04/07/2022   NEUTROABS 1.9 04/07/2022   HGB 13.0 04/07/2022   HCT 38.3 04/07/2022   MCV 89.1 04/07/2022   PLT 198 04/07/2022  Chemistry      Component Value Date/Time   NA 138 01/04/2021 0924   K 4.2 01/04/2021 0924   CL 107 01/04/2021 0924   CO2 21 (L) 01/04/2021 0924   BUN 26 (H) 01/04/2021 0924   CREATININE 1.77 (H) 01/04/2021 0924   CREATININE 1.23 (H) 04/29/2013 0946      Component Value Date/Time   CALCIUM 10.0 01/04/2021 0924   ALKPHOS 82 01/04/2021 0924   AST 18 01/04/2021 0924   ALT 16 01/04/2021 0924   BILITOT 0.7 01/04/2021 4665

## 2022-04-09 NOTE — Assessment & Plan Note (Signed)
Her iron deficiency anemia has resolved Over the past year, she has no recurrence She does not need long-term follow-up I recommend she follows with her primary care doctor for CBC check and she is educated to call me if she developed anemia again

## 2022-05-19 ENCOUNTER — Other Ambulatory Visit: Payer: Self-pay | Admitting: Family

## 2022-05-19 DIAGNOSIS — E2839 Other primary ovarian failure: Secondary | ICD-10-CM

## 2022-06-12 ENCOUNTER — Other Ambulatory Visit: Payer: Medicare HMO

## 2022-06-16 ENCOUNTER — Other Ambulatory Visit: Payer: Self-pay | Admitting: Family

## 2022-06-16 ENCOUNTER — Ambulatory Visit
Admission: RE | Admit: 2022-06-16 | Discharge: 2022-06-16 | Disposition: A | Discharge status: Home / Self Care | Payer: Medicare HMO | Source: Ambulatory Visit | Attending: Family | Admitting: Family

## 2022-06-16 DIAGNOSIS — E2839 Other primary ovarian failure: Secondary | ICD-10-CM

## 2022-06-19 ENCOUNTER — Other Ambulatory Visit: Payer: Medicare HMO

## 2023-01-05 ENCOUNTER — Ambulatory Visit: Payer: Medicare Other | Admitting: Cardiology

## 2023-01-05 ENCOUNTER — Encounter: Payer: Self-pay | Admitting: Cardiology

## 2023-01-05 VITALS — BP 129/87 | HR 86 | Resp 18 | Ht 65.0 in | Wt 212.8 lb

## 2023-01-05 DIAGNOSIS — E119 Type 2 diabetes mellitus without complications: Secondary | ICD-10-CM

## 2023-01-05 DIAGNOSIS — I429 Cardiomyopathy, unspecified: Secondary | ICD-10-CM

## 2023-01-05 DIAGNOSIS — I129 Hypertensive chronic kidney disease with stage 1 through stage 4 chronic kidney disease, or unspecified chronic kidney disease: Secondary | ICD-10-CM

## 2023-01-05 DIAGNOSIS — E782 Mixed hyperlipidemia: Secondary | ICD-10-CM

## 2023-01-05 DIAGNOSIS — I2510 Atherosclerotic heart disease of native coronary artery without angina pectoris: Secondary | ICD-10-CM

## 2023-01-05 DIAGNOSIS — N184 Chronic kidney disease, stage 4 (severe): Secondary | ICD-10-CM

## 2023-01-05 NOTE — Progress Notes (Signed)
Brittany Evans Date of Birth: 06-May-1956 MRN: [id number] Primary Care Provider:Merino Festus Aloe, MD Former Cardiology Providers: Altamese Amity, APRN, FNP-C  Primary Cardiologist: Tessa Lerner, DO, [facility] (established care 12/14/2019)  Date: 01/05/23 Last Office Visit: 01/16/2022  Chief Complaint  Patient presents with   Coronary Artery Disease   recovered cardiomyopathy   Follow-up   HPI  Brittany Evans is a 67 y.o.  female whose past medical history and cardiovascular risk factors include: Established coronary artery disease, recovered cardiomyopathy, non-insulin-dependent diabetes mellitus type 2, Hypertension with CKD 3B/4, hyperlipidemia, obesity due to excess calories, postmenopausal female.  Patient is being followed by the practice given her history of CAD and recovered cardiomyopathy.   Over the last 6 months she denies anginal chest pain or heart failure symptoms.  However, recently she been working with primary care and nephrology with regards to medication titration.  During the office visit requested the last office note from [facility].  Upon reviewing the last progress note it appears she was being transitioned to SGLT2 inhibitors during the process she developed acute kidney injury.  She was recommended to come off of lisinopril as well as metformin.  Her most recent GFR is consistent with CKD 4 and A1c is 7.8%.  She is requesting further assistance from a cardiovascular standpoint and the need of being on ACE inhibitor/ARB.  ALLERGIES: Allergies  Allergen Reactions   Trulicity [Dulaglutide] Nausea And Vomiting   MEDICATION LIST PRIOR TO VISIT: Current Outpatient Medications on File Prior to Visit  Medication Sig Dispense Refill   ADVAIR HFA 115-21 MCG/ACT inhaler Inhale 2 puffs into the lungs 2 (two) times daily as needed (sob and wheezing).     albuterol (VENTOLIN HFA) 108 (90 Base) MCG/ACT inhaler Inhale 2 puffs into the lungs every 6 (six)  hours as needed for wheezing or shortness of breath.     aspirin 81 MG tablet Take 1 tablet (81 mg total) by mouth daily. 30 tablet 3   Calcium Carb-Cholecalciferol (CALCIUM 500 + D PO) Take by mouth.     Coenzyme Q10 (COQ10) 100 MG CAPS Take by mouth.     Iron-FA-B Cmp-C-Biot-Probiotic (FUSION PLUS PO) Take by mouth.     JARDIANCE 25 MG TABS tablet Take 25 mg by mouth daily.     montelukast (SINGULAIR) 10 MG tablet Take 1 tablet by mouth at bedtime.     nitroGLYCERIN (NITROSTAT) 0.4 MG SL tablet Place 1 tablet (0.4 mg total) under the tongue every 5 (five) minutes as needed. If you require more than two tablets five minutes apart call 911 and go to nearest ER via EMS. 10 tablet 0   simvastatin (ZOCOR) 40 MG tablet Take 1 tablet (40 mg total) by mouth at bedtime. 90 tablet 3   spironolactone (ALDACTONE) 50 MG tablet Take 50 mg by mouth daily.     glipiZIDE (GLUCOTROL) 10 MG tablet Take 10 mg by mouth daily before breakfast. (Patient not taking: Reported on 01/05/2023)     lisinopril (ZESTRIL) 10 MG tablet Take 10 mg by mouth daily.  (Patient not taking: Reported on 01/05/2023)     metFORMIN (GLUCOPHAGE-XR) 500 MG 24 hr tablet Take 500 mg by mouth daily. (Patient not taking: Reported on 01/05/2023)     No current facility-administered medications on file prior to visit.    PAST MEDICAL HISTORY: Past Medical History:  Diagnosis Date   Allergy    Anemia    low iron- has appt to see MD  Asthma    inhaler use as needed rarely   CAD (coronary artery disease)    NSTEMI 2/12: LAD 30%, D1 40%, oCFX 70-80%, mRCA 10-20%, ?occl. of apical LAD; treated medically   Cataract    CHF (congestive heart failure) (HCC)    Chronic kidney disease    stage 3 chronic kidney disease- pt states she is asymptomatic   Complication of anesthesia    Lightheaded for several days   Diabetes mellitus    DVT (deep venous thrombosis) (HCC)    left leg x2   Dysrhythmia    Hyperlipidemia    Hypertension     Myocardial infarction (HCC)    Neuromuscular disorder (HCC)    neuropathy due to DM   NICM (nonischemic cardiomyopathy) (HCC)    EF 20-25%, 45% 2012   Obesity    Peripheral vascular disease (HCC)    Systolic CHF (HCC)     PAST SURGICAL HISTORY: Past Surgical History:  Procedure Laterality Date   COLONOSCOPY     DILATATION & CURETTAGE/HYSTEROSCOPY WITH MYOSURE N/A 07/18/2014   Procedure: DILATATION & CURETTAGE/HYSTEROSCOPY WITH MYOSURE;  Surgeon: Adam Phenix, MD;  Location: [facility];  Service: Gynecology;  Laterality: N/A;   DILATATION & CURRETTAGE/HYSTEROSCOPY WITH RESECTOCOPE N/A 04/12/2015   Procedure:  DIAGNOSTIC HYSTEROSCOPY WITH RESECTION OF FIBROID AND POLYPS;  Surgeon: Maxie Better, MD;  Location: [facility];  Service: Gynecology;  Laterality: N/A;   DILATION AND CURETTAGE OF UTERUS N/A 04/12/2015   Procedure: DILATATION AND CURETTAGE;  Surgeon: Maxie Better, MD;  Location: [facility];  Service: Gynecology;  Laterality: N/A;   None     POLYPECTOMY N/A 07/18/2014   Procedure: Vaginal POLYPECTOMY;  Surgeon: Adam Phenix, MD;  Location: [facility];  Service: Gynecology;  Laterality: N/A;   WISDOM TOOTH EXTRACTION      FAMILY HISTORY: The patient's family history includes Alcohol abuse in her father; Breast cancer in her cousin; Cancer in her mother and sister; Cirrhosis in her father; Coronary artery disease in her mother; Diabetes in her mother; Heart disease in her father and mother; Lung disease in her sister; Pancreatic cancer in her maternal grandmother; Rectal cancer in her mother.   SOCIAL HISTORY:  The patient  reports that she has never smoked. She has never used smokeless tobacco. She reports that she does not drink alcohol and does not use drugs.  Review of Systems  Cardiovascular:  Negative for chest pain, dyspnea on exertion, leg swelling, near-syncope, orthopnea, palpitations, paroxysmal nocturnal dyspnea and syncope.  Respiratory:  Negative for shortness of breath.      PHYSICAL EXAM:    01/05/2023   11:52 AM 04/08/2022   12:42 PM 01/16/2022   10:35 AM  Vitals with BMI  Height 5\' 5"  5\' 5"  5\' 5"   Weight 212 lbs 13 oz 216 lbs 10 oz 217 lbs  BMI 35.41 36.04 36.11  Systolic 129 131 914  Diastolic 87 71 79  Pulse 86 56 93   Physical Exam  Constitutional: No distress.  Age appropriate, hemodynamically stable.   Neck: No JVD present.  Cardiovascular: Normal rate, regular rhythm, S1 normal, S2 normal, intact distal pulses and normal pulses. Exam reveals no gallop, no S3 and no S4.  No murmur heard. Pulmonary/Chest: Effort normal and breath sounds normal. No stridor. She has no wheezes. She has no rales.  Abdominal: Soft. Bowel sounds are normal. She exhibits no distension. There is no abdominal tenderness.  Musculoskeletal:        General: No edema.  Cervical back: Neck supple.  Neurological: She is alert and oriented to person, place, and time. She has intact cranial nerves (2-12).  Skin: Skin is warm and moist.   CARDIAC DATABASE: EKG: Jan 05, 2023: Sinus rhythm, 73 bpm, nonspecific T wave abnormality, without underlying injury pattern.  Compared to 01/16/2022 lateral TWIs are no longer present.  Echocardiogram: 12/2010: LVEF 45-50%, diffuse global hypokinesis per report.  07/24/2020: Left ventricle cavity is normal in size. Mild concentric hypertrophy of the left ventricle. Normal global wall motion. LVEF 55-60%. Doppler evidence of grade I (impaired) diastolic dysfunction, normal LAP. Calculated EF 60%. Left atrial cavity is mildly dilated. Mild (Grade I) mitral regurgitation. Mild tricuspid regurgitation. No evidence of pulmonary hypertension. No significant change compared to previous study on 09/02/2017.  01/27/2022:  Left ventricle cavity is normal in size. Mild concentric hypertrophy of the left ventricle. Normal global wall motion. Normal LV systolic function with EF 58%. Doppler evidence of grade I (impaired) diastolic dysfunction,  normal LAP.  Mild (Grade I) mitral regurgitation.  Mild tricuspid regurgitation.  No evidence of pulmonary hypertension.  No significant change compared to previous study on 07/24/2020.   Stress Testing:  Lexiscan (Walking with mod Bruce) Sestamibi Stress Test 06/25/2020: Non-diagnostic ECG stress. Mild soft tissue attenuation in inferior wall. A small sized reversible mild defect in the inferior region cannot be excluded.  The LV is normal in size in both rest and stress images. Overall LV systolic function is abnormal without regional wall motion abnormalities. Global hypokinesis of the left ventricle. Stress LV EF: 39%. Findings may represent non ischemic cardiomyopathy.  No previous exam available for comparison. Intermediate risk due to low LVEF. Marland Kitchen Clinical correlation recommended.   Heart Catheterization: Coronary angiogram 2/12: LAD 30%, D1 40%, oCFX 70-80%, mRCA 10-20%, ?occl. of apical LAD; Medical therapy. LVEF 30%  LABORATORY DATA:    Latest Ref Rng & Units 04/07/2022    9:12 AM 10/07/2021   12:02 PM 04/05/2021    8:47 AM  CBC  WBC 4.0 - 10.5 K/uL 3.9  5.4  5.2   Hemoglobin 12.0 - 15.0 g/dL 29.5  62.1  30.8   Hematocrit 36.0 - 46.0 % 38.3  41.5  39.6   Platelets 150 - 400 K/uL 198  131  215        Latest Ref Rng & Units 01/04/2021    9:24 AM 08/07/2020    4:08 PM 04/11/2015   10:00 AM  CMP  Glucose 70 - 99 mg/dL 657  846  962   BUN 8 - 23 mg/dL 26  29  22    Creatinine 0.44 - 1.00 mg/dL 9.52  8.41  3.24   Sodium 135 - 145 mmol/L 138  138  139   Potassium 3.5 - 5.1 mmol/L 4.2  4.2  4.0   Chloride 98 - 111 mmol/L 107  105  107   CO2 22 - 32 mmol/L 21  23  23    Calcium 8.9 - 10.3 mg/dL 40.1  9.5  9.8   Total Protein 6.5 - 8.1 g/dL 7.4  7.1    Total Bilirubin 0.3 - 1.2 mg/dL 0.7  0.7    Alkaline Phos 38 - 126 U/L 82  54    AST 15 - 41 U/L 18  18    ALT 0 - 44 U/L 16  22      Lipid Panel     Component Value Date/Time   CHOL 130 06/14/2013 1357   TRIG 108  06/14/2013  1357   HDL 50 06/14/2013 1357   CHOLHDL 2.6 06/14/2013 1357   VLDL 22 06/14/2013 1357   LDLCALC 58 06/14/2013 1357    Lab Results  Component Value Date   HGBA1C 7.2 01/02/2014   HGBA1C 7.4 09/29/2013   HGBA1C 7.4 06/14/2013   No components found for: "NTPROBNP" Lab Results  Component Value Date   TSH 1.991 03/11/2013   TSH 1.602 05/03/2011   TSH 1.962 10/02/2010   External Labs: Collected: 10/11/2021 available in epic. BUN 31, creatinine 1.74. eGFR 32 mL/min per 1.73 m. Sodium 138, potassium 5, chloride 105, bicarb 23  External Labs: Collected: December 05, 2022 [facility] BUN 34, creatinine 1.99. Serum glucose 266. eGFR 27 mL/min per 1.73 m. Sodium 136, potassium 4.5, chloride 101, bicarb 24 Hemoglobin A1c 7.8%   FINAL MEDICATION LIST END OF ENCOUNTER: No orders of the defined types were placed in this encounter.     Current Outpatient Medications:    ADVAIR HFA 115-21 MCG/ACT inhaler, Inhale 2 puffs into the lungs 2 (two) times daily as needed (sob and wheezing)., Disp: , Rfl:    albuterol (VENTOLIN HFA) 108 (90 Base) MCG/ACT inhaler, Inhale 2 puffs into the lungs every 6 (six) hours as needed for wheezing or shortness of breath., Disp: , Rfl:    aspirin 81 MG tablet, Take 1 tablet (81 mg total) by mouth daily., Disp: 30 tablet, Rfl: 3   Calcium Carb-Cholecalciferol (CALCIUM 500 + D PO), Take by mouth., Disp: , Rfl:    Coenzyme Q10 (COQ10) 100 MG CAPS, Take by mouth., Disp: , Rfl:    Iron-FA-B Cmp-C-Biot-Probiotic (FUSION PLUS PO), Take by mouth., Disp: , Rfl:    JARDIANCE 25 MG TABS tablet, Take 25 mg by mouth daily., Disp: , Rfl:    montelukast (SINGULAIR) 10 MG tablet, Take 1 tablet by mouth at bedtime., Disp: , Rfl:    nitroGLYCERIN (NITROSTAT) 0.4 MG SL tablet, Place 1 tablet (0.4 mg total) under the tongue every 5 (five) minutes as needed. If you require more than two tablets five minutes apart call 911 and go to nearest ER via EMS., Disp: 10  tablet, Rfl: 0   simvastatin (ZOCOR) 40 MG tablet, Take 1 tablet (40 mg total) by mouth at bedtime., Disp: 90 tablet, Rfl: 3   spironolactone (ALDACTONE) 50 MG tablet, Take 50 mg by mouth daily., Disp: , Rfl:    glipiZIDE (GLUCOTROL) 10 MG tablet, Take 10 mg by mouth daily before breakfast. (Patient not taking: Reported on 01/05/2023), Disp: , Rfl:    lisinopril (ZESTRIL) 10 MG tablet, Take 10 mg by mouth daily.  (Patient not taking: Reported on 01/05/2023), Disp: , Rfl:    metFORMIN (GLUCOPHAGE-XR) 500 MG 24 hr tablet, Take 500 mg by mouth daily. (Patient not taking: Reported on 01/05/2023), Disp: , Rfl:   IMPRESSION:    ICD-10-CM   1. Atherosclerosis of native coronary artery of native heart without angina pectoris  I25.10 EKG 12-Lead    CANCELED: PCV ECHOCARDIOGRAM COMPLETE    2. Recovered cardiomyopathy  I42.9 CANCELED: PCV ECHOCARDIOGRAM COMPLETE    3. Benign hypertensive CKD, stage 1-4 or unspecified chronic kidney disease  I12.9 CMP14+EGFR    4. Chronic kidney disease, stage IV (severe) (HCC)  N18.4     5. Non-insulin dependent type 2 diabetes mellitus (HCC)  E11.9 Lipid Panel With LDL/HDL Ratio    LDL cholesterol, direct    CMP14+EGFR    6. Mixed hyperlipidemia  E78.2     7. Class 2  severe obesity due to excess calories with serious comorbidity and body mass index (BMI) of 36.0 to 36.9 in adult [facility])  E66.01    Z68.36        RECOMMENDATIONS: DEA SOUFFRANT is a 67 y.o. female whose past medical history and cardiovascular risk factors include: Established coronary artery disease, recovered cardiomyopathy, non-insulin-dependent diabetes mellitus type 2, Hypertension with CKD stage stage IIIb/IV, hyperlipidemia, obesity due to excess calories, postmenopausal female.  Atherosclerosis of native coronary artery of native heart without angina pectoris Denies angina pectoris. No use of sublingual nitroglycerin tablets since last office visit. Left heart catheterization February  2012 noted to have CAD and LVEF reported to be 30% Medications reconciled.  Plan to repeat an echocardiogram prior to the next office visit as a 3-year follow-up study given her history of cardiomyopathy and CAD Outside labs from February 2023 independently reviewed. Patient has a PCP appointment on 01/20/2022-advised to have her lipids checked.  Recovered cardiomyopathy Noted to have an LVEF of 30% per angiography report from February 2012. LVEF reported to be 55-60% as of December 2021.  June 2023 LVEF reported to be 58%.  See report for additional details.  Currently lisinopril has been held secondary to AKI on CKD. She has been off of lisinopril for 2 weeks at least. It is reasonable to repeat a BMP to reevaluate renal function.  If renal function is within acceptable limits I would recommend reducing spironolactone to 25 mg p.o. daily and restarting either ACE/ARB given her history of cardiomyopathy and underlying diabetes (renal protection). Will also repeat fasting lipid profile-she does not recall when it was last checked.   Benign hypertension with chronic kidney disease stage IIIb/IV Office blood pressures are well controlled. Off of ACE inhibitors for now. Will repeat labs and if renal function is stable would recommend reducing spironolactone to 25 mg p.o. daily and restarting either ACE inhibitors or starting ARB.  With close follow-up of labs.  Patient is agreeable with the plan of care. Baseline serum creatinine around 1.7 mg/dL.  Non-insulin dependent type 2 diabetes mellitus (HCC) Most recent hemoglobin A1c 7.8. Currently managed by primary care provider. Given her renal function recommend holding metformin and considering other agents to improve glycemic control  Mixed hyperlipidemia Currently on simvastatin.   She denies myalgia or other side effects. Check fasting lipid profile.  Class 2 severe obesity due to excess calories with serious comorbidity and body mass  index (BMI) of 35.0 to 35.9 in adult [facility]) Body mass index is 35.41 kg/m. I reviewed with the patient the importance of diet, regular physical activity/exercise, weight loss.   Patient is educated on increasing physical activity gradually as tolerated.  With the goal of moderate intensity exercise for 30 minutes a day 5 days a week.  Orders Placed This Encounter  Procedures   Lipid Panel With LDL/HDL Ratio   LDL cholesterol, direct   [id number]   EKG 12-Lead   --Continue cardiac medications as reconciled in final medication list. --Return in about 3 months (around 04/07/2023) for Follow up heart failure with improved EF, CAD. Or sooner if needed. --Continue follow-up with your primary care physician regarding the management of your other chronic comorbid conditions.  Patient's questions and concerns were addressed to her satisfaction. She voices understanding of the instructions provided during this encounter.   This note was created using a voice recognition software as a result there may be grammatical errors inadvertently enclosed that do not reflect the nature of this encounter.  Every attempt is made to correct such errors.  Tessa Lerner, [place], [facility]  Pager:  [phone] Office: [phone]

## 2023-01-08 LAB — CMP14+EGFR
ALT: 25 IU/L (ref 0–32)
AST: 19 IU/L (ref 0–40)
Albumin/Globulin Ratio: 1.7 (ref 1.2–2.2)
Albumin: 4.8 g/dL (ref 3.9–4.9)
Alkaline Phosphatase: 84 IU/L (ref 44–121)
BUN/Creatinine Ratio: 16 (ref 12–28)
BUN: 36 mg/dL — ABNORMAL HIGH (ref 8–27)
Bilirubin Total: 0.7 mg/dL (ref 0.0–1.2)
CO2: 20 mmol/L (ref 20–29)
Calcium: 10.1 mg/dL (ref 8.7–10.3)
Chloride: 101 mmol/L (ref 96–106)
Creatinine, Ser: 2.24 mg/dL — ABNORMAL HIGH (ref 0.57–1.00)
Globulin, Total: 2.8 g/dL (ref 1.5–4.5)
Glucose: 162 mg/dL — ABNORMAL HIGH (ref 70–99)
Potassium: 5.3 mmol/L — ABNORMAL HIGH (ref 3.5–5.2)
Sodium: 138 mmol/L (ref 134–144)
Total Protein: 7.6 g/dL (ref 6.0–8.5)
eGFR: 24 mL/min/{1.73_m2} — ABNORMAL LOW (ref >59)

## 2023-01-08 LAB — LIPID PANEL WITH LDL/HDL RATIO
Cholesterol, Total: 189 mg/dL (ref 100–199)
HDL: 71 mg/dL (ref >39)
LDL Chol Calc (NIH): 101 mg/dL — ABNORMAL HIGH (ref 0–99)
LDL/HDL Ratio: 1.4 ratio (ref 0.0–3.2)
Triglycerides: 95 mg/dL (ref 0–149)
VLDL Cholesterol Cal: 17 mg/dL (ref 5–40)

## 2023-01-08 LAB — LDL CHOLESTEROL, DIRECT: LDL Direct: 102 mg/dL — ABNORMAL HIGH (ref 0–99)

## 2023-01-09 ENCOUNTER — Other Ambulatory Visit: Payer: Self-pay

## 2023-01-09 ENCOUNTER — Emergency Department (HOSPITAL_COMMUNITY)
Admission: EM | Admit: 2023-01-09 | Discharge: 2023-01-09 | Disposition: A | Discharge status: Home / Self Care | Payer: Medicare Other | Attending: Emergency Medicine | Admitting: Emergency Medicine

## 2023-01-09 DIAGNOSIS — R748 Abnormal levels of other serum enzymes: Secondary | ICD-10-CM | POA: Diagnosis present

## 2023-01-09 DIAGNOSIS — Z7984 Long term (current) use of oral hypoglycemic drugs: Secondary | ICD-10-CM | POA: Insufficient documentation

## 2023-01-09 DIAGNOSIS — N183 Chronic kidney disease, stage 3 unspecified: Secondary | ICD-10-CM | POA: Insufficient documentation

## 2023-01-09 DIAGNOSIS — Z7982 Long term (current) use of aspirin: Secondary | ICD-10-CM | POA: Insufficient documentation

## 2023-01-09 DIAGNOSIS — E1122 Type 2 diabetes mellitus with diabetic chronic kidney disease: Secondary | ICD-10-CM | POA: Diagnosis not present

## 2023-01-09 DIAGNOSIS — I509 Heart failure, unspecified: Secondary | ICD-10-CM | POA: Insufficient documentation

## 2023-01-09 DIAGNOSIS — Z79899 Other long term (current) drug therapy: Secondary | ICD-10-CM | POA: Diagnosis not present

## 2023-01-09 DIAGNOSIS — I2510 Atherosclerotic heart disease of native coronary artery without angina pectoris: Secondary | ICD-10-CM | POA: Diagnosis not present

## 2023-01-09 LAB — URINALYSIS, ROUTINE W REFLEX MICROSCOPIC
Bacteria, UA: NONE SEEN
Bilirubin Urine: NEGATIVE
Glucose, UA: >=500 mg/dL — AB
Hgb urine dipstick: NEGATIVE
Ketones, ur: 5 mg/dL — AB
Nitrite: NEGATIVE
Protein, ur: NEGATIVE mg/dL
Specific Gravity, Urine: 1.026 (ref 1.005–1.030)
WBC, UA: >50 WBC/hpf (ref 0–5)
pH: 5 (ref 5.0–8.0)

## 2023-01-09 LAB — COMPREHENSIVE METABOLIC PANEL
ALT: 24 U/L (ref 0–44)
AST: 25 U/L (ref 15–41)
Albumin: 4.6 g/dL (ref 3.5–5.0)
Alkaline Phosphatase: 69 U/L (ref 38–126)
Anion gap: 9 (ref 5–15)
BUN: 45 mg/dL — ABNORMAL HIGH (ref 8–23)
CO2: 21 mmol/L — ABNORMAL LOW (ref 22–32)
Calcium: 9.5 mg/dL (ref 8.9–10.3)
Chloride: 105 mmol/L (ref 98–111)
Creatinine, Ser: 2.36 mg/dL — ABNORMAL HIGH (ref 0.44–1.00)
GFR, Estimated: 22 mL/min — ABNORMAL LOW (ref >60)
Glucose, Bld: 161 mg/dL — ABNORMAL HIGH (ref 70–99)
Potassium: 4.3 mmol/L (ref 3.5–5.1)
Sodium: 135 mmol/L (ref 135–145)
Total Bilirubin: 0.9 mg/dL (ref 0.3–1.2)
Total Protein: 8.5 g/dL — ABNORMAL HIGH (ref 6.5–8.1)

## 2023-01-09 LAB — CBC
HCT: 46.3 % — ABNORMAL HIGH (ref 36.0–46.0)
Hemoglobin: 15.2 g/dL — ABNORMAL HIGH (ref 12.0–15.0)
MCH: 30 pg (ref 26.0–34.0)
MCHC: 32.8 g/dL (ref 30.0–36.0)
MCV: 91.5 fL (ref 80.0–100.0)
Platelets: 219 10*3/uL (ref 150–400)
RBC: 5.06 MIL/uL (ref 3.87–5.11)
RDW: 12.8 % (ref 11.5–15.5)
WBC: 5.1 10*3/uL (ref 4.0–10.5)
nRBC: 0 % (ref 0.0–0.2)

## 2023-01-09 MED ORDER — AMLODIPINE BESYLATE 5 MG PO TABS: 5.0000 mg | ORAL_TABLET | Freq: Every day | ORAL | 0 refills | Status: AC

## 2023-01-09 NOTE — Discharge Instructions (Addendum)
You were given medication to help with your blood pressure, you will need to take the first dose tonight.  Tomorrow morning you may start this new medication and take that for the next several days for blood pressure control.  You will need to schedule an appointment with your cardiologist in order to obtain better management for your blood pressure and agents as you have discontinued spironolactone and lisinopril.

## 2023-01-09 NOTE — ED Triage Notes (Signed)
Pt arrived via POV. Pt referred here by Cards for elevated creatinine.  Pt has hx CKD.  Pt has no physical complaints.

## 2023-01-09 NOTE — ED Provider Notes (Signed)
[place] EMERGENCY DEPARTMENT AT [facility] Provider Note   CSN: [id number] Arrival date & time: 01/09/23  1558     History CHF,CAD,DM,CKD3 Chief Complaint  Patient presents with   abnormal labs    Brittany Evans is a 67 y.o. female.  67 year old female with a past medical history of CHF,DM, CAD, CKD3 presents to the ED for abnormal labs.  Patient reports being evaluated by cardiology yesterday, had her blood drawn, she was called approximately an hour ago, and told that her creatinine was up 2 points, she does not know what her baseline is at.  She is currently not on any dialysis.  Patient was following up with cardiology in order to change medications which they did not do yet.  She does not have any complaint at this time. Denies any dehydration, no decrease in urine output, no chest pain or sob.   The history is provided by the patient and medical records.       Home Medications Prior to Admission medications   Medication Sig Start Date End Date Taking? Authorizing Provider  amLODipine (NORVASC) 5 MG tablet Take 1 tablet (5 mg total) by mouth daily for 7 days. 01/09/23 01/16/23 Yes Maeola Mchaney, Leonie Douglas, PA-C  ADVAIR HFA 562-13 MCG/ACT inhaler Inhale 2 puffs into the lungs 2 (two) times daily as needed (sob and wheezing). 07/20/20   [provider]  albuterol (VENTOLIN HFA) 108 (90 Base) MCG/ACT inhaler Inhale 2 puffs into the lungs every 6 (six) hours as needed for wheezing or shortness of breath.    [provider]  aspirin 81 MG tablet Take 1 tablet (81 mg total) by mouth daily. 04/29/13   Alison Murray, MD  Calcium Carb-Cholecalciferol (CALCIUM 500 + D PO) Take by mouth.    [provider]  Coenzyme Q10 (COQ10) 100 MG CAPS Take by mouth.    [provider]  glipiZIDE (GLUCOTROL) 10 MG tablet Take 10 mg by mouth daily before breakfast. Patient not taking: Reported on 01/05/2023    [provider]  Iron-FA-B  Cmp-C-Biot-Probiotic (FUSION PLUS PO) Take by mouth.    [provider]  JARDIANCE 25 MG TABS tablet Take 25 mg by mouth daily. 12/22/22   [provider]  lisinopril (ZESTRIL) 10 MG tablet Take 10 mg by mouth daily.  Patient not taking: Reported on 01/05/2023 05/05/19   [provider]  metFORMIN (GLUCOPHAGE-XR) 500 MG 24 hr tablet Take 500 mg by mouth daily. Patient not taking: Reported on 01/05/2023 09/20/20   [provider]  montelukast (SINGULAIR) 10 MG tablet Take 1 tablet by mouth at bedtime. 01/14/19   [provider]  nitroGLYCERIN (NITROSTAT) 0.4 MG SL tablet Place 1 tablet (0.4 mg total) under the tongue every 5 (five) minutes as needed. If you require more than two tablets five minutes apart call 911 and go to nearest ER via EMS. 12/14/19   Tolia, Sunit, DO  simvastatin (ZOCOR) 40 MG tablet Take 1 tablet (40 mg total) by mouth at bedtime. 01/04/14   Quentin Angst, MD  spironolactone (ALDACTONE) 50 MG tablet Take 50 mg by mouth daily.    [provider]      Allergies    Trulicity [dulaglutide]    Review of Systems   Review of Systems  Constitutional:  Negative for fever.  HENT:  Negative for sore throat.   Respiratory:  Negative for shortness of breath.   Cardiovascular:  Negative for chest pain.  Gastrointestinal:  Negative  for abdominal pain, nausea and vomiting.  Genitourinary:  Negative for flank pain.  Musculoskeletal:  Negative for back pain.  All other systems reviewed and are negative.   Physical Exam Updated Vital Signs BP (!) 153/89 (BP Location: Left Arm)   Pulse 60   Temp 97.8 F (36.6 C) (Oral)   Resp 19   Ht 5\' 5"  (1.651 m)   Wt 95.3 kg   LMP  (LMP Unknown)   SpO2 99%   BMI 34.95 kg/m  Physical Exam Vitals and nursing note reviewed.  Constitutional:      Appearance: Normal appearance.  HENT:     Head: Normocephalic and atraumatic.     Nose: Nose normal.     Mouth/Throat:     Mouth: Mucous  membranes are moist.  Cardiovascular:     Rate and Rhythm: Normal rate.  Pulmonary:     Effort: Pulmonary effort is normal.  Abdominal:     General: Abdomen is flat.     Palpations: Abdomen is soft.     Tenderness: There is no abdominal tenderness.  Musculoskeletal:     Cervical back: Normal range of motion and neck supple.  Skin:    General: Skin is warm and dry.  Neurological:     Mental Status: She is alert and oriented to person, place, and time.     ED Results / Procedures / Treatments   Labs (all labs ordered are listed, but only abnormal results are displayed) Labs Reviewed  CBC - Abnormal; Notable for the following components:      Result Value   Hemoglobin 15.2 (*)    HCT 46.3 (*)    All other components within normal limits  COMPREHENSIVE METABOLIC PANEL - Abnormal; Notable for the following components:   CO2 21 (*)    Glucose, Bld 161 (*)    BUN 45 (*)    Creatinine, Ser 2.36 (*)    Total Protein 8.5 (*)    GFR, Estimated 22 (*)    All other components within normal limits  URINALYSIS, ROUTINE W REFLEX MICROSCOPIC - Abnormal; Notable for the following components:   Glucose, UA >=500 (*)    Ketones, ur 5 (*)    Leukocytes,Ua MODERATE (*)    All other components within normal limits  URINE CULTURE    EKG None  Radiology No results found.  Procedures Procedures    Medications Ordered in ED Medications - No data to display  ED Course/ Medical Decision Making/ A&P Clinical Course as of 01/09/23 1803  Fri Jan 09, 2023  1751 Glori LuisMarland Kitchen): MODERATE [JS]  1751 WBC, UA: >50 [JS]    Clinical Course User Index [JS] Claude Manges, PA-C                             Medical Decision Making Amount and/or Complexity of Data Reviewed Labs: ordered. Decision-making details documented in ED Course.  Risk Prescription drug management.    This patient presents to the ED for concern of abnormal labs, this involves a number of treatment options, and is  a complaint that carries with it a high risk of complications and morbidity.  The differential diagnosis includes AKI, worsening creatinine.   Co morbidities: Discussed in HPI   Brief History:  See HPI.   EMR reviewed including pt PMHx, past surgical history and past visits to ER.   See HPI for more details   Lab Tests:  I ordered and independently  interpreted labs.  The pertinent results include:    Labs notable for CBC with no leukocytosis, hemoglobin slightly elevated.  CMP with no electrolyte derangement, her potassium is within normal limits.  She did stop taking spironolactone today.  Creatinine level slightly elevated at 2.3, however the rest of her labs are consistent with this.  LFTs are within normal limits.  UA with some moderate leukocytes and greater than 50 white blood cells however she is not having any urinary symptoms at this time, will send this for culture.   Imaging Studies:  No imaging studies ordered for this patient   Reevaluation:  After the interventions noted above I re-evaluated patient and found that they have :improved   Social Determinants of Health:  The patient's social determinants of health were a factor in the care of this patient  Problem List / ED Course:  Patient presents to the ED with a chief complaint of abnormal labs, have blood work drawn by her cardiologist yesterday and told that her creatinine was up 2 points from her baseline.  She is here without any complaints.  Creatinine here is within her baseline although slightly elevated, potassium is normal.  She does not voice any complaints.  She did discontinue spironolactone today, prior to had discontinue lisinopril.  She is hemodynamically stable, blood pressure is 153/89, however now that she does not have any blood pressure agent, we will start her on a short course of amlodipine with appropriate cardiology follow-up.  We discussed starting this new medication.   According to  review from cardiology note yesterday looks that she was supposed to be going on spironolactone 25 mg instead of the 50 that she had been taking, however we discussed just discontinuing this medication total.  She is agreeable to plan and treatment.  No signs of hypertensive urgency versus emergency.  She is stable for discharge.   Dispostion:  After consideration of the diagnostic results and the patients response to treatment, I feel that the patent would benefit from follow-up with cardiology.     Portions of this note were generated with Scientist, clinical (histocompatibility and immunogenetics). Dictation errors may occur despite best attempts at proofreading.   Final Clinical Impression(s) / ED Diagnoses Final diagnoses:  Elevated creatine kinase level    Rx / DC Orders ED Discharge Orders          Ordered    amLODipine (NORVASC) 5 MG tablet  Daily        01/09/23 1758              Claude Manges, PA-C 01/09/23 1803    Arby Barrette, MD 01/09/23 1919

## 2023-01-10 LAB — URINE CULTURE: Culture: NO GROWTH

## 2023-01-16 ENCOUNTER — Other Ambulatory Visit: Payer: Self-pay | Admitting: Family Medicine

## 2023-01-16 ENCOUNTER — Ambulatory Visit: Payer: Medicare Other | Admitting: Cardiology

## 2023-01-16 DIAGNOSIS — Z1231 Encounter for screening mammogram for malignant neoplasm of breast: Secondary | ICD-10-CM

## 2023-02-06 ENCOUNTER — Ambulatory Visit: Payer: Medicare Other

## 2023-04-09 ENCOUNTER — Encounter: Payer: Self-pay | Admitting: Cardiology

## 2023-04-09 ENCOUNTER — Ambulatory Visit: Payer: Medicare HMO | Admitting: Cardiology

## 2023-04-09 VITALS — BP 127/79 | HR 67 | Resp 16 | Ht 65.0 in | Wt 213.0 lb

## 2023-04-09 DIAGNOSIS — I2510 Atherosclerotic heart disease of native coronary artery without angina pectoris: Secondary | ICD-10-CM

## 2023-04-09 DIAGNOSIS — E782 Mixed hyperlipidemia: Secondary | ICD-10-CM

## 2023-04-09 DIAGNOSIS — E119 Type 2 diabetes mellitus without complications: Secondary | ICD-10-CM

## 2023-04-09 DIAGNOSIS — I5032 Chronic diastolic (congestive) heart failure: Secondary | ICD-10-CM

## 2023-04-09 DIAGNOSIS — N184 Chronic kidney disease, stage 4 (severe): Secondary | ICD-10-CM

## 2023-04-09 DIAGNOSIS — I129 Hypertensive chronic kidney disease with stage 1 through stage 4 chronic kidney disease, or unspecified chronic kidney disease: Secondary | ICD-10-CM

## 2023-04-09 NOTE — Progress Notes (Signed)
Viann Fish Date of Birth: November 01, 1955 MRN: [id number] Primary Care Provider:Merino Festus Aloe, MD Former Cardiology Providers: Altamese Stonecrest, APRN, FNP-C  Primary Cardiologist: Tessa Lerner, DO, [facility] (established care 12/14/2019)  Date: 04/09/23 Last Office Visit: 01/05/2023  Chief Complaint  Patient presents with   Atherosclerosis of native coronary artery of native heart w   Follow-up    3 month   HPI  ROMIKA CREWS is a 67 y.o.  female whose past medical history and cardiovascular risk factors include: Established coronary artery disease, heart failure with improved EF, non-insulin-dependent diabetes mellitus type 2, Hypertension with CKD 4, hyperlipidemia, obesity due to excess calories, postmenopausal female.  Patient is being followed by the practice given her CAD and heart failure with improved EF.  She presents today for 76-month follow-up visit.  At the last office visit we had checked labs and due to worsening renal function and hyperkalemia she had gone to the ED for further evaluation and to correct her electrolyte imbalances.  Since then she has stopped spironolactone and lisinopril.  She has followed up with nephrologist since last office visit overall seems to be doing well given her chronic kidney disease.  Clinically she denies anginal chest pain or heart failure symptoms.  No use of sublingual nitroglycerin tablets since the last office visit.  ALLERGIES: Allergies  Allergen Reactions   Trulicity [Dulaglutide] Nausea And Vomiting   MEDICATION LIST PRIOR TO VISIT: Current Outpatient Medications on File Prior to Visit  Medication Sig Dispense Refill   amLODipine (NORVASC) 5 MG tablet Take 1 tablet (5 mg total) by mouth daily for 7 days. 7 tablet 0   aspirin 81 MG tablet Take 1 tablet (81 mg total) by mouth daily. 30 tablet 3   Calcium Carb-Cholecalciferol (CALCIUM 500 + D PO) Take by mouth.     Coenzyme Q10 (COQ10) 100 MG CAPS Take by mouth.      Iron-FA-B Cmp-C-Biot-Probiotic (FUSION PLUS PO) Take by mouth.     JARDIANCE 25 MG TABS tablet Take 25 mg by mouth daily.     LANTUS SOLOSTAR 100 UNIT/ML Solostar Pen Inject 20 Units into the skin daily.     montelukast (SINGULAIR) 10 MG tablet Take 1 tablet by mouth at bedtime.     simvastatin (ZOCOR) 40 MG tablet Take 1 tablet (40 mg total) by mouth at bedtime. 90 tablet 3   ADVAIR HFA 115-21 MCG/ACT inhaler Inhale 2 puffs into the lungs 2 (two) times daily as needed (sob and wheezing). (Patient not taking: Reported on 04/09/2023)     albuterol (VENTOLIN HFA) 108 (90 Base) MCG/ACT inhaler Inhale 2 puffs into the lungs every 6 (six) hours as needed for wheezing or shortness of breath. (Patient not taking: Reported on 04/09/2023)     nitroGLYCERIN (NITROSTAT) 0.4 MG SL tablet Place 1 tablet (0.4 mg total) under the tongue every 5 (five) minutes as needed. If you require more than two tablets five minutes apart call 911 and go to nearest ER via EMS. (Patient not taking: Reported on 04/09/2023) 10 tablet 0   No current facility-administered medications on file prior to visit.    PAST MEDICAL HISTORY: Past Medical History:  Diagnosis Date   Allergy    Anemia    low iron- has appt to see MD   Asthma    inhaler use as needed rarely   CAD (coronary artery disease)    NSTEMI 2/12: LAD 30%, D1 40%, oCFX 70-80%, mRCA 10-20%, ?occl. of apical LAD; treated medically  Cataract    CHF (congestive heart failure) (HCC)    Chronic kidney disease    stage 3 chronic kidney disease- pt states she is asymptomatic   Complication of anesthesia    Lightheaded for several days   Diabetes mellitus    DVT (deep venous thrombosis) (HCC)    left leg x2   Dysrhythmia    Hyperlipidemia    Hypertension    Myocardial infarction (HCC)    Neuromuscular disorder (HCC)    neuropathy due to DM   NICM (nonischemic cardiomyopathy) (HCC)    EF 20-25%, 45% 2012   Obesity    Peripheral vascular disease (HCC)     Systolic CHF (HCC)     PAST SURGICAL HISTORY: Past Surgical History:  Procedure Laterality Date   COLONOSCOPY     DILATATION & CURETTAGE/HYSTEROSCOPY WITH MYOSURE N/A 07/18/2014   Procedure: DILATATION & CURETTAGE/HYSTEROSCOPY WITH MYOSURE;  Surgeon: Adam Phenix, MD;  Location: [facility];  Service: Gynecology;  Laterality: N/A;   DILATATION & CURRETTAGE/HYSTEROSCOPY WITH RESECTOCOPE N/A 04/12/2015   Procedure:  DIAGNOSTIC HYSTEROSCOPY WITH RESECTION OF FIBROID AND POLYPS;  Surgeon: Maxie Better, MD;  Location: [facility];  Service: Gynecology;  Laterality: N/A;   DILATION AND CURETTAGE OF UTERUS N/A 04/12/2015   Procedure: DILATATION AND CURETTAGE;  Surgeon: Maxie Better, MD;  Location: [facility];  Service: Gynecology;  Laterality: N/A;   None     POLYPECTOMY N/A 07/18/2014   Procedure: Vaginal POLYPECTOMY;  Surgeon: Adam Phenix, MD;  Location: [facility];  Service: Gynecology;  Laterality: N/A;   WISDOM TOOTH EXTRACTION      FAMILY HISTORY: The patient's family history includes Alcohol abuse in her father; Breast cancer in her cousin; Cancer in her mother and sister; Cirrhosis in her father; Coronary artery disease in her mother; Diabetes in her mother; Heart disease in her father and mother; Lung disease in her sister; Pancreatic cancer in her maternal grandmother; Rectal cancer in her mother.   SOCIAL HISTORY:  The patient  reports that she has never smoked. She has never used smokeless tobacco. She reports that she does not drink alcohol and does not use drugs.  Review of Systems  Cardiovascular:  Negative for chest pain, dyspnea on exertion, leg swelling, near-syncope, orthopnea, palpitations, paroxysmal nocturnal dyspnea and syncope.  Respiratory:  Negative for shortness of breath.     PHYSICAL EXAM:    04/09/2023   10:50 AM 01/09/2023    5:05 PM 01/09/2023    4:08 PM  Vitals with BMI  Height 5\' 5"   5\' 5"   Weight 213 lbs  210 lbs  BMI 35.44  34.95  Systolic 127     Diastolic 79    Pulse 67 60    Physical Exam  Constitutional: No distress.  Age appropriate, hemodynamically stable.   Neck: No JVD present.  Cardiovascular: Normal rate, regular rhythm, S1 normal, S2 normal, intact distal pulses and normal pulses. Exam reveals no gallop, no S3 and no S4.  No murmur heard. Pulmonary/Chest: Effort normal and breath sounds normal. No stridor. She has no wheezes. She has no rales.  Abdominal: Soft. Bowel sounds are normal. She exhibits no distension. There is no abdominal tenderness.  Musculoskeletal:        General: No edema.     Cervical back: Neck supple.  Neurological: She is alert and oriented to person, place, and time. She has intact cranial nerves (2-12).  Skin: Skin is warm and moist.   CARDIAC DATABASE: EKG: Jan 05, 2023: Sinus rhythm, 73 bpm, nonspecific T wave abnormality, without underlying injury pattern.  Compared to 01/16/2022 lateral TWIs are no longer present.  Echocardiogram: 12/2010: LVEF 45-50%, diffuse global hypokinesis per report.  07/24/2020: Left ventricle cavity is normal in size. Mild concentric hypertrophy of the left ventricle. Normal global wall motion. LVEF 55-60%. Doppler evidence of grade I (impaired) diastolic dysfunction, normal LAP. Calculated EF 60%. Left atrial cavity is mildly dilated. Mild (Grade I) mitral regurgitation. Mild tricuspid regurgitation. No evidence of pulmonary hypertension. No significant change compared to previous study on 09/02/2017.  01/27/2022:  Left ventricle cavity is normal in size. Mild concentric hypertrophy of the left ventricle. Normal global wall motion. Normal LV systolic function with EF 58%. Doppler evidence of grade I (impaired) diastolic dysfunction, normal LAP.  Mild (Grade I) mitral regurgitation.  Mild tricuspid regurgitation.  No evidence of pulmonary hypertension.  No significant change compared to previous study on 07/24/2020.   Stress Testing:  Lexiscan (Walking with mod  Bruce) Sestamibi Stress Test 06/25/2020: Non-diagnostic ECG stress. Mild soft tissue attenuation in inferior wall. A small sized reversible mild defect in the inferior region cannot be excluded.  The LV is normal in size in both rest and stress images. Overall LV systolic function is abnormal without regional wall motion abnormalities. Global hypokinesis of the left ventricle. Stress LV EF: 39%. Findings may represent non ischemic cardiomyopathy.  No previous exam available for comparison. Intermediate risk due to low LVEF. Marland Kitchen Clinical correlation recommended.   Heart Catheterization: Coronary angiogram 2/12: LAD 30%, D1 40%, oCFX 70-80%, mRCA 10-20%, ?occl. of apical LAD; Medical therapy. LVEF 30%  LABORATORY DATA:    Latest Ref Rng & Units 01/09/2023    5:00 PM 04/07/2022    9:12 AM 10/07/2021   12:02 PM  CBC  WBC 4.0 - 10.5 K/uL 5.1  3.9  5.4   Hemoglobin 12.0 - 15.0 g/dL 11.9  14.7  82.9   Hematocrit 36.0 - 46.0 % 46.3  38.3  41.5   Platelets 150 - 400 K/uL 219  198  131        Latest Ref Rng & Units 01/09/2023    5:00 PM 01/07/2023    9:34 AM 01/04/2021    9:24 AM  CMP  Glucose 70 - 99 mg/dL 562  130  865   BUN 8 - 23 mg/dL 45  36  26   Creatinine 0.44 - 1.00 mg/dL 7.84  6.96  2.95   Sodium 135 - 145 mmol/L 135  138  138   Potassium 3.5 - 5.1 mmol/L 4.3  5.3  4.2   Chloride 98 - 111 mmol/L 105  101  107   CO2 22 - 32 mmol/L 21  20  21    Calcium 8.9 - 10.3 mg/dL 9.5  28.4  13.2   Total Protein 6.5 - 8.1 g/dL 8.5  7.6  7.4   Total Bilirubin 0.3 - 1.2 mg/dL 0.9  0.7  0.7   Alkaline Phos 38 - 126 U/L 69  84  82   AST 15 - 41 U/L 25  19  18    ALT 0 - 44 U/L 24  25  16      Lipid Panel     Component Value Date/Time   CHOL 189 01/07/2023 0934   TRIG 95 01/07/2023 0934   HDL 71 01/07/2023 0934   CHOLHDL 2.6 06/14/2013 1357   VLDL 22 06/14/2013 1357   LDLCALC 101 (H) 01/07/2023 0934   LDLDIRECT 102 (H)  01/07/2023 0934   LABVLDL 17 01/07/2023 0934    Lab Results  Component  Value Date   HGBA1C 7.2 01/02/2014   HGBA1C 7.4 09/29/2013   HGBA1C 7.4 06/14/2013   No components found for: "NTPROBNP" Lab Results  Component Value Date   TSH 1.991 03/11/2013   TSH 1.602 05/03/2011   TSH 1.962 10/02/2010   External Labs: Collected: 10/11/2021 available in epic. BUN 31, creatinine 1.74. eGFR 32 mL/min per 1.73 m. Sodium 138, potassium 5, chloride 105, bicarb 23  External Labs: Collected: December 05, 2022 [facility] BUN 34, creatinine 1.99. Serum glucose 266. eGFR 27 mL/min per 1.73 m. Sodium 136, potassium 4.5, chloride 101, bicarb 24 Hemoglobin A1c 7.8%   FINAL MEDICATION LIST END OF ENCOUNTER: No orders of the defined types were placed in this encounter.     Current Outpatient Medications:    amLODipine (NORVASC) 5 MG tablet, Take 1 tablet (5 mg total) by mouth daily for 7 days., Disp: 7 tablet, Rfl: 0   aspirin 81 MG tablet, Take 1 tablet (81 mg total) by mouth daily., Disp: 30 tablet, Rfl: 3   Calcium Carb-Cholecalciferol (CALCIUM 500 + D PO), Take by mouth., Disp: , Rfl:    Coenzyme Q10 (COQ10) 100 MG CAPS, Take by mouth., Disp: , Rfl:    Iron-FA-B Cmp-C-Biot-Probiotic (FUSION PLUS PO), Take by mouth., Disp: , Rfl:    JARDIANCE 25 MG TABS tablet, Take 25 mg by mouth daily., Disp: , Rfl:    LANTUS SOLOSTAR 100 UNIT/ML Solostar Pen, Inject 20 Units into the skin daily., Disp: , Rfl:    montelukast (SINGULAIR) 10 MG tablet, Take 1 tablet by mouth at bedtime., Disp: , Rfl:    simvastatin (ZOCOR) 40 MG tablet, Take 1 tablet (40 mg total) by mouth at bedtime., Disp: 90 tablet, Rfl: 3   ADVAIR HFA 115-21 MCG/ACT inhaler, Inhale 2 puffs into the lungs 2 (two) times daily as needed (sob and wheezing). (Patient not taking: Reported on 04/09/2023), Disp: , Rfl:    albuterol (VENTOLIN HFA) 108 (90 Base) MCG/ACT inhaler, Inhale 2 puffs into the lungs every 6 (six) hours as needed for wheezing or shortness of breath. (Patient not taking: Reported on 04/09/2023),  Disp: , Rfl:    nitroGLYCERIN (NITROSTAT) 0.4 MG SL tablet, Place 1 tablet (0.4 mg total) under the tongue every 5 (five) minutes as needed. If you require more than two tablets five minutes apart call 911 and go to nearest ER via EMS. (Patient not taking: Reported on 04/09/2023), Disp: 10 tablet, Rfl: 0  IMPRESSION:    ICD-10-CM   1. Atherosclerosis of native coronary artery of native heart without angina pectoris  I25.10 Lipid Panel With LDL/HDL Ratio    LDL cholesterol, direct    CMP14+EGFR    2. Heart failure with improved ejection fraction (HFimpEF) (HCC)  I50.32 Lipid Panel With LDL/HDL Ratio    LDL cholesterol, direct    CMP14+EGFR    3. Benign hypertensive CKD, stage 1-4 or unspecified chronic kidney disease  I12.9     4. Chronic kidney disease, stage IV (severe) (HCC)  N18.4     5. Non-insulin dependent type 2 diabetes mellitus (HCC)  E11.9 Lipid Panel With LDL/HDL Ratio    LDL cholesterol, direct    CMP14+EGFR    6. Mixed hyperlipidemia  E78.2 Lipid Panel With LDL/HDL Ratio    LDL cholesterol, direct    CMP14+EGFR    7. Class 2 severe obesity due to excess calories with serious comorbidity  and body mass index (BMI) of 36.0 to 36.9 in adult [facility])  E66.01    Z68.36         RECOMMENDATIONS: ISOLDE URDANETA is a 67 y.o. female whose past medical history and cardiovascular risk factors include: Established coronary artery disease, heart failure with improved EF, non-insulin-dependent diabetes mellitus type 2, Hypertension with CKD 4, hyperlipidemia, obesity due to excess calories, postmenopausal female.  Atherosclerosis of native coronary artery of native heart without angina pectoris Denies angina pectoris. No use of sublingual nitroglycerin tablets since last office visit. Left heart catheterization February 2012 noted to have CAD and LVEF reported to be 30%. With increase in GDMT her LVEF has improved and clinically doing well.  Medications  reconciled.  HFimpEF Noted to have an LVEF of 30% per angiography report from February 2012. LVEF reported to be 55-60% as of December 2021.  Medication reconciled.  Holding ACEi and Aldactone due to concerns for hyperkalemia and progression of CKD. Already had ED visit in May 2024.  Reemphasized the importance of secondary prevention with focus on improving her modifiable cardiovascular risk factors such as glycemic control, lipid management, blood pressure control, weight loss.  Benign hypertension with chronic kidney disease stage IV Office blood pressures are well controlled. Off of ACE inhibitor's and spironolactone for reasons mentioned above. Started on amlodipine 5 mg p.o. daily. Follows with nephrology given her chronic kidney disease stage IV.  Non-insulin dependent type 2 diabetes mellitus (HCC) Most recent hemoglobin A1c 7.8. Currently managed by primary care provider. Given her renal function recommend holding metformin and considering other agents to improve glycemic control  Mixed hyperlipidemia Currently on simvastatin.   Labs from May 2024 notes LDL not being at goal.  Given her diabetes, heart failure with improved EF, and CAD would recommend an LDL of at least <70 mg/dL and if able less than 55 mg/dL.  Patient states that since May 2024 she has implemented significant amount of lifestyle changes, increase physical activity, and is very hopeful that her lipids have improved.  Shared decision is to repeat lipids and based on the results we will discuss further management.  Class 2 severe obesity due to excess calories with serious comorbidity and body mass index (BMI) of 35.0 to 35.9 in adult [facility]) Body mass index is 35.45 kg/m. I reviewed with the patient the importance of diet, regular physical activity/exercise, weight loss.   Patient is educated on increasing physical activity gradually as tolerated.  With the goal of moderate intensity exercise for 30 minutes a day  5 days a week.  Orders Placed This Encounter  Procedures   Lipid Panel With LDL/HDL Ratio   LDL cholesterol, direct   [id number]   --Continue cardiac medications as reconciled in final medication list. --Return in about 6 months (around 10/10/2023) for Follow up HFimpEF, CKD, HLD. Or sooner if needed. --Continue follow-up with your primary care physician regarding the management of your other chronic comorbid conditions.  Patient's questions and concerns were addressed to her satisfaction. She voices understanding of the instructions provided during this encounter.   This note was created using a voice recognition software as a result there may be grammatical errors inadvertently enclosed that do not reflect the nature of this encounter. Every attempt is made to correct such errors.  Tessa Lerner, [place], [facility]  Pager:  [phone] Office: [phone]

## 2023-04-24 LAB — CMP14+EGFR
ALT: 21 IU/L (ref 0–32)
AST: 22 IU/L (ref 0–40)
Albumin: 4.3 g/dL (ref 3.9–4.9)
Alkaline Phosphatase: 88 IU/L (ref 44–121)
BUN/Creatinine Ratio: 12 (ref 12–28)
BUN: 16 mg/dL (ref 8–27)
Bilirubin Total: 0.7 mg/dL (ref 0.0–1.2)
CO2: 22 mmol/L (ref 20–29)
Calcium: 9.6 mg/dL (ref 8.7–10.3)
Chloride: 104 mmol/L (ref 96–106)
Creatinine, Ser: 1.39 mg/dL — ABNORMAL HIGH (ref 0.57–1.00)
Globulin, Total: 2.5 g/dL (ref 1.5–4.5)
Glucose: 111 mg/dL — ABNORMAL HIGH (ref 70–99)
Potassium: 4.4 mmol/L (ref 3.5–5.2)
Sodium: 141 mmol/L (ref 134–144)
Total Protein: 6.8 g/dL (ref 6.0–8.5)
eGFR: 42 mL/min/{1.73_m2} — ABNORMAL LOW (ref >59)

## 2023-04-24 LAB — LIPID PANEL WITH LDL/HDL RATIO
Cholesterol, Total: 178 mg/dL (ref 100–199)
HDL: 67 mg/dL (ref >39)
LDL Chol Calc (NIH): 94 mg/dL (ref 0–99)
LDL/HDL Ratio: 1.4 ratio (ref 0.0–3.2)
Triglycerides: 93 mg/dL (ref 0–149)
VLDL Cholesterol Cal: 17 mg/dL (ref 5–40)

## 2023-04-24 LAB — LDL CHOLESTEROL, DIRECT: LDL Direct: 100 mg/dL — ABNORMAL HIGH (ref 0–99)

## 2023-04-27 ENCOUNTER — Other Ambulatory Visit: Payer: Self-pay

## 2023-04-27 DIAGNOSIS — I129 Hypertensive chronic kidney disease with stage 1 through stage 4 chronic kidney disease, or unspecified chronic kidney disease: Secondary | ICD-10-CM

## 2023-04-27 DIAGNOSIS — E782 Mixed hyperlipidemia: Secondary | ICD-10-CM

## 2023-04-27 NOTE — Progress Notes (Signed)
Called patient no answer left a vm  Labs has been ordered and release.

## 2023-04-27 NOTE — Progress Notes (Signed)
Called patient and patient understands

## 2023-06-12 ENCOUNTER — Telehealth: Payer: Self-pay | Admitting: Cardiology

## 2023-06-12 DIAGNOSIS — E782 Mixed hyperlipidemia: Secondary | ICD-10-CM

## 2023-06-12 NOTE — Telephone Encounter (Signed)
Pt c/o medication issue:  1. Name of Medication: Nexlizet  2. How are you currently taking this medication (dosage and times per day)?   3. Are you having a reaction (difficulty breathing--STAT)?   4. What is your medication issue? Pt is calling back to let Dr Odis Hollingshead she has not started this medication yet cause it was not called into her pharmacy. She needs to know what she need to do

## 2023-06-15 NOTE — Telephone Encounter (Signed)
Nexlizet 180/10 mg needs to be sent into pharmacy.  Left message for pt to call back with name of pharmacy she would like RX sent to.  She will also need lab in 6 weeks (lipid?CMP) orders in system.

## 2023-06-17 MED ORDER — NEXLIZET 180-10 MG PO TABS
1.0000 | ORAL_TABLET | Freq: Every day | ORAL | 2 refills | Status: DC
  Filled 2023-07-01: qty 30, 30d supply, fill #0

## 2023-06-17 NOTE — Telephone Encounter (Signed)
[facility] [phone] - [place], [place] - [address] Phone: [phone]  Fax: [phone]      Pt called back and said this is the correct pharmacy to send script

## 2023-06-17 NOTE — Telephone Encounter (Signed)
Left message for patient informing her Rx for Nexlizet 180-10 mg daily has been sent to [facility].  In message, informed patient she will need to have fasting labs drawn 6 weeks after starting Nexlizet (CMP and Lipid panel--orders already in Epic). This would be due around the week of August 03, 2023.  Provided office number for callback if any questions.

## 2023-06-19 ENCOUNTER — Telehealth: Payer: Self-pay

## 2023-06-19 ENCOUNTER — Telehealth: Payer: Self-pay | Admitting: Cardiology

## 2023-06-19 ENCOUNTER — Other Ambulatory Visit (HOSPITAL_COMMUNITY): Payer: Self-pay

## 2023-06-19 NOTE — Telephone Encounter (Signed)
Pharmacy Patient Advocate Encounter   Received notification from Physician's Office that prior authorization for NEXLIZET is required/requested.   Insurance verification completed.   The patient is insured through [employer] .   Per test claim: PA required; PA submitted to above mentioned insurance via CoverMyMeds Key/confirmation #/EOC [id number] Status is pending

## 2023-06-19 NOTE — Telephone Encounter (Signed)
Pt c/o medication issue:  1. Name of Medication: Nexlizet 180-10 mg   2. How are you currently taking this medication (dosage and times per day)? New medication  3. Are you having a reaction (difficulty breathing--STAT)? No   4. What is your medication issue? Patient cannot afford medication due to expense being over $400. Want to know if she can get assistance with the medication or get a generic brand to help with cost

## 2023-06-19 NOTE — Telephone Encounter (Signed)
Per test claim, drug needed prior authorization. PA request has been Submitted. New Encounter created for follow up. For additional info see Pharmacy Prior Auth telephone encounter from 06/19/23.

## 2023-06-19 NOTE — Telephone Encounter (Signed)
Pharmacy Patient Advocate Encounter  Received notification from [facility] that Prior Authorization for NEXLIZET has been APPROVED from 08/18/22 to 08/17/24. Ran test claim, Copay is $57.64. This test claim was processed through [facility]- copay amounts may vary at other pharmacies due to pharmacy/plan contracts, or as the patient moves through the different stages of their insurance plan.

## 2023-07-01 ENCOUNTER — Other Ambulatory Visit (HOSPITAL_COMMUNITY): Payer: Self-pay

## 2023-10-09 ENCOUNTER — Ambulatory Visit: Payer: Self-pay | Admitting: Cardiology

## 2023-10-26 ENCOUNTER — Encounter: Payer: Self-pay | Admitting: Cardiology

## 2023-10-26 ENCOUNTER — Ambulatory Visit: Payer: Medicare Other | Attending: Cardiology | Admitting: Cardiology

## 2023-10-26 VITALS — BP 138/86 | HR 71 | Resp 16 | Ht 65.0 in | Wt 206.0 lb

## 2023-10-26 DIAGNOSIS — Z6834 Body mass index (BMI) 34.0-34.9, adult: Secondary | ICD-10-CM

## 2023-10-26 DIAGNOSIS — E1165 Type 2 diabetes mellitus with hyperglycemia: Secondary | ICD-10-CM | POA: Diagnosis not present

## 2023-10-26 DIAGNOSIS — E6609 Other obesity due to excess calories: Secondary | ICD-10-CM

## 2023-10-26 DIAGNOSIS — I2510 Atherosclerotic heart disease of native coronary artery without angina pectoris: Secondary | ICD-10-CM | POA: Diagnosis not present

## 2023-10-26 DIAGNOSIS — E782 Mixed hyperlipidemia: Secondary | ICD-10-CM

## 2023-10-26 DIAGNOSIS — I129 Hypertensive chronic kidney disease with stage 1 through stage 4 chronic kidney disease, or unspecified chronic kidney disease: Secondary | ICD-10-CM | POA: Diagnosis not present

## 2023-10-26 DIAGNOSIS — I5032 Chronic diastolic (congestive) heart failure: Secondary | ICD-10-CM | POA: Diagnosis not present

## 2023-10-26 DIAGNOSIS — Z794 Long term (current) use of insulin: Secondary | ICD-10-CM

## 2023-10-26 DIAGNOSIS — E66811 Obesity, class 1: Secondary | ICD-10-CM

## 2023-10-26 NOTE — Progress Notes (Unsigned)
 Cardiology Office Note:  .   Date:  10/26/2023  ID:  Brittany Evans, DOB [birth year], MRN [id number] PCP:  Corliss Blacker, MD  Former Cardiology Providers: Altamese Mazeppa, APRN, FNP-C  [facility] Providers Cardiologist:  Tessa Lerner, DO , [facility] (established care 12/14/2019 ) Electrophysiologist:  None  Click to update primary MD,subspecialty MD or APP then REFRESH:1}    Chief Complaint  Patient presents with   Atherosclerosis of native coronary artery of native heart w    History of Present Illness: .   Brittany Evans is a 68 y.o.  female whose past medical history and cardiovascular risk factors includes: Established coronary artery disease, heart failure with improved EF, non-insulin-dependent diabetes mellitus type 2, Hypertension with CKD 4, hyperlipidemia, obesity due to excess calories, postmenopausal female.   Patient being followed with the practice given her history of heart failure with improved EF and CAD.  Patient presents today for 34-month follow-up visit.  In the past due to worsening renal function and hyperkalemia spironolactone and lisinopril were held.  She has establish care with nephrology.  At the last office visit her lipids were checked LDL was 100 mg/dL, recommended adding Nexlizet.  Patient states that she was taking it for a week but was not able to tolerated due to lightheaded and dizziness and therefore has stopped it.  She had repeat labs with [facility] and they were sent to PCP for further reference and guidance.     Review of Systems: .   ROS  Studies Reviewed:   EKG: EKG Interpretation Date/Time:  Monday October 26 2023 10:15:01 EDT Ventricular Rate:  70 PR Interval:  148 QRS Duration:  80 QT Interval:  406 QTC Calculation: 438 R Axis:   5  Text Interpretation: Normal sinus rhythm with sinus arrhythmia Low voltage QRS Nonspecific T wave abnormality When compared with ECG of 07-Aug-2020 15:43, No significant change since  last tracing Confirmed by Tessa Lerner [phone]) on 10/26/2023 10:36:47 AM  Echocardiogram: 12/2010: LVEF 45-50%, diffuse global hypokinesis per report.   07/24/2020: LVEF 55 to 60%, grade 1 diastolic dysfunction, mild MR/mild TR, see report for additional details   01/27/2022: LVEF 58%, grade 1 diastolic dysfunction, mild MR/mild TR.  See report for additional details   Stress Testing:  Lexiscan (Walking with mod Bruce) Sestamibi Stress Test 06/25/2020: Low/intermediate risk   Heart Catheterization: Coronary angiogram 2/12: LAD 30%, D1 40%, oCFX 70-80%, mRCA 10-20%, ?occl. of apical LAD; Medical therapy. LVEF 30%  RADIOLOGY: NA  Risk Assessment/Calculations:   NA   Labs:       Latest Ref Rng & Units 01/09/2023    5:00 PM 04/07/2022    9:12 AM 10/07/2021   12:02 PM  CBC  WBC 4.0 - 10.5 K/uL 5.1  3.9  5.4   Hemoglobin 12.0 - 15.0 g/dL 32.4  40.1  02.7   Hematocrit 36.0 - 46.0 % 46.3  38.3  41.5   Platelets 150 - 400 K/uL 219  198  131        Latest Ref Rng & Units 04/23/2023   11:36 AM 01/09/2023    5:00 PM 01/07/2023    9:34 AM  BMP  Glucose 70 - 99 mg/dL 253  664  403   BUN 8 - 27 mg/dL 16  45  36   Creatinine 0.57 - 1.00 mg/dL 4.74  2.59  5.63   BUN/Creat Ratio 12 - 28 12   16    Sodium 134 - 144 mmol/L  141  135  138   Potassium 3.5 - 5.2 mmol/L 4.4  4.3  5.3   Chloride 96 - 106 mmol/L 104  105  101   CO2 20 - 29 mmol/L 22  21  20    Calcium 8.7 - 10.3 mg/dL 9.6  9.5  60.4       Latest Ref Rng & Units 04/23/2023   11:36 AM 01/09/2023    5:00 PM 01/07/2023    9:34 AM  CMP  Glucose 70 - 99 mg/dL 540  981  191   BUN 8 - 27 mg/dL 16  45  36   Creatinine 0.57 - 1.00 mg/dL 4.78  2.95  6.21   Sodium 134 - 144 mmol/L 141  135  138   Potassium 3.5 - 5.2 mmol/L 4.4  4.3  5.3   Chloride 96 - 106 mmol/L 104  105  101   CO2 20 - 29 mmol/L 22  21  20    Calcium 8.7 - 10.3 mg/dL 9.6  9.5  30.8   Total Protein 6.0 - 8.5 g/dL 6.8  8.5  7.6   Total Bilirubin 0.0 - 1.2 mg/dL 0.7  0.9   0.7   Alkaline Phos 44 - 121 IU/L 88  69  84   AST 0 - 40 IU/L 22  25  19    ALT 0 - 32 IU/L 21  24  25      Lab Results  Component Value Date   CHOL 178 04/23/2023   HDL 67 04/23/2023   LDLCALC 94 04/23/2023   LDLDIRECT 100 (H) 04/23/2023   TRIG 93 04/23/2023   CHOLHDL 2.6 06/14/2013   No results for input(s): "LIPOA" in the last 8760 hours. No components found for: "NTPROBNP" No results for input(s): "PROBNP" in the last 8760 hours. No results for input(s): "TSH" in the last 8760 hours.  External Labs: Collected: 09/24/2023 [facility] database. Total cholesterol 173, triglycerides 80, HDL 69, LDL calculated 89. BUN 19, creatinine 1.24. eGFR 48. Potassium 3.9 A1c 7.9. Hemoglobin 15.6 Urine to protein creatinine ratio 241, above normal limits  Physical Exam:    Today's Vitals   10/26/23 1015  BP: 138/86  Pulse: 71  Resp: 16  SpO2: 95%  Weight: 206 lb (93.4 kg)  Height: 5\' 5"  (1.651 m)   Body mass index is 34.28 kg/m. Wt Readings from Last 3 Encounters:  10/26/23 206 lb (93.4 kg)  04/09/23 213 lb (96.6 kg)  01/09/23 210 lb (95.3 kg)    Physical Exam   Impression & Recommendation(s):  Impression:   ICD-10-CM   1. Mixed hyperlipidemia  E78.2 EKG 12-Lead       Recommendation(s):  ***  Recommend restarting either the lisinopril or losartan given her diabetes, urine protein, and other comorbid conditions.  She will talk to her PCP later today with regards to initiation with close monitoring of renal function.  Orders Placed:  Orders Placed This Encounter  Procedures   EKG 12-Lead    As part of medical decision making ***  Final Medication List:   No orders of the defined types were placed in this encounter.   Medications Discontinued During This Encounter  Medication Reason   ADVAIR HFA 115-21 MCG/ACT inhaler Patient Preference   Bempedoic Acid-Ezetimibe (NEXLIZET) 180-10 MG TABS Side effect (s)   JARDIANCE 25 MG TABS tablet Change in therapy    albuterol (VENTOLIN HFA) 108 (90 Base) MCG/ACT inhaler Patient Preference     Current Outpatient Medications:    amLODipine (  NORVASC) 5 MG tablet, Take 1 tablet (5 mg total) by mouth daily for 7 days., Disp: 7 tablet, Rfl: 0   aspirin 81 MG tablet, Take 1 tablet (81 mg total) by mouth daily., Disp: 30 tablet, Rfl: 3   Calcium Carb-Cholecalciferol (CALCIUM 500 + D PO), Take by mouth., Disp: , Rfl:    Coenzyme Q10 (COQ10) 100 MG CAPS, Take by mouth., Disp: , Rfl:    dapagliflozin propanediol (FARXIGA) 10 MG TABS tablet, Take 10 mg by mouth daily., Disp: , Rfl:    Iron-FA-B Cmp-C-Biot-Probiotic (FUSION PLUS PO), Take by mouth., Disp: , Rfl:    LANTUS SOLOSTAR 100 UNIT/ML Solostar Pen, Inject 20 Units into the skin daily., Disp: , Rfl:    montelukast (SINGULAIR) 10 MG tablet, Take 1 tablet by mouth at bedtime., Disp: , Rfl:    nitroGLYCERIN (NITROSTAT) 0.4 MG SL tablet, Place 1 tablet (0.4 mg total) under the tongue every 5 (five) minutes as needed. If you require more than two tablets five minutes apart call 911 and go to nearest ER via EMS., Disp: 10 tablet, Rfl: 0   simvastatin (ZOCOR) 40 MG tablet, Take 1 tablet (40 mg total) by mouth at bedtime., Disp: 90 tablet, Rfl: 3  Consent:   ***  Disposition:   *** Patient may be asked to follow-up sooner based on the results of the above-mentioned testing.  Her questions and concerns were addressed to her satisfaction. She voices understanding of the recommendations provided during this encounter.    Signed, Tessa Lerner, DO, [facility]  [facility]  [address] [place], [place] [address] 10/26/2023 10:36 AM

## 2023-10-26 NOTE — Patient Instructions (Signed)
 Medication Instructions:  Your physician recommends that you continue on your current medications as directed. Please refer to the Current Medication list given to you today.  *If you need a refill on your cardiac medications before your next appointment, please call your pharmacy*  Lab Work: None ordered today. If you have labs (blood work) drawn today and your tests are completely normal, you will receive your results only by: MyChart Message (if you have MyChart) OR A paper copy in the mail If you have any lab test that is abnormal or we need to change your treatment, we will call you to review the results.  Testing/Procedures: None ordered today.  Follow-Up: At [facility], you and your health needs are our priority.  As part of our continuing mission to provide you with exceptional heart care, we have created designated Provider Care Teams.  These Care Teams include your primary Cardiologist (physician) and Advanced Practice Providers (APPs -  Physician Assistants and Nurse Practitioners) who all work together to provide you with the care you need, when you need it.  We recommend signing up for the patient portal called "MyChart".  Sign up information is provided on this After Visit Summary.  MyChart is used to connect with patients for Virtual Visits (Telemedicine).  Patients are able to view lab/test results, encounter notes, upcoming appointments, etc.  Non-urgent messages can be sent to your provider as well.   To learn more about what you can do with MyChart, go to [email or web address].    Your next appointment:   1 year(s)  The format for your next appointment:   In Person  Provider:   Tessa Lerner, DO {

## 2024-11-14 ENCOUNTER — Ambulatory Visit: Payer: Self-pay | Admitting: Cardiology
